# Patient Record
Sex: Male | Born: 1935 | Race: White | Hispanic: No | Marital: Married | Smoking: Current every day smoker
Health system: Southern US, Community
[De-identification: ages and names within clinical notes are randomized; demographics above are authoritative.]

## PROBLEM LIST (undated history)

## (undated) DIAGNOSIS — Z8619 Personal history of other infectious and parasitic diseases: Secondary | ICD-10-CM

## (undated) DIAGNOSIS — Z860100 Personal history of colon polyps, unspecified: Secondary | ICD-10-CM

## (undated) DIAGNOSIS — I10 Essential (primary) hypertension: Secondary | ICD-10-CM

## (undated) DIAGNOSIS — M1990 Unspecified osteoarthritis, unspecified site: Secondary | ICD-10-CM

## (undated) DIAGNOSIS — Z9221 Personal history of antineoplastic chemotherapy: Secondary | ICD-10-CM

## (undated) DIAGNOSIS — K5730 Diverticulosis of large intestine without perforation or abscess without bleeding: Secondary | ICD-10-CM

## (undated) DIAGNOSIS — N400 Enlarged prostate without lower urinary tract symptoms: Secondary | ICD-10-CM

## (undated) DIAGNOSIS — E7800 Pure hypercholesterolemia, unspecified: Secondary | ICD-10-CM

## (undated) DIAGNOSIS — M545 Low back pain, unspecified: Secondary | ICD-10-CM

## (undated) DIAGNOSIS — R7303 Prediabetes: Secondary | ICD-10-CM

## (undated) DIAGNOSIS — R252 Cramp and spasm: Secondary | ICD-10-CM

## (undated) DIAGNOSIS — Z85828 Personal history of other malignant neoplasm of skin: Secondary | ICD-10-CM

## (undated) DIAGNOSIS — I872 Venous insufficiency (chronic) (peripheral): Secondary | ICD-10-CM

## (undated) DIAGNOSIS — J449 Chronic obstructive pulmonary disease, unspecified: Secondary | ICD-10-CM

## (undated) DIAGNOSIS — F17210 Nicotine dependence, cigarettes, uncomplicated: Secondary | ICD-10-CM

## (undated) DIAGNOSIS — G4733 Obstructive sleep apnea (adult) (pediatric): Secondary | ICD-10-CM

## (undated) HISTORY — DX: Prediabetes: R73.03

## (undated) HISTORY — DX: Low back pain, unspecified: M54.50

## (undated) HISTORY — DX: Low back pain: M54.5

## (undated) HISTORY — DX: Chronic obstructive pulmonary disease, unspecified: J44.9

## (undated) HISTORY — DX: Obstructive sleep apnea (adult) (pediatric): G47.33

## (undated) HISTORY — DX: Venous insufficiency (chronic) (peripheral): I87.2

## (undated) HISTORY — DX: Essential (primary) hypertension: I10

## (undated) HISTORY — DX: Nicotine dependence, cigarettes, uncomplicated: F17.210

## (undated) HISTORY — DX: Personal history of other malignant neoplasm of skin: Z85.828

## (undated) HISTORY — DX: Unspecified osteoarthritis, unspecified site: M19.90

## (undated) HISTORY — DX: Personal history of antineoplastic chemotherapy: Z92.21

## (undated) HISTORY — DX: Personal history of colonic polyps: Z86.010

## (undated) HISTORY — PX: APPENDECTOMY: SHX54

## (undated) HISTORY — DX: Benign prostatic hyperplasia without lower urinary tract symptoms: N40.0

## (undated) HISTORY — DX: Personal history of colon polyps, unspecified: Z86.0100

## (undated) HISTORY — DX: Cramp and spasm: R25.2

## (undated) HISTORY — PX: TONSILLECTOMY AND ADENOIDECTOMY: SUR1326

## (undated) HISTORY — DX: Pure hypercholesterolemia, unspecified: E78.00

## (undated) HISTORY — DX: Diverticulosis of large intestine without perforation or abscess without bleeding: K57.30

## (undated) HISTORY — DX: Personal history of other infectious and parasitic diseases: Z86.19

---

## 1995-12-17 HISTORY — PX: LUMBAR LAMINECTOMY: SHX95

## 1996-12-16 HISTORY — PX: OTHER SURGICAL HISTORY: SHX169

## 2000-03-02 ENCOUNTER — Ambulatory Visit: Admission: RE | Admit: 2000-03-02 | Discharge: 2000-03-02 | Payer: Self-pay | Admitting: Pulmonary Disease

## 2000-03-24 ENCOUNTER — Encounter: Admission: RE | Admit: 2000-03-24 | Discharge: 2000-06-22 | Payer: Self-pay | Admitting: Pulmonary Disease

## 2004-10-26 ENCOUNTER — Ambulatory Visit: Payer: Self-pay | Admitting: Pulmonary Disease

## 2004-11-02 ENCOUNTER — Ambulatory Visit: Payer: Self-pay | Admitting: Pulmonary Disease

## 2005-03-05 ENCOUNTER — Ambulatory Visit: Payer: Self-pay | Admitting: Pulmonary Disease

## 2005-03-07 ENCOUNTER — Ambulatory Visit: Payer: Self-pay | Admitting: Pulmonary Disease

## 2005-05-17 ENCOUNTER — Ambulatory Visit: Payer: Self-pay | Admitting: Pulmonary Disease

## 2005-09-05 ENCOUNTER — Ambulatory Visit: Payer: Self-pay | Admitting: Pulmonary Disease

## 2005-11-27 ENCOUNTER — Ambulatory Visit: Payer: Self-pay | Admitting: Pulmonary Disease

## 2005-12-03 ENCOUNTER — Ambulatory Visit: Payer: Self-pay | Admitting: Pulmonary Disease

## 2006-04-04 ENCOUNTER — Ambulatory Visit: Payer: Self-pay | Admitting: Pulmonary Disease

## 2006-12-15 ENCOUNTER — Ambulatory Visit: Payer: Self-pay | Admitting: Pulmonary Disease

## 2006-12-15 LAB — CONVERTED CEMR LAB
Basophils Relative: 1 % (ref 0.0–1.0)
Calcium: 8.6 mg/dL (ref 8.4–10.5)
Chloride: 108 meq/L (ref 96–112)
GFR calc non Af Amer: 87 mL/min
HCT: 46.4 % (ref 39.0–52.0)
Hemoglobin: 15.9 g/dL (ref 13.0–17.0)
Lymphocytes Relative: 28 % (ref 12.0–46.0)
MCHC: 34.3 g/dL (ref 30.0–36.0)
Monocytes Relative: 8.6 % (ref 3.0–11.0)
Neutrophils Relative %: 60.1 % (ref 43.0–77.0)
Platelets: 276 10*3/uL (ref 150–400)
RDW: 12 % (ref 11.5–14.6)
Sodium: 145 meq/L (ref 135–145)
TSH: 1.02 microintl units/mL (ref 0.35–5.50)
Total Bilirubin: 2 mg/dL — ABNORMAL HIGH (ref 0.3–1.2)
Total Protein: 6.5 g/dL (ref 6.0–8.3)
VLDL: 18 mg/dL (ref 0–40)

## 2006-12-18 ENCOUNTER — Ambulatory Visit: Payer: Self-pay | Admitting: Pulmonary Disease

## 2007-06-23 ENCOUNTER — Ambulatory Visit: Payer: Self-pay | Admitting: Pulmonary Disease

## 2007-06-25 ENCOUNTER — Ambulatory Visit: Payer: Self-pay | Admitting: Pulmonary Disease

## 2007-06-25 LAB — CONVERTED CEMR LAB
Alkaline Phosphatase: 56 units/L (ref 39–117)
CO2: 30 meq/L (ref 19–32)
Calcium: 8.5 mg/dL (ref 8.4–10.5)
Glucose, Bld: 125 mg/dL — ABNORMAL HIGH (ref 70–99)
HDL: 28 mg/dL — ABNORMAL LOW (ref >39.0)
Hgb A1c MFr Bld: 6.5 % — ABNORMAL HIGH (ref 4.6–6.0)
LDL Cholesterol: 55 mg/dL (ref 0–99)
Potassium: 3.5 meq/L (ref 3.5–5.1)
Total Bilirubin: 2 mg/dL — ABNORMAL HIGH (ref 0.3–1.2)
Total CHOL/HDL Ratio: 3.6

## 2007-10-19 ENCOUNTER — Ambulatory Visit: Payer: Self-pay | Admitting: Pulmonary Disease

## 2008-03-01 DIAGNOSIS — E7800 Pure hypercholesterolemia, unspecified: Secondary | ICD-10-CM

## 2008-03-01 DIAGNOSIS — J4489 Other specified chronic obstructive pulmonary disease: Secondary | ICD-10-CM | POA: Insufficient documentation

## 2008-03-01 DIAGNOSIS — I872 Venous insufficiency (chronic) (peripheral): Secondary | ICD-10-CM | POA: Insufficient documentation

## 2008-03-01 DIAGNOSIS — I10 Essential (primary) hypertension: Secondary | ICD-10-CM | POA: Insufficient documentation

## 2008-03-01 DIAGNOSIS — M1990 Unspecified osteoarthritis, unspecified site: Secondary | ICD-10-CM | POA: Insufficient documentation

## 2008-03-01 DIAGNOSIS — J449 Chronic obstructive pulmonary disease, unspecified: Secondary | ICD-10-CM

## 2008-03-01 DIAGNOSIS — D126 Benign neoplasm of colon, unspecified: Secondary | ICD-10-CM | POA: Insufficient documentation

## 2008-08-19 ENCOUNTER — Telehealth: Payer: Self-pay | Admitting: Pulmonary Disease

## 2008-08-23 ENCOUNTER — Ambulatory Visit: Payer: Self-pay | Admitting: Pulmonary Disease

## 2008-08-26 ENCOUNTER — Ambulatory Visit: Payer: Self-pay | Admitting: Pulmonary Disease

## 2008-08-26 DIAGNOSIS — R7309 Other abnormal glucose: Secondary | ICD-10-CM

## 2008-08-27 DIAGNOSIS — M545 Low back pain: Secondary | ICD-10-CM

## 2008-08-27 DIAGNOSIS — B029 Zoster without complications: Secondary | ICD-10-CM | POA: Insufficient documentation

## 2008-08-27 DIAGNOSIS — F17200 Nicotine dependence, unspecified, uncomplicated: Secondary | ICD-10-CM

## 2008-08-27 DIAGNOSIS — Z85828 Personal history of other malignant neoplasm of skin: Secondary | ICD-10-CM

## 2008-08-27 DIAGNOSIS — Z87898 Personal history of other specified conditions: Secondary | ICD-10-CM | POA: Insufficient documentation

## 2008-08-27 DIAGNOSIS — R252 Cramp and spasm: Secondary | ICD-10-CM | POA: Insufficient documentation

## 2008-08-27 DIAGNOSIS — K5730 Diverticulosis of large intestine without perforation or abscess without bleeding: Secondary | ICD-10-CM | POA: Insufficient documentation

## 2008-08-27 DIAGNOSIS — G4733 Obstructive sleep apnea (adult) (pediatric): Secondary | ICD-10-CM

## 2008-09-14 LAB — CONVERTED CEMR LAB
ALT: 30 units/L (ref 0–53)
AST: 25 units/L (ref 0–37)
Alkaline Phosphatase: 51 units/L (ref 39–117)
Basophils Absolute: 0.1 10*3/uL (ref 0.0–0.1)
Bilirubin, Direct: 0.2 mg/dL (ref 0.0–0.3)
CO2: 32 meq/L (ref 19–32)
Calcium: 8.7 mg/dL (ref 8.4–10.5)
Chloride: 107 meq/L (ref 96–112)
Cholesterol: 126 mg/dL (ref 0–200)
Creatinine, Ser: 0.9 mg/dL (ref 0.4–1.5)
Eosinophils Absolute: 0.4 10*3/uL (ref 0.0–0.7)
Eosinophils Relative: 3.9 % (ref 0.0–5.0)
GFR calc Af Amer: 105 mL/min
GFR calc non Af Amer: 87 mL/min
HCT: 44 % (ref 39.0–52.0)
Ketones, ur: NEGATIVE mg/dL
LDL Cholesterol: 79 mg/dL (ref 0–99)
Leukocytes, UA: NEGATIVE
Lymphocytes Relative: 31.6 % (ref 12.0–46.0)
Monocytes Relative: 10 % (ref 3.0–12.0)
RDW: 11.8 % (ref 11.5–14.6)
Specific Gravity, Urine: 1.015 (ref 1.000–1.03)
Total CHOL/HDL Ratio: 4.3
Total Protein, Urine: NEGATIVE mg/dL
Total Protein: 6.5 g/dL (ref 6.0–8.3)
Urine Glucose: NEGATIVE mg/dL
Urobilinogen, UA: 0.2 (ref 0.0–1.0)
VLDL: 17 mg/dL (ref 0–40)

## 2008-09-23 ENCOUNTER — Ambulatory Visit: Payer: Self-pay | Admitting: Gastroenterology

## 2008-10-05 ENCOUNTER — Telehealth: Payer: Self-pay | Admitting: Gastroenterology

## 2008-10-07 ENCOUNTER — Ambulatory Visit: Payer: Self-pay | Admitting: Gastroenterology

## 2008-10-07 ENCOUNTER — Encounter: Payer: Self-pay | Admitting: Gastroenterology

## 2008-10-10 ENCOUNTER — Encounter: Payer: Self-pay | Admitting: Gastroenterology

## 2008-10-20 ENCOUNTER — Encounter: Payer: Self-pay | Admitting: Pulmonary Disease

## 2008-11-24 ENCOUNTER — Ambulatory Visit: Payer: Self-pay | Admitting: Pulmonary Disease

## 2009-08-18 ENCOUNTER — Ambulatory Visit: Payer: Self-pay | Admitting: Pulmonary Disease

## 2009-08-18 DIAGNOSIS — H6120 Impacted cerumen, unspecified ear: Secondary | ICD-10-CM | POA: Insufficient documentation

## 2009-09-19 ENCOUNTER — Encounter: Payer: Self-pay | Admitting: Pulmonary Disease

## 2009-10-26 ENCOUNTER — Telehealth: Payer: Self-pay | Admitting: Pulmonary Disease

## 2010-04-17 ENCOUNTER — Telehealth (INDEPENDENT_AMBULATORY_CARE_PROVIDER_SITE_OTHER): Payer: Self-pay | Admitting: *Deleted

## 2010-04-18 ENCOUNTER — Ambulatory Visit: Payer: Self-pay | Admitting: Pulmonary Disease

## 2010-04-18 ENCOUNTER — Ambulatory Visit: Payer: Self-pay | Admitting: Internal Medicine

## 2010-04-20 ENCOUNTER — Ambulatory Visit: Payer: Self-pay | Admitting: Pulmonary Disease

## 2010-04-22 LAB — CONVERTED CEMR LAB
Alkaline Phosphatase: 51 units/L (ref 39–117)
Basophils Relative: 0.8 % (ref 0.0–3.0)
Bilirubin, Direct: 0.2 mg/dL (ref 0.0–0.3)
Calcium: 8.5 mg/dL (ref 8.4–10.5)
Chloride: 104 meq/L (ref 96–112)
Cholesterol: 134 mg/dL (ref 0–200)
Eosinophils Absolute: 0.3 10*3/uL (ref 0.0–0.7)
Eosinophils Relative: 2.8 % (ref 0.0–5.0)
HDL: 36.4 mg/dL — ABNORMAL LOW (ref >39.00)
Ketones, ur: NEGATIVE mg/dL
LDL Cholesterol: 77 mg/dL (ref 0–99)
Leukocytes, UA: NEGATIVE
MCHC: 34.5 g/dL (ref 30.0–36.0)
MCV: 93.2 fL (ref 78.0–100.0)
Neutro Abs: 5.3 10*3/uL (ref 1.4–7.7)
Neutrophils Relative %: 55 % (ref 43.0–77.0)
PSA: 0.41 ng/mL (ref 0.10–4.00)
RBC: 4.88 M/uL (ref 4.22–5.81)
RDW: 12.9 % (ref 11.5–14.6)
Sodium: 142 meq/L (ref 135–145)
Total Bilirubin: 1.5 mg/dL — ABNORMAL HIGH (ref 0.3–1.2)
Total CHOL/HDL Ratio: 4
Total Protein: 6.7 g/dL (ref 6.0–8.3)
Triglycerides: 101 mg/dL (ref 0.0–149.0)
WBC: 9.6 10*3/uL (ref 4.5–10.5)
pH: 7 (ref 5.0–8.0)

## 2010-09-14 ENCOUNTER — Ambulatory Visit: Payer: Self-pay | Admitting: Pulmonary Disease

## 2011-01-17 NOTE — Assessment & Plan Note (Signed)
Summary: flu shot/la   Nurse Visit   Allergies: 1)  ! Quinine Hcl Dihydrate (Quinine Hcl Dihydrate) 2)  ! Niacin Cr (Niacin)  Orders Added: 1)  Flu Vaccine 74yrs + MEDICARE PATIENTS [Q2039] 2)  Administration Flu vaccine - MCR [G0008]      Flu Vaccine Consent Questions     Do you have a history of severe allergic reactions to this vaccine? no    Any prior history of allergic reactions to egg and/or gelatin? no    Do you have a sensitivity to the preservative Thimersol? no    Do you have a past history of Guillan-Barre Syndrome? no    Do you currently have an acute febrile illness? no    Have you ever had a severe reaction to latex? no    Vaccine information given and explained to patient? yes    Are you currently pregnant? no    Lot Number:[id number]   Exp Date:06/15/2011   Site Given  Right Deltoid IM Abigail Miyamoto RN  September 14, 2010 12:23 PM

## 2011-01-17 NOTE — Assessment & Plan Note (Signed)
Summary: needs refills/la   CC:  2 year ROV & review of mult medical problems....  History of Present Illness: 77 y/o WM here for a follow up visit... he has multiple medical problems as noted below...    ~ Sep09:  he states he is feeling well overall- still plays tennis 2x per week, chr stable DOE w/o change x yrs, still smoking  ~ 1ppd and can't/ won't quit- no interest in smoking cessation help...   ~  Apr 18, 2010:  remarkably stable & still smokes 1ppd, playing tennis regularly in a league & no c/o dyspnea, CP, etc...  BP controlled on meds;  due for f/u FLP on his Simva40- taking 1/2 tab daily;  BPH followed by DrKimbrough &he reports PSA's have been normal;  notes some DJD/ LBP & saw DrRendall 1 mo ago w/ Dosepak that resolved the issue... no new complaints or concerns x some "muscle spasm" in feet- offered Soma Rx but he declines new medications & will let us know.    Current Problem List:  Hx of OBSTRUCTIVE SLEEP APNEA (ICD-327.23) - hx mild OSA on sleep study 2001w/ RDI= 14 and desat to 85%... eval DrClance- rec weight loss & exerc, not on CPAP... he notes that he rests well & denies daytime hypersomnolence, snoring, etc...  COPD (ICD-496) & CIGARETTE SMOKER (ICD-305.1) - not currently on any meds... longtime smoker  ~ 1ppd w/ min cough/ phlegm/ DOE without change... he refuses smoking cessation help stating that he got depressed when he tried quitting in the past... baseline CXR without acute changes, and prev PFT's 2/05 showed FVC= 2.88 (75%), FEV1= 2.20 (72%), %1sec=76, mid-flows= 55% pred...   ~  CXR 5/11 showed atherosclerotic changes, sl incr markings, DJD sp, NAD...  HYPERTENSION (ICD-401.9) - controlled on AMLODIPINE 5mg /d, & KCl Bid... BP= 126/62 today and asymptomatic- not checking BP at home... denies HA, fatigue, visual changes, CP, palipit, dizziness, syncope, dyspnea, edema, etc... he plays tennis 2x/wk...  VENOUS INSUFFICIENCY (ICD-459.81) - he follows a low  sodium diet, elevates when able and wears support hose as needed.  HYPERCHOLESTEROLEMIA (ICD-272.0) - on SIMVASTATIN 40mg /d- taking 1/2 tab daily...   ~  FLP 7/08 on Vytorin10-20 showed TChol 102, TG 94, HDL 28, LDL 55  ~  FLP 9/09 on Simvast40-1/2 showed TChol 126, TG 86, HDL 30, LDL 79... rec- same med.  ~  FLP 5/11 on Simva40-1/2 showed   DIABETES MELLITUS, BORDERLINE (ICD-790.29) - on diet alone...  ~  labs 7/08 showed BS= 125, HgA1c= 6.5  ~  labs 9/09 showed BS= 133, HgA1c= 6.2  ~  labs 5/11 showed   DIVERTICULOSIS OF COLON (ICD-562.10) & COLONIC POLYPS (ICD-211.3) -   ~  colonoscopy 9/04 showed divertics & several 4-52mm polyps= hyperpl and tubular adenomas...  ~  f/u colonoscopy 10/09 by DrStark showed divertics, 3mm polyp= tubular adenoma, +hems... ?f/u 37yr.  BENIGN PROSTATIC HYPERTROPHY, HX OF (ICD-V13.8) - followed by DrKimbrough et al... last note from Urology= 11/09 w/ Dx= Renal cyst & Obsrtuctive Uropathy (mild BPH) & +FamHx prostate Ca in father... hx mild hematuria w/ cysto in 2007 showing a sm diverticulum off the left base of the bladder...  DEGENERATIVE JOINT DISEASE (ICD-715.90) - uses OTC NSAIDs as needed... he saw DrRendall in the 90's w/ knee symptoms.  BACK PAIN, LUMBAR (ICD-724.2) - hx of HNP/ sp stenosis... s/p lumbar lam 1997 by DrRobinson...  ~  5/11: he reports LBP  ~82mo ago Rx by Toy Baker w/ Dosepak & symptoms  resolved.  Hx of LEG CRAMPS (ICD-729.82) - he uses KCl w/ relief (2 tabs daily)... hx of rash from Quinine...  SKIN CANCER, HX OF (ICD-V10.83) - s/p basal cell on scalp, Dr Donzetta Starch...  Hx of SHINGLES - right T4 distrib shingles in 2000...   Allergies: 1)  ! Quinine Hcl Dihydrate (Quinine Hcl Dihydrate) 2)  ! Niacin Cr (Niacin)  Comments:  Nurse/Medical Assistant: The patient's medications and allergies were reviewed with the patient and were updated in the Medication and Allergy Lists.  Past History:  Past Medical History: Hx of  OBSTRUCTIVE SLEEP APNEA (ICD-327.23) COPD (ICD-496) CIGARETTE SMOKER (ICD-305.1) HYPERTENSION (ICD-401.9) VENOUS INSUFFICIENCY (ICD-459.81) HYPERCHOLESTEROLEMIA (ICD-272.0) DIABETES MELLITUS, BORDERLINE (ICD-790.29) DIVERTICULOSIS OF COLON (ICD-562.10) COLONIC POLYPS (ICD-211.3) BENIGN PROSTATIC HYPERTROPHY, HX OF (ICD-V13.8) DEGENERATIVE JOINT DISEASE (ICD-715.90) BACK PAIN, LUMBAR (ICD-724.2) Hx of LEG CRAMPS (ICD-729.82) SKIN CANCER, HX OF (ICD-V10.83) Hx of SHINGLES (ICD-053.9)  Past Surgical History: S/P appendectomy S/P T & A S/P left inguinal hernia repair 1998 DrTDavis S/P lumbar laminectomy 1997 by DrRobinson  Family History: Reviewed history from 08/18/2009 and no changes required. cancer- mother (unknown) father died of old age 59 older sister, healthy 1 younger brother, healthy  Social History: Reviewed history from 08/18/2009 and no changes required. 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    Vital Signs:  Patient profile:   77 year old male Height:      69.5 inches Weight:      182 pounds O2 Sat:      96 % on Room air Temp:     98.1 degrees F oral Pulse rate:   73 / minute BP sitting:   126 / 62  (right arm) Cuff size:   regular  Vitals Entered By: Randell Loop CMA (Apr 18, 2010 2:52 PM)  O2 Sat at Rest %:  96 O2 Flow:  Room  air CC: 2 year ROV & review of mult medical problems... Is Patient Diabetic? No Pain Assessment Patient in pain? no      Comments meds updated today   Physical Exam  Additional Exam:  WD, WN, 77 y/o WM in NAD...  GENERAL:  Alert & oriented; pleasant & cooperative... HEENT:  /AT, EOM-wnl, PERRLA, EACs-clear, TMs-wnl, NOSE-clear, THROAT-clear & wnl. NECK:  Supple w/ fairROM; no JVD; normal carotid impulses w/o bruits; no thyromegaly or nodules palpated; no lymphadenopathy. CHEST:  Clear to P & A; without wheezes/ rales/ or rhonchi heard... HEART:  Regular Rhythm; without murmurs/ rubs/ or gallops detected... ABDOMEN:  Soft & nontender; normal bowel sounds; no organomegaly or masses palpated... He has a "touchey" abd and is difficult to examine... RECTAL:  Neg - prostate 3+ & nontender w/o nodules; stool hematest neg. EXT: without deformities, mild arthritic changes; no varicose veins/ venous insuffic/ or edema. NEURO:  CN's intact; motor testing normal; sensory testing normal; gait normal & balance OK. DERM:  No lesions noted; no rash etc...    CXR  Procedure date:  04/18/2010  Findings:  CHEST - 2 VIEW Comparison: 08/26/2008 study   Findings: The cardiac silhouette is normal size and shape. Ectasia and nonaneurysmal calcification of the thoracic aorta is seen. No pulmonary edema, pneumonia, or pleural effusion is seen.  There is minimal central peribronchial thickening.  Osteophytes are present in the spine.   IMPRESSION: There is central peribronchial thickening with no peripheral infiltrate or consolidation evident.   Read By:  Crawford Givens,  M.D.   MISC. Report  Procedure date:  04/18/2010  Findings:      He will return to our lab for FASTING blood work & we will communicate the results to him...  SN   Impression & Recommendations:  Problem # 1:  COPD (ICD-496) 2 yr ROV- still smoking 1ppd & he has no intention of quitting, declines smoking cessation  help, etc... breathing has been good w/o change DOE, plays tennis regularly etc... CXR w/o acute changes... Orders: T-2 View CXR (71020TC)  Problem # 2:  Hx of OBSTRUCTIVE SLEEP APNEA (ICD-327.23) Denies problems, resting well he says & no sleep apnea issues... doesn't want CPAP, or repeat eval etc...  Problem # 3:  HYPERTENSION (ICD-401.9) Controlled-  same meds. His updated medication list for this problem includes:    Norvasc 5 Mg Tabs (Amlodipine besylate) .Marland Kitchen... Take 1 tablet by mouth once a day  Orders: Prescription Created Electronically [phone])  Problem # 4:  HYPERCHOLESTEROLEMIA (ICD-272.0) Due for f/u FLP... continue same med for now. His updated medication list for this problem includes:    Simvastatin 40 Mg Tabs (Simvastatin) .Marland Kitchen... Take as directed...  Problem # 5:  DIABETES MELLITUS, BORDERLINE (ICD-790.29) Due for f/u labs... continue diet + exercise for now...  Problem # 6:  COLONIC POLYPS (ICD-211.3) GI is stable and up to date...  Problem # 7:  BENIGN PROSTATIC HYPERTROPHY, HX OF (ICD-V13.8) Followed by DrKimbrough... we will check PSA & send copy to Urology.  Problem # 8:  DEGENERATIVE JOINT DISEASE (ICD-715.90) He uses OTC meds Prn... plays tennis regularly... had some LBP resolved after Dosepak by DrRendall... His updated medication list for this problem includes:    Adult Aspirin Low Strength 81 Mg Tbdp (Aspirin) .Marland Kitchen... Take 1 tablet by mouth once a day  Problem # 9:  OTHER MEDICAL PROBLEMS AS NOTED>>> We spent extra time discussing smoking cessation today...  Complete Medication List: 1)  Adult Aspirin Low Strength 81 Mg Tbdp (Aspirin) .... Take 1 tablet by mouth once a day 2)  Norvasc 5 Mg Tabs (Amlodipine besylate) .... Take 1 tablet by mouth once a day 3)  Klor-con M20 20 Meq Tbcr (Potassium chloride crys cr) .... Take 1 tab by mouth two times a day... 4)  Simvastatin 40 Mg Tabs (Simvastatin) .... Take as directed... 5)  Multivitamins Tabs (Multiple  vitamin) .... Take 1 tablet by mouth once a day 6)  Vitamin C 500 Mg Tabs (Ascorbic acid) .... Take 1 tablet by mouth once a day 7)  Xalatan 0.005 % Soln (Latanoprost) .... One drop in left eye once daily  Patient Instructions: 1)  Today we updated your med list- see below.... 2)  We refilled your meds for 2011... 3)  Today we did your follow up CXR.Marland KitchenMarland Kitchen  4)  PLEASE PLEase please quit smoking!!! 5)  Please return to our lab one morning this week for your FASTING blood work... then call the "phone tree" in a few days for your lab results.Marland KitchenMarland Kitchen 6)  Keep up the good work w/ diet & exercise... 7)  Call  for any problems... 8)  Please schedule a follow-up appointment in 1 year, sooner as needed... Prescriptions: SIMVASTATIN 40 MG TABS (SIMVASTATIN) take as directed...  #30 x prn   Entered and Authorized by:   Michele Mcalpine MD   Signed by:   Michele Mcalpine MD on 04/18/2010   Method used:   Print then Give to Patient   RxID:   [id number] KLOR-CON M20 20 MEQ  TBCR (POTASSIUM CHLORIDE CRYS CR) take 1 tab by mouth two times a day...  #60 x prn   Entered and Authorized by:   Michele Mcalpine MD   Signed by:   Michele Mcalpine MD on 04/18/2010   Method used:   Print then Give to Patient   RxID:   [id number] NORVASC 5 MG  TABS (AMLODIPINE BESYLATE) Take 1 tablet by mouth once a day  #30 x prn   Entered and Authorized by:   Michele Mcalpine MD   Signed by:   Michele Mcalpine MD on 04/18/2010   Method used:   Print then Give to Patient   RxID:   [id number]    Immunization History:  Influenza Immunization History:    Influenza:  historical (10/25/2009)

## 2011-01-17 NOTE — Progress Notes (Signed)
Summary: needs appt w/ sn  Phone Note Call from Patient   Caller: Patient Call For: nadel Summary of Call: pt needs f/u for refills. 1st avail is middle of aug- do i schedule him w/ tp and then let him schedule w/ sn for aug or what? let me know and i will call pt back. thanks. kp Initial call taken by: Tivis Ringer, CNA,  Apr 17, 2010 12:20 PM  Follow-up for Phone Call        called and spoke with pt and made appt with SN for 5-4 at 3pm.  pt needs refills of meds. Randell Loop CMA  Apr 17, 2010 1:34 PM

## 2011-05-10 ENCOUNTER — Other Ambulatory Visit: Payer: Self-pay | Admitting: Pulmonary Disease

## 2011-05-10 MED ORDER — AMLODIPINE BESYLATE 5 MG PO TABS: 5.0000 mg | ORAL_TABLET | Freq: Every day | ORAL | Status: DC

## 2011-05-10 NOTE — Telephone Encounter (Signed)
Spoke w/ pt and he states he is needing refill on his norvasc [facility] [address]. Advised pt will get rx sent for him. Nothing further was needed. Advised pt of his upcoming apt w/ SN  on 5/29 at 10:00 for 1 year follow up

## 2011-05-14 ENCOUNTER — Ambulatory Visit (INDEPENDENT_AMBULATORY_CARE_PROVIDER_SITE_OTHER): Payer: Medicare Other | Admitting: Pulmonary Disease

## 2011-05-14 ENCOUNTER — Ambulatory Visit (INDEPENDENT_AMBULATORY_CARE_PROVIDER_SITE_OTHER)
Admission: RE | Admit: 2011-05-14 | Discharge: 2011-05-14 | Disposition: A | Discharge status: Home / Self Care | Payer: Medicare Other | Source: Ambulatory Visit | Attending: Pulmonary Disease | Admitting: Pulmonary Disease

## 2011-05-14 ENCOUNTER — Encounter: Payer: Self-pay | Admitting: Pulmonary Disease

## 2011-05-14 DIAGNOSIS — Z85828 Personal history of other malignant neoplasm of skin: Secondary | ICD-10-CM

## 2011-05-14 DIAGNOSIS — J449 Chronic obstructive pulmonary disease, unspecified: Secondary | ICD-10-CM

## 2011-05-14 DIAGNOSIS — K5730 Diverticulosis of large intestine without perforation or abscess without bleeding: Secondary | ICD-10-CM

## 2011-05-14 DIAGNOSIS — D126 Benign neoplasm of colon, unspecified: Secondary | ICD-10-CM

## 2011-05-14 DIAGNOSIS — M1990 Unspecified osteoarthritis, unspecified site: Secondary | ICD-10-CM

## 2011-05-14 DIAGNOSIS — R7309 Other abnormal glucose: Secondary | ICD-10-CM

## 2011-05-14 DIAGNOSIS — I10 Essential (primary) hypertension: Secondary | ICD-10-CM

## 2011-05-14 DIAGNOSIS — Z87898 Personal history of other specified conditions: Secondary | ICD-10-CM

## 2011-05-14 DIAGNOSIS — E7800 Pure hypercholesterolemia, unspecified: Secondary | ICD-10-CM

## 2011-05-14 DIAGNOSIS — R252 Cramp and spasm: Secondary | ICD-10-CM

## 2011-05-14 DIAGNOSIS — I872 Venous insufficiency (chronic) (peripheral): Secondary | ICD-10-CM

## 2011-05-14 DIAGNOSIS — F419 Anxiety disorder, unspecified: Secondary | ICD-10-CM

## 2011-05-14 MED ORDER — AMLODIPINE BESYLATE 5 MG PO TABS: 5.0000 mg | ORAL_TABLET | Freq: Every day | ORAL | Status: DC

## 2011-05-14 MED ORDER — SIMVASTATIN 40 MG PO TABS: 40.0000 mg | ORAL_TABLET | Freq: Every day | ORAL | Status: DC

## 2011-05-14 MED ORDER — POTASSIUM CHLORIDE CRYS ER 20 MEQ PO TBCR: 20.0000 meq | EXTENDED_RELEASE_TABLET | Freq: Two times a day (BID) | ORAL | Status: DC

## 2011-05-14 NOTE — Patient Instructions (Signed)
Today we updated your med list in EPIC...    We refilled your meds for 90d supplies as requested...  Today we did your follow up CXR> Please return to our lab one morning this week for your fasting blood work...    Then call the PHONE TREE in a few days for your results...    Dial [id number] & when prompted enter your patient number followed by the # symbol...    Your patient number is:  [id number]  YOU NEED TO QUIT THE SMOKING- For the love of God, Nadine Counts, at least cut back some!!!  Call for any questions.Marland KitchenMarland Kitchen

## 2011-05-14 NOTE — Progress Notes (Signed)
Subjective:    Patient ID: Cory [name], male    DOB: 05-18-1929, 77 y.o.   MRN: [id number]  HPI 77 y/o WM here for a follow up visit... he has multiple medical problems as noted below...   ~  Apr 18, 2010:  remarkably stable & still smokes 1ppd, playing tennis regularly in a league & no c/o dyspnea, CP, etc...  BP controlled on meds;  due for f/u FLP on his Simva40- taking 1/2 tab daily;  BPH followed by DrKimbrough &he reports PSA's have been normal;  notes some DJD/ LBP & saw DrRendall 1 mo ago w/ Dosepak that resolved the issue... no new complaints or concerns x some "muscle spasm" in feet- offered Soma Rx but he declines new medications & will let us know.  ~  May 14, 2011:  Yearly ROV & he reports stable, doing well he says... Still smoking 1-1.5ppd w/ min cough/ phlegm/ dyspnea; plays tennis regularly w/o deterioration etc;   He notes some nocturnal leg cramps relieved by a banana & we discussed quinine vs mustard Qhs;  Due for f/u CXR & will ret for fasting blood work...  meds refilled for 90d supplies per request... CXR is OK today w/o lesions;  Labs are OK as well but K=3.4 & he's supposed to be on K20Bid already...          Problem List:  GLAUCOMA - on drops per DrCashwell...  Hx of OBSTRUCTIVE SLEEP APNEA (ICD-327.23) - hx mild OSA on sleep study 2001w/ RDI= 14 and desat to 85%... eval DrClance- rec weight loss & exerc, not on CPAP... he notes that he rests well & denies daytime hypersomnolence, snoring, etc...  COPD (ICD-496) & CIGARETTE SMOKER (ICD-305.1) - not currently on any meds... longtime smoker ~ 1-1.5 ppd w/ min cough/ phlegm/ DOE without change... he refuses smoking cessation help stating that he got depressed when he tried quitting in the past... baseline CXR without acute changes, and prev PFT's 2/05 showed FVC= 2.88 (75%), FEV1= 2.20 (72%), %1sec=76, mid-flows= 55% pred...  ~  CXR 5/11 showed atherosclerotic changes, sl incr markings, DJD sp, NAD. ~  CXR 5/12 showed  clear lungs, NAD.  HYPERTENSION (ICD-401.9) - controlled on ASA 81mg /d, AMLODIPINE 5mg /d, & KCl Bid... BP= 122/70 today and asymptomatic- not checking BP at home... denies HA, fatigue, visual changes, CP, palipit, dizziness, syncope, dyspnea, edema, etc... he plays tennis 2x/wk...  VENOUS INSUFFICIENCY (ICD-459.81) - he follows a low sodium diet, elevates when able and wears support hose as needed.  HYPERCHOLESTEROLEMIA (ICD-272.0) - on SIMVASTATIN 40mg /d- taking 1/2 tab daily & he added Niacin 250mg /d... ~  FLP 7/08 on Vytorin10-20 showed TChol 102, TG 94, HDL 28, LDL 55 ~  FLP 9/09 on Simvast40-1/2 showed TChol 126, TG 86, HDL 30, LDL 79... rec- same med. ~  FLP 5/11 on Simva40-1/2 showed TChol 134, TG 101, HDL 36, LDL 77 ~  FLP 5/12 on Simva40- 1/2 showed TChol 106, TG 92, HDL 40, LDL 48  DIABETES MELLITUS, BORDERLINE (ICD-790.29) - on diet alone... ~  labs 7/08 showed BS= 125, HgA1c= 6.5 ~  labs 9/09 showed BS= 133, HgA1c= 6.2 ~  labs 5/11 showed BS= 111, A1c= 6.4 ~  Labs 5/12 showed BS= 108  DIVERTICULOSIS OF COLON (ICD-562.10) & COLONIC POLYPS (ICD-211.3) -  ~  colonoscopy 9/04 showed divertics & several 4-48mm polyps= hyperpl and tubular adenomas... ~  f/u colonoscopy 10/09 by DrStark showed divertics, 3mm polyp= tubular adenoma, +hems... ?f/u 34yr.  BENIGN  PROSTATIC HYPERTROPHY, HX OF (ICD-V13.8) - followed by DrKimbrough et al... last note from Urology= 11/09 w/ Dx= Renal cyst & Obsrtuctive Uropathy (mild BPH) & +FamHx prostate Ca in father... hx mild hematuria w/ cysto in 2007 showing a sm diverticulum off the left base of the bladder... ~  Labs 5/11 showed PSA= 0.41 ~  He tells me that DrKimbrough stopped doing PSAs & he's OK w/ this...  DEGENERATIVE JOINT DISEASE (ICD-715.90) - uses OTC NSAIDs as needed... he saw DrRendall in the 90's w/ knee symptoms.  VITAMIN D DEFICIENCY - on Vit D 2000 u daily... ~  Labs 5/11 showed Vit D level = 18... rec to start 2000u OTC  supplement daily. ~  Labs 5/12 showed Vit D level = 39... Continue daily supplement   BACK PAIN, LUMBAR (ICD-724.2) - hx of HNP/ sp stenosis... s/p lumbar lam 1997 by DrRobinson... ~  5/11: he reports LBP ~79mo ago Rx by DrRendall w/ Dosepak & symptoms resolved.  Hx of LEG CRAMPS (ICD-729.82) - he uses KCl w/ relief (2 tabs daily) + Bananas... hx of rash from Quinine...  SKIN CANCER, HX OF (ICD-V10.83) - s/p basal cell on scalp, Dr Donzetta Starch...  Hx of SHINGLES - right T4 distrib shingles in 2000...   Past Surgical History  Procedure Date  . Appendectomy   . Tonsillectomy and adenoidectomy   . Left inguinal hernia repair 1998    Dr. Earlene Plater  . Lumbar laminectomy 1997    Dr. Roxan Hockey    Outpatient Encounter Prescriptions as of 05/14/2011  Medication Sig Dispense Refill  . amLODipine (NORVASC) 5 MG tablet Take 1 tablet (5 mg total) by mouth daily.  30 tablet  6  . aspirin 81 MG tablet Take 81 mg by mouth daily.        . Cholecalciferol (VITAMIN D) 2000 UNITS CAPS Take 1 capsule by mouth daily.        Marland Kitchen latanoprost (XALATAN) 0.005 % ophthalmic solution Place 1 drop into the left eye at bedtime.        . Multiple Vitamin (MULTIVITAMIN) capsule Take 1 capsule by mouth daily.        . niacin 250 MG tablet Take 250 mg by mouth daily with breakfast.        . potassium chloride SA (KLOR-CON M20) 20 MEQ tablet Take 20 mEq by mouth 2 (two) times daily.        . simvastatin (ZOCOR) 40 MG tablet Take 40 mg by mouth at bedtime.        . vitamin C (ASCORBIC ACID) 500 MG tablet Take 500 mg by mouth daily.          Allergies  Allergen Reactions  . Niacin     REACTION: Intol to Niacin w/ flushing in large doses    Review of Systems       See HPI - all other systems neg except as noted... The patient denies anorexia, fever, weight loss, weight gain, vision loss, decreased hearing, hoarseness, chest pain, syncope, dyspnea on exertion, peripheral edema, prolonged cough, headaches,  hemoptysis, abdominal pain, melena, hematochezia, severe indigestion/heartburn, hematuria, incontinence, muscle weakness, suspicious skin lesions, transient blindness, difficulty walking, depression, unusual weight change, abnormal bleeding, enlarged lymph nodes, and angioedema.     Objective:   Physical Exam     WD, WN, 77 y/o WM in NAD...  GENERAL:  Alert & oriented; pleasant & cooperative... HEENT:  [place]/AT, EOM-wnl, PERRLA, EACs-clear, TMs-wnl, NOSE-clear, THROAT-clear & wnl. NECK:  Supple w/  fairROM; no JVD; normal carotid impulses w/o bruits; no thyromegaly or nodules palpated; no lymphadenopathy. CHEST:  Clear to P & A; without wheezes/ rales/ or rhonchi heard... HEART:  Regular Rhythm; without murmurs/ rubs/ or gallops detected... ABDOMEN:  Soft & nontender; normal bowel sounds; no organomegaly or masses palpated... He has a "touchey" abd and is difficult to examine... RECTAL:  Neg - prostate 3+ & nontender w/o nodules; stool hematest neg. EXT: without deformities, mild arthritic changes; no varicose veins/ venous insuffic/ or edema. NEURO:  CN's intact; motor testing normal; sensory testing normal; gait normal & balance OK. DERM:  No lesions noted; no rash etc...   Assessment & Plan:   COPD/ Cig Smoker> he is asymptomatic but still smoking >1ppd, not interestred in smoking cessation help etc, I have begged him to quit... CXR remains WNL at age 47 now.  HBP>  Controlled on meds, continue same + low sodium etc...  CHOL>  Looks great on diet + Simva20...  DM>  Remains diet controlled...  GI>  Followed by DrStark & up to date...  DJD> still manages to play tennis etc..  Leg Cramps>  He uses the KCl + bananas etc...  Other medical issues as noted.Marland KitchenMarland Kitchen

## 2011-05-16 ENCOUNTER — Other Ambulatory Visit (INDEPENDENT_AMBULATORY_CARE_PROVIDER_SITE_OTHER): Payer: Medicare Other

## 2011-05-16 DIAGNOSIS — F411 Generalized anxiety disorder: Secondary | ICD-10-CM

## 2011-05-16 DIAGNOSIS — E7800 Pure hypercholesterolemia, unspecified: Secondary | ICD-10-CM

## 2011-05-16 DIAGNOSIS — I10 Essential (primary) hypertension: Secondary | ICD-10-CM

## 2011-05-16 DIAGNOSIS — D126 Benign neoplasm of colon, unspecified: Secondary | ICD-10-CM

## 2011-05-16 DIAGNOSIS — F419 Anxiety disorder, unspecified: Secondary | ICD-10-CM

## 2011-05-16 LAB — HEPATIC FUNCTION PANEL
AST: 21 U/L (ref 0–37)
Total Bilirubin: 1.8 mg/dL — ABNORMAL HIGH (ref 0.3–1.2)

## 2011-05-16 LAB — BASIC METABOLIC PANEL
BUN: 17 mg/dL (ref 6–23)
Calcium: 8.8 mg/dL (ref 8.4–10.5)
GFR: 92.22 mL/min (ref >60.00)
Potassium: 3.4 mEq/L — ABNORMAL LOW (ref 3.5–5.1)
Sodium: 143 mEq/L (ref 135–145)

## 2011-05-16 LAB — LIPID PANEL
Cholesterol: 106 mg/dL (ref 0–200)
LDL Cholesterol: 48 mg/dL (ref 0–99)
VLDL: 18.4 mg/dL (ref 0.0–40.0)

## 2011-05-16 LAB — CBC WITH DIFFERENTIAL/PLATELET
Basophils Relative: 1.1 % (ref 0.0–3.0)
Eosinophils Relative: 3.1 % (ref 0.0–5.0)
HCT: 43.4 % (ref 39.0–52.0)
Hemoglobin: 14.8 g/dL (ref 13.0–17.0)
Lymphs Abs: 3 10*3/uL (ref 0.7–4.0)
MCV: 93.6 fl (ref 78.0–100.0)
Monocytes Absolute: 1 10*3/uL (ref 0.1–1.0)
Neutro Abs: 5.1 10*3/uL (ref 1.4–7.7)
Platelets: 227 10*3/uL (ref 150.0–400.0)
WBC: 9.4 10*3/uL (ref 4.5–10.5)

## 2011-05-19 ENCOUNTER — Emergency Department (HOSPITAL_COMMUNITY): Payer: Medicare Other

## 2011-05-19 ENCOUNTER — Emergency Department (HOSPITAL_COMMUNITY)
Admission: EM | Admit: 2011-05-19 | Discharge: 2011-05-20 | Disposition: A | Discharge status: Home / Self Care | Payer: Medicare Other | Attending: Emergency Medicine | Admitting: Emergency Medicine

## 2011-05-19 ENCOUNTER — Encounter: Payer: Self-pay | Admitting: Pulmonary Disease

## 2011-05-19 DIAGNOSIS — M25559 Pain in unspecified hip: Secondary | ICD-10-CM | POA: Insufficient documentation

## 2011-05-19 DIAGNOSIS — M545 Low back pain, unspecified: Secondary | ICD-10-CM | POA: Insufficient documentation

## 2011-05-19 DIAGNOSIS — G8929 Other chronic pain: Secondary | ICD-10-CM | POA: Insufficient documentation

## 2011-05-19 DIAGNOSIS — IMO0002 Reserved for concepts with insufficient information to code with codable children: Secondary | ICD-10-CM | POA: Insufficient documentation

## 2011-05-19 DIAGNOSIS — I10 Essential (primary) hypertension: Secondary | ICD-10-CM | POA: Insufficient documentation

## 2011-05-19 DIAGNOSIS — R209 Unspecified disturbances of skin sensation: Secondary | ICD-10-CM | POA: Insufficient documentation

## 2011-05-19 LAB — URINALYSIS, ROUTINE W REFLEX MICROSCOPIC
Bilirubin Urine: NEGATIVE
Glucose, UA: NEGATIVE mg/dL
Hgb urine dipstick: NEGATIVE
Specific Gravity, Urine: 1.014 (ref 1.005–1.030)
pH: 7.5 (ref 5.0–8.0)

## 2011-10-24 ENCOUNTER — Ambulatory Visit (INDEPENDENT_AMBULATORY_CARE_PROVIDER_SITE_OTHER): Payer: Medicare Other

## 2011-10-24 ENCOUNTER — Encounter: Payer: Self-pay | Admitting: Gastroenterology

## 2011-10-24 DIAGNOSIS — Z23 Encounter for immunization: Secondary | ICD-10-CM

## 2011-11-26 ENCOUNTER — Other Ambulatory Visit: Payer: Self-pay | Admitting: *Deleted

## 2011-11-26 MED ORDER — POTASSIUM CHLORIDE CRYS ER 20 MEQ PO TBCR: 20.0000 meq | EXTENDED_RELEASE_TABLET | Freq: Two times a day (BID) | ORAL | Status: DC

## 2012-01-08 ENCOUNTER — Other Ambulatory Visit: Payer: Self-pay | Admitting: *Deleted

## 2012-01-08 MED ORDER — AMLODIPINE BESYLATE 5 MG PO TABS: 5.0000 mg | ORAL_TABLET | Freq: Every day | ORAL | Status: DC

## 2012-01-20 ENCOUNTER — Other Ambulatory Visit: Payer: Self-pay | Admitting: *Deleted

## 2012-01-20 MED ORDER — POTASSIUM CHLORIDE CRYS ER 20 MEQ PO TBCR: 20.0000 meq | EXTENDED_RELEASE_TABLET | Freq: Two times a day (BID) | ORAL | Status: DC

## 2012-04-13 IMAGING — CR DG SACRUM/COCCYX 2+V
3 series · 3 of 3 positions shown · non-contrast
Comparison: None.

CLINICAL DATA: Severe right hip and low back pain in the tail bone.
No injury.

SACRUM AND COCCYX - 2+ VIEW

[t sacrum lat]
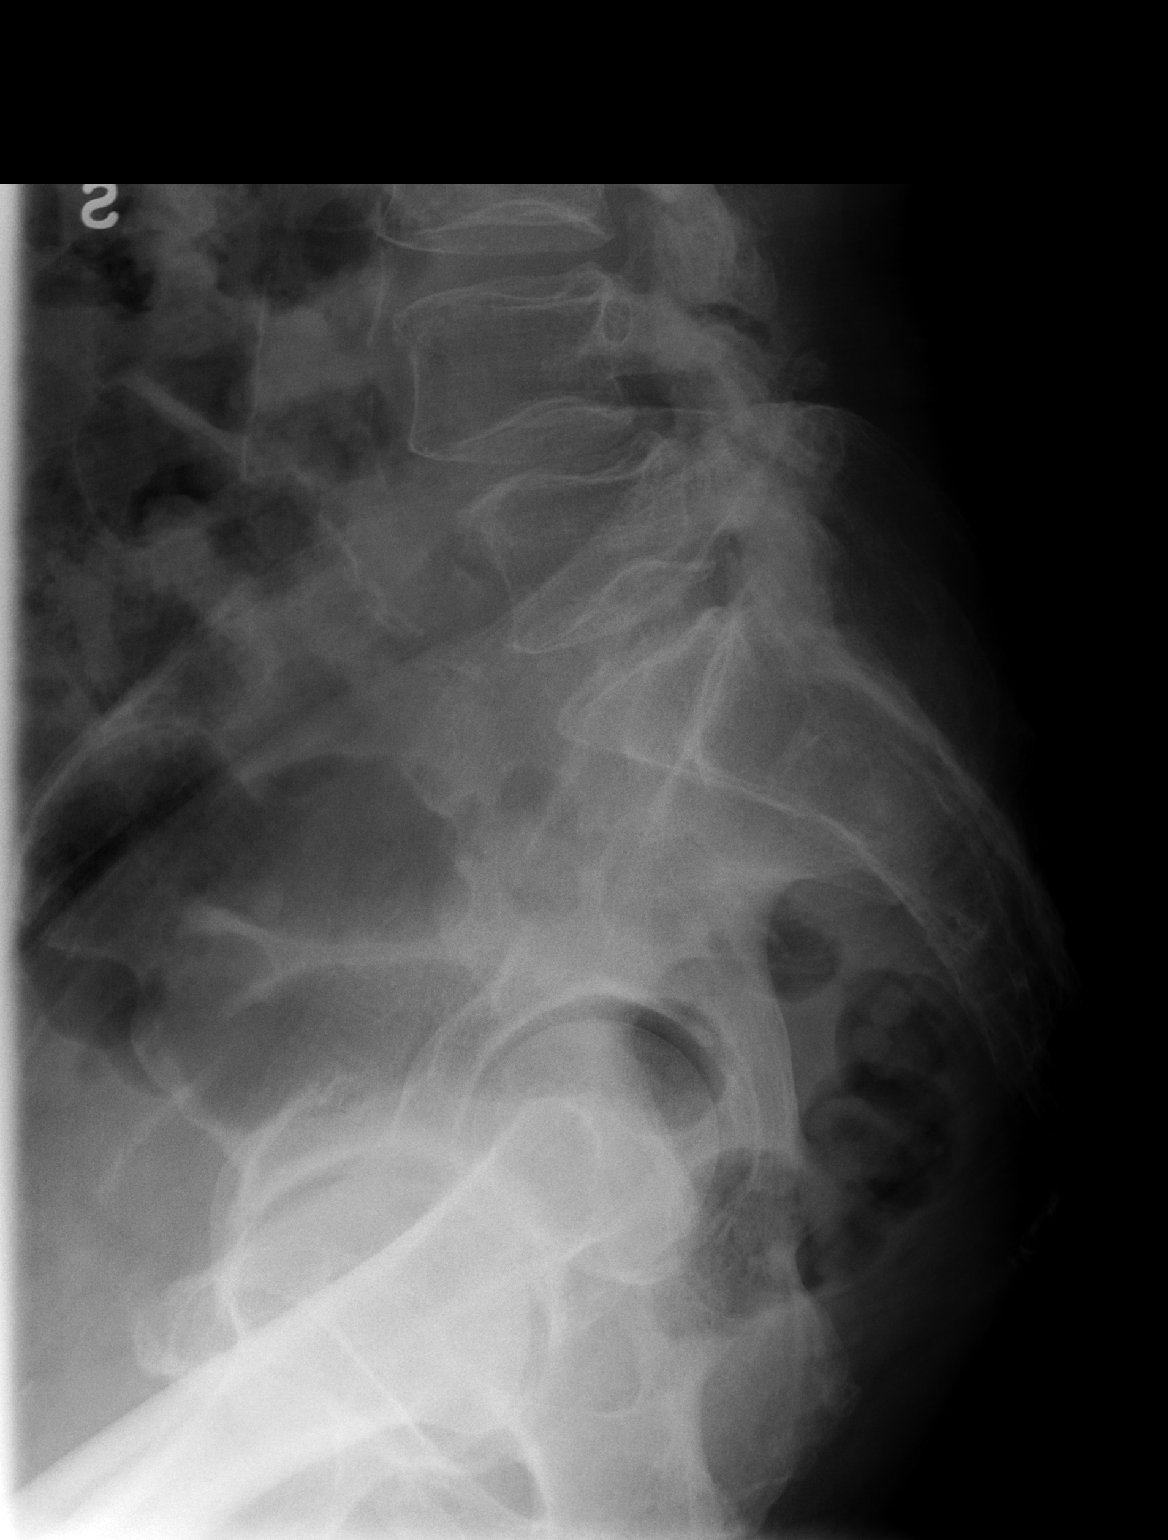

[t sacrum a.p.]
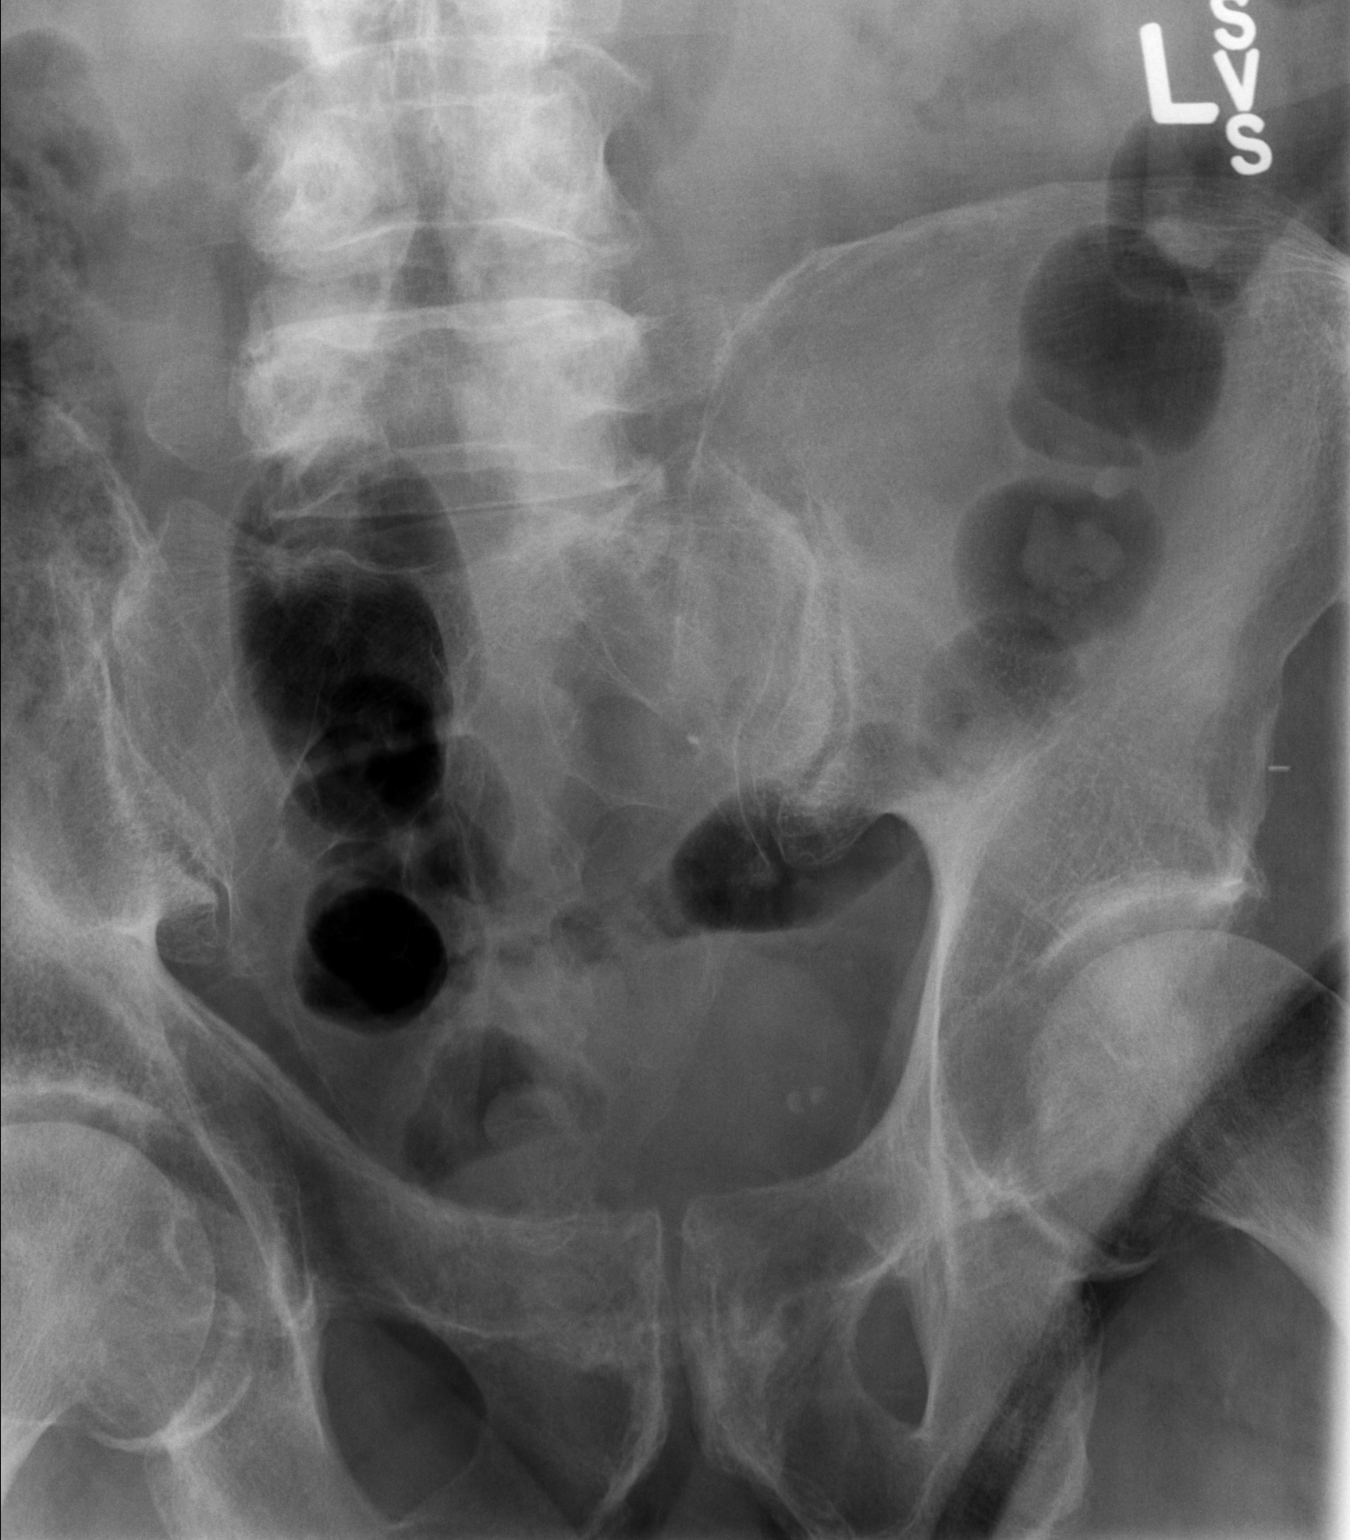

[t coccyx a.p.]
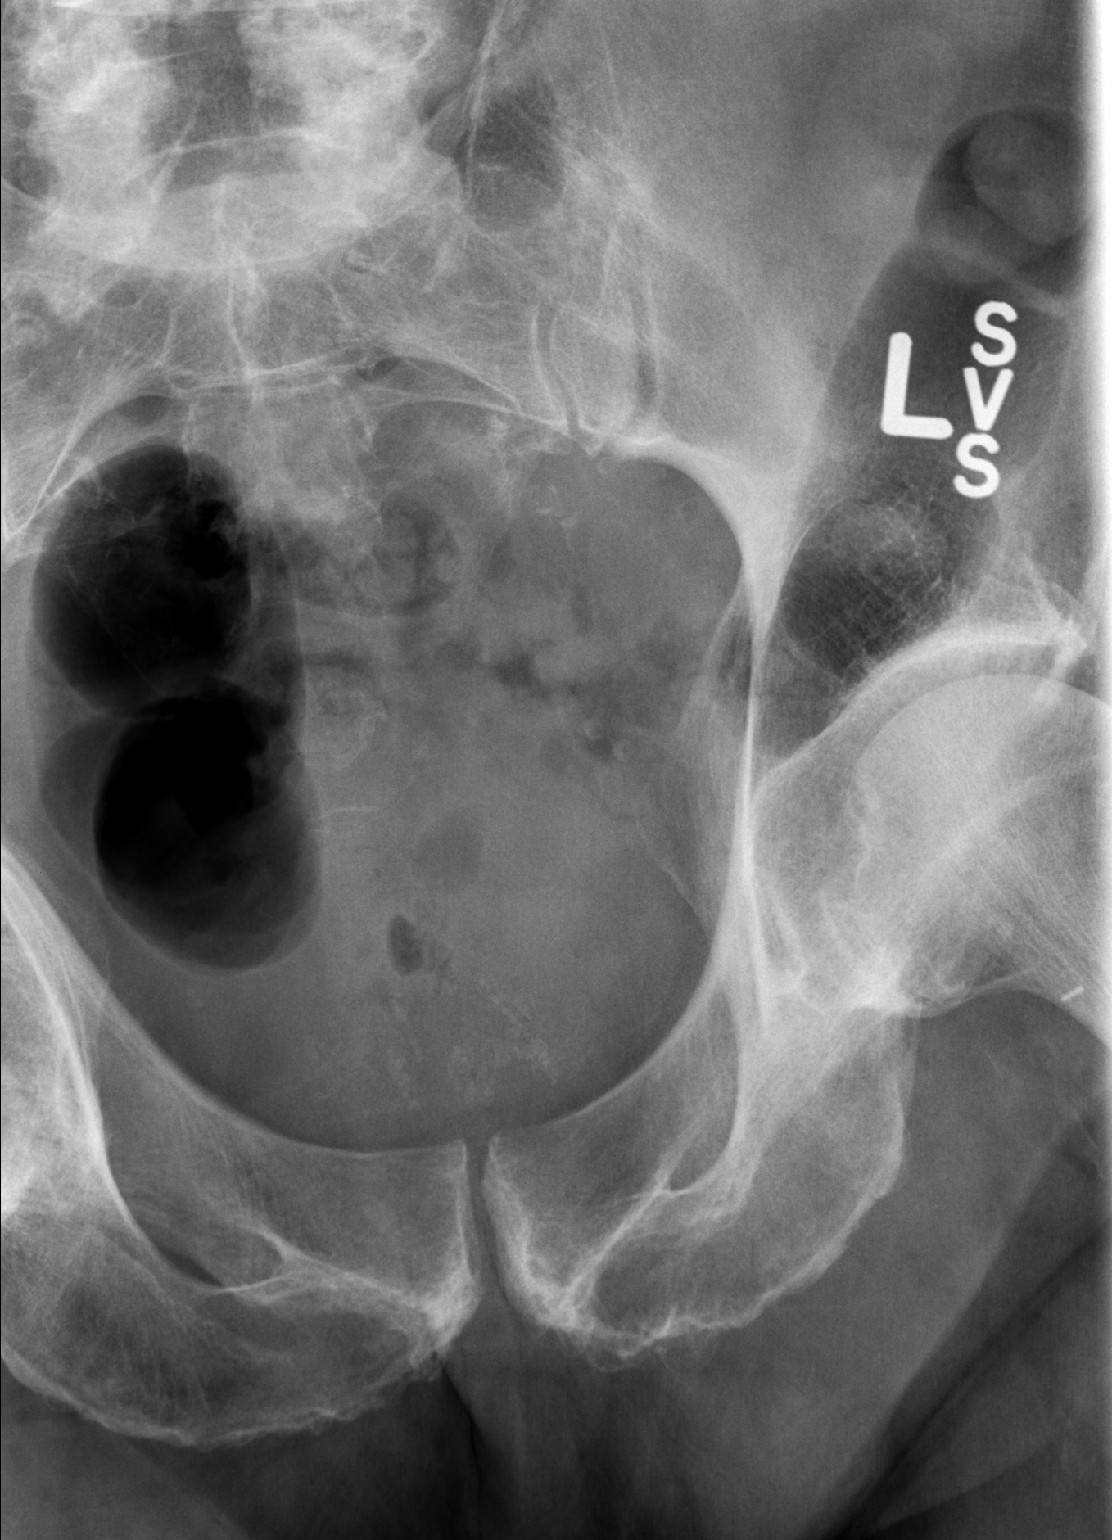

[3 of 3 positions shown; findings below may reference images not displayed]

FINDINGS: Degenerative changes in the lower lumbar spine. Mild
anterior subluxation of L4 on L5.  Visualized sacrum and coccyx
appear intact.  No destructive bone lesion nor displaced fractures
identified. Overlying bowel gas and stool obscure portions of the
sacrum.  Calcifications consistent with phleboliths.  Vascular
calcifications.
IMPRESSION: No acute fracture or displacement demonstrated in the
sacrococcygeal spine.

## 2012-04-20 ENCOUNTER — Telehealth: Payer: Self-pay | Admitting: Pulmonary Disease

## 2012-04-20 MED ORDER — BUPROPION HCL ER (SR) 150 MG PO TB12: ORAL_TABLET | ORAL | Status: DC

## 2012-04-20 MED ORDER — POTASSIUM CHLORIDE CRYS ER 20 MEQ PO TBCR: 20.0000 meq | EXTENDED_RELEASE_TABLET | Freq: Two times a day (BID) | ORAL | Status: DC

## 2012-04-20 NOTE — Telephone Encounter (Signed)
I spoke with pt and is aware of SN recs. He voiced his understanding of the directions and rx has been sent

## 2012-04-20 NOTE — Telephone Encounter (Signed)
Called and spoke with pt and his spouse about the potassium rx.  He is aware that this rx has been sent to the pharmacy for this and appt has been made for 5-24 at 12.  Pt is aware.  They were asking about the pt taking the wellbutrin to help him stop smoking.  Please advise.   Thanks  Allergies  Allergen Reactions  . Niacin     REACTION: Intol to Niacin w/ flushing in large doses

## 2012-04-20 NOTE — Telephone Encounter (Signed)
Per SN---ok to call in wellbutrin sr  Generic is ok  150mg    Start with 1 daily x 1 week then increase to 1 po bid.  #60 with 5 refills.  thanks

## 2012-05-08 ENCOUNTER — Ambulatory Visit: Payer: Medicare Other | Admitting: Pulmonary Disease

## 2012-06-09 ENCOUNTER — Other Ambulatory Visit: Payer: Self-pay | Admitting: Pulmonary Disease

## 2012-06-09 MED ORDER — SIMVASTATIN 40 MG PO TABS: 40.0000 mg | ORAL_TABLET | Freq: Every day | ORAL | Status: DC

## 2012-06-09 NOTE — Telephone Encounter (Signed)
[facility] requesting zocor 40 mg  >90 day supply rx sent to pharmacy  Pt has appt June 16 2012

## 2012-06-16 ENCOUNTER — Ambulatory Visit (INDEPENDENT_AMBULATORY_CARE_PROVIDER_SITE_OTHER): Payer: Medicare Other | Admitting: Pulmonary Disease

## 2012-06-16 ENCOUNTER — Encounter: Payer: Self-pay | Admitting: Pulmonary Disease

## 2012-06-16 ENCOUNTER — Ambulatory Visit (INDEPENDENT_AMBULATORY_CARE_PROVIDER_SITE_OTHER)
Admission: RE | Admit: 2012-06-16 | Discharge: 2012-06-16 | Disposition: A | Discharge status: Home / Self Care | Payer: Medicare Other | Source: Ambulatory Visit | Attending: Pulmonary Disease | Admitting: Pulmonary Disease

## 2012-06-16 VITALS — BP 138/78 | HR 76 | Temp 96.9°F | Ht 69.5 in | Wt 170.0 lb

## 2012-06-16 DIAGNOSIS — M545 Low back pain, unspecified: Secondary | ICD-10-CM

## 2012-06-16 DIAGNOSIS — I872 Venous insufficiency (chronic) (peripheral): Secondary | ICD-10-CM

## 2012-06-16 DIAGNOSIS — Z87898 Personal history of other specified conditions: Secondary | ICD-10-CM

## 2012-06-16 DIAGNOSIS — J449 Chronic obstructive pulmonary disease, unspecified: Secondary | ICD-10-CM

## 2012-06-16 DIAGNOSIS — R252 Cramp and spasm: Secondary | ICD-10-CM

## 2012-06-16 DIAGNOSIS — E7800 Pure hypercholesterolemia, unspecified: Secondary | ICD-10-CM

## 2012-06-16 DIAGNOSIS — N401 Enlarged prostate with lower urinary tract symptoms: Secondary | ICD-10-CM

## 2012-06-16 DIAGNOSIS — M1990 Unspecified osteoarthritis, unspecified site: Secondary | ICD-10-CM

## 2012-06-16 DIAGNOSIS — D126 Benign neoplasm of colon, unspecified: Secondary | ICD-10-CM

## 2012-06-16 DIAGNOSIS — R7309 Other abnormal glucose: Secondary | ICD-10-CM

## 2012-06-16 DIAGNOSIS — K5730 Diverticulosis of large intestine without perforation or abscess without bleeding: Secondary | ICD-10-CM

## 2012-06-16 DIAGNOSIS — I10 Essential (primary) hypertension: Secondary | ICD-10-CM

## 2012-06-16 DIAGNOSIS — J4489 Other specified chronic obstructive pulmonary disease: Secondary | ICD-10-CM

## 2012-06-16 DIAGNOSIS — N138 Other obstructive and reflux uropathy: Secondary | ICD-10-CM

## 2012-06-16 DIAGNOSIS — F17200 Nicotine dependence, unspecified, uncomplicated: Secondary | ICD-10-CM

## 2012-06-16 NOTE — Progress Notes (Addendum)
Subjective:    Patient ID: Cory [name], male    DOB: 1929-01-28, 78 y.o.   MRN: [id number]  HPI 78 y/o WM here for a follow up visit... he has multiple medical problems as noted below...   ~  Apr 18, 2010:  remarkably stable & still smokes 1ppd, playing tennis regularly in a league & no c/o dyspnea, CP, etc...  BP controlled on meds;  due for f/u FLP on his Simva40- taking 1/2 tab daily;  BPH followed by DrKimbrough &he reports PSA's have been normal;  notes some DJD/ LBP & saw DrRendall 1 mo ago w/ Dosepak that resolved the issue... no new complaints or concerns x some "muscle spasm" in feet- offered Soma [facility] but he declines new medications & will let us know.  ~  May 14, 2011:  Yearly ROV & he reports stable, doing well he says... Still smoking 1-1.5ppd w/ min cough/ phlegm/ dyspnea; plays tennis regularly w/o deterioration etc;   He notes some nocturnal leg cramps relieved by a banana & we discussed quinine vs mustard Qhs;  Due for f/u CXR & will ret for fasting blood work...  meds refilled for 90d supplies per request... CXR is OK today w/o lesions;  Labs are OK as well but K=3.4 & he's supposed to be on K20Bid already...   ~  June 16, 2012:  57mo ROV & Cory [name] has had a good yr overall> main prob was back pain "siatica" he says w/ trip to ER 7/12 & f/u DrBotero & DrRendall- we don't have any notes but he tells me that MRI done in the trailer behind Botero's office showed "siatica" & he has some weakness in right foot w/ foot drop- offered phys therapy but he declined & will pursue this thru church friend...    He brought a list of questions from wife> he stopped niacin after article in news saying it didn't help; he decided against lifeline screen this yr; he has some leg cramps on & off- using potassium & bananas which seem to help; he has itchy spots & uses witch hazel w/ relief "it helps dogs too"; using Lizard Juice E-cig to help w/ smoking cessation & he reports down 40% so far...    BP  controlled on Amlodipine; needs to ret fasting for FLP on Simva20...    We reviewed prob list, meds, xrays and labs> see below>> CXR 7/13 showed essent norm heart size, clear lungs w/ some mild bibasilar scarring, DJD in spine... LABS 7/13:  FLP- ok x HDL=33 on Simva20;  Chems- ok w/ BS=101 A1c=6.3;  CBC- ok;  TSH=1.46;  PSA=0.33         Problem List:  GLAUCOMA - on drops per DrCashwell...  Hx of OBSTRUCTIVE SLEEP APNEA (ICD-327.23) - hx mild OSA on sleep study 2001w/ RDI= 14 and desat to 85%... eval DrClance- rec weight loss & exerc, not on CPAP... he notes that he rests well & denies daytime hypersomnolence, snoring, etc...  COPD (ICD-496) & CIGARETTE SMOKER (ICD-305.1) - not currently on any meds... longtime smoker ~ 1-1.5 ppd w/ min cough/ phlegm/ DOE without change... he refuses smoking cessation help stating that he got depressed when he tried quitting in the past... baseline CXR without acute changes, and prev PFT's 2/05 showed FVC= 2.88 (75%), FEV1= 2.20 (72%), %1sec=76, mid-flows= 55% pred...  ~  CXR 5/11 showed atherosclerotic changes, sl incr markings, DJD sp, NAD. ~  CXR 5/12 showed clear lungs, NAD. ~  CXR 7/13 showed  essent norm heart size, clear lungs w/ some mild bibasilar scarring, DJD in spine... ~  7/13: he is trying [employer] & states that he is down 40% currently...  HYPERTENSION (ICD-401.9) - controlled on ASA 81mg /d, AMLODIPINE 5mg /d, & KCl Bid... ] ~  5/12:  BP= 122/70 not checking BP at home> denies HA, fatigue, visual changes, CP, palipit, dizziness, syncope, dyspnea, edema, etc; he plays tennis 2x/wk... ~  7/13:  BP= 138/78 & continues to deny CP, palpit, dizzy, SOB, edema, etc...  VENOUS INSUFFICIENCY (ICD-459.81) - he follows a low sodium diet, elevates when able and wears support hose as needed.  HYPERCHOLESTEROLEMIA (ICD-272.0) - on SIMVASTATIN 40mg /d- taking 1/2 tab daily & he added Niacin 250mg /d, but has since stopped. ~  FLP 7/08 on  Vytorin10-20 showed TChol 102, TG 94, HDL 28, LDL 55 ~  FLP 9/09 on Simvast40-1/2 showed TChol 126, TG 86, HDL 30, LDL 79... rec- same med. ~  FLP 5/11 on Simva40-1/2 showed TChol 134, TG 101, HDL 36, LDL 77 ~  FLP 5/12 on Simva40- 1/2 showed TChol 106, TG 92, HDL 40, LDL 48 ~  FLP 7/13 on Simva20 showed TChol 122, TG 96, HDL 33, LDL 70  DIABETES MELLITUS, BORDERLINE (ICD-790.29) - on diet alone... ~  labs 7/08 showed BS= 125, HgA1c= 6.5 ~  labs 9/09 showed BS= 133, HgA1c= 6.2 ~  labs 5/11 showed BS= 111, A1c= 6.4 ~  Labs 5/12 showed BS= 108 ~  Labs 7/13 showed BS= 101, A1c= 6.3  DIVERTICULOSIS OF COLON (ICD-562.10) & COLONIC POLYPS (ICD-211.3) -  ~  colonoscopy 9/04 showed divertics & several 4-67mm polyps= hyperpl and tubular adenomas... ~  f/u colonoscopy 10/09 by DrStark showed divertics, 3mm polyp= tubular adenoma, +hems...  BENIGN PROSTATIC HYPERTROPHY, HX OF (ICD-V13.8) - followed by DrKimbrough et al... last note from Urology= 11/09 w/ Dx= Renal cyst & Obsrtuctive Uropathy (mild BPH) & +FamHx prostate Ca in father... hx mild hematuria w/ cysto in 2007 showing a sm diverticulum off the left base of the bladder... ~  Labs 5/11 showed PSA= 0.41 ~  He tells me that DrKimbrough stopped doing PSAs & he's OK w/ this... ~  Labs 7/13 showed PSA= 0.33  DEGENERATIVE JOINT DISEASE (ICD-715.90) - uses OTC NSAIDs as needed... LBP & "Siatica" per DrBotero >> he had lumbar lam 1997 by DrRobinson ~  He notes LBP and went to ER 7/12> XRays showed postop & degen changes in lumbar spine w/ mild ant sublux L4 on L5; XRay Sacrum showed no displacement or fx; XRay of R Hip showed degen changes w/o fx etc;  He went to [facility] w/ MRI done (we don't have any records) & pt states it showed "siatica", given shots by [facility] w/ some benefit...  BACK PAIN, LUMBAR (ICD-724.2) - hx of HNP/ sp stenosis... s/p lumbar lam 1997 by DrRobinson... ~  5/11: he reports LBP ~67mo ago Rx by DrRendall w/ Dosepak & symptoms  resolved.  Hx of LEG CRAMPS (ICD-729.82) - he uses KCl w/ relief (2 tabs daily) + Bananas... hx of rash from Quinine...  VITAMIN D DEFICIENCY - on Vit D 2000 u daily... ~  Labs 5/11 showed Vit D level = 18... rec to start 2000u OTC supplement daily. ~  Labs 5/12 showed Vit D level = 39... Continue daily supplement   SKIN CANCER, HX OF (ICD-V10.83) - s/p basal cell on scalp, Dr Donzetta Starch...  Hx of SHINGLES - right T4 distrib shingles in 2000.Marland KitchenMarland Kitchen  Past Surgical History  Procedure Date  . Appendectomy   . Tonsillectomy and adenoidectomy   . Left inguinal hernia repair 1998    Dr. Earlene Plater  . Lumbar laminectomy 1997    Dr. Roxan Hockey    Outpatient Encounter Prescriptions as of 06/16/2012  Medication Sig Dispense Refill  . amLODipine (NORVASC) 5 MG tablet Take 1 tablet (5 mg total) by mouth daily.  90 tablet  3  . aspirin 81 MG tablet Take 81 mg by mouth daily.        . Cholecalciferol (VITAMIN D) 2000 UNITS CAPS Take 1 capsule by mouth daily.        Marland Kitchen latanoprost (XALATAN) 0.005 % ophthalmic solution Place 1 drop into the left eye at bedtime.        . Multiple Vitamin (MULTIVITAMIN) capsule Take 1 capsule by mouth daily.        . potassium chloride SA (KLOR-CON M20) 20 MEQ tablet Take 1 tablet (20 mEq total) by mouth 2 (two) times daily.  180 tablet  0  . simvastatin (ZOCOR) 40 MG tablet Take 1/2 tablet by mouth daily      . vitamin C (ASCORBIC ACID) 500 MG tablet Take 500 mg by mouth daily.        Marland Kitchen DISCONTD: simvastatin (ZOCOR) 40 MG tablet Take 1 tablet (40 mg total) by mouth at bedtime.  90 tablet  3  . DISCONTD: buPROPion (WELLBUTRIN SR) 150 MG 12 hr tablet Take 1 tablet once a day x 1 week then increase to 1 tablet twice a day  60 tablet  5  . DISCONTD: niacin 250 MG tablet Take 250 mg by mouth daily with breakfast.          Allergies  Allergen Reactions  . Niacin     REACTION: Intol to Niacin w/ flushing in large doses    Current Medications, Allergies, Past Medical  History, Past Surgical History, Family History, and Social History were reviewed in [employer] record.   Review of Systems       See HPI - all other systems neg except as noted... The patient notes intermittent back pain;  He denies anorexia, fever, weight loss, weight gain, vision loss, decreased hearing, hoarseness, chest pain, syncope, dyspnea on exertion, peripheral edema, prolonged cough, headaches, hemoptysis, abdominal pain, melena, hematochezia, severe indigestion/heartburn, hematuria, incontinence, muscle weakness, suspicious skin lesions, transient blindness, difficulty walking, depression, unusual weight change, abnormal bleeding, enlarged lymph nodes, and angioedema.     Objective:   Physical Exam     WD, WN, 78 y/o WM in NAD...  GENERAL:  Alert & oriented; pleasant & cooperative... HEENT:  Wells/AT, EOM-wnl, PERRLA, EACs-clear, TMs-wnl, NOSE-clear, THROAT-clear & wnl. NECK:  Supple w/ fairROM; no JVD; normal carotid impulses w/o bruits; no thyromegaly or nodules palpated; no lymphadenopathy. CHEST:  Clear to P & A; without wheezes/ rales/ or rhonchi heard... HEART:  Regular Rhythm; without murmurs/ rubs/ or gallops detected... ABDOMEN:  Soft & nontender; normal bowel sounds; no organomegaly or masses palpated... He has a "touchey" abd and is difficult to examine... RECTAL:  Neg - prostate 3+ & nontender w/o nodules; stool hematest neg. EXT: without deformities, mild arthritic changes; no varicose veins/ venous insuffic/ or edema. NEURO:  CN's intact; motor testing normal; sensory testing normal; gait normal & balance OK. DERM:  No lesions noted; no rash etc...  RADIOLOGY DATA:  Reviewed in the EPIC EMR & discussed w/ the patient...  LABORATORY DATA:  Reviewed in the EPIC  EMR & discussed w/ the patient...   Assessment & Plan:   COPD/ Cig Smoker> he is asymptomatic but still smoking now <1ppd using e-cig, not interestred in other smoking cessation help  etc...  HBP>  Controlled on meds, continue same + low sodium etc...  CHOL>  Looks great on diet + Simva20...  DM>  Remains diet controlled...  GI>  Followed by DrStark & up to date...  DJD> still manages to play tennis etc..  Leg Cramps>  He uses the KCl + bananas etc...  Other medical issues as noted...   Patient's Medications  New Prescriptions   No medications on file  Previous Medications   AMLODIPINE (NORVASC) 5 MG TABLET    Take 1 tablet (5 mg total) by mouth daily.   ASPIRIN 81 MG TABLET    Take 81 mg by mouth daily.     CHOLECALCIFEROL (VITAMIN D) 2000 UNITS CAPS    Take 1 capsule by mouth daily.     LATANOPROST (XALATAN) 0.005 % OPHTHALMIC SOLUTION    Place 1 drop into the left eye at bedtime.     MULTIPLE VITAMIN (MULTIVITAMIN) CAPSULE    Take 1 capsule by mouth daily.     POTASSIUM CHLORIDE SA (KLOR-CON M20) 20 MEQ TABLET    Take 1 tablet (20 mEq total) by mouth 2 (two) times daily.   VITAMIN C (ASCORBIC ACID) 500 MG TABLET    Take 500 mg by mouth daily.    Modified Medications   Modified Medication Previous Medication   SIMVASTATIN (ZOCOR) 40 MG TABLET simvastatin (ZOCOR) 40 MG tablet      Take 1/2 tablet by mouth daily    Take 1 tablet (40 mg total) by mouth at bedtime.  Discontinued Medications   BUPROPION (WELLBUTRIN SR) 150 MG 12 HR TABLET    Take 1 tablet once a day x 1 week then increase to 1 tablet twice a day   NIACIN 250 MG TABLET    Take 250 mg by mouth daily with breakfast.

## 2012-06-16 NOTE — Patient Instructions (Addendum)
Today we updated your med list in our EPIC system...    Continue your current medications the same...  Today we did your follow up CXR...    Please return to our lab one morning soon for your FASTING blood work...    We will call you w/ the results when available...  Keep up the good work w/ diet & exercise!!!    Good luck w/ the [employer]...  Call for any questions or if we can be of service in any way.Marland KitchenMarland Kitchen

## 2012-06-22 ENCOUNTER — Other Ambulatory Visit (INDEPENDENT_AMBULATORY_CARE_PROVIDER_SITE_OTHER): Payer: Medicare Other

## 2012-06-22 DIAGNOSIS — E7800 Pure hypercholesterolemia, unspecified: Secondary | ICD-10-CM

## 2012-06-22 DIAGNOSIS — R7309 Other abnormal glucose: Secondary | ICD-10-CM

## 2012-06-22 DIAGNOSIS — N401 Enlarged prostate with lower urinary tract symptoms: Secondary | ICD-10-CM

## 2012-06-22 DIAGNOSIS — N138 Other obstructive and reflux uropathy: Secondary | ICD-10-CM

## 2012-06-22 DIAGNOSIS — I10 Essential (primary) hypertension: Secondary | ICD-10-CM

## 2012-06-22 DIAGNOSIS — K5730 Diverticulosis of large intestine without perforation or abscess without bleeding: Secondary | ICD-10-CM

## 2012-06-22 DIAGNOSIS — N139 Obstructive and reflux uropathy, unspecified: Secondary | ICD-10-CM

## 2012-06-22 LAB — CBC WITH DIFFERENTIAL/PLATELET
Basophils Relative: 0.8 % (ref 0.0–3.0)
Eosinophils Absolute: 0.4 10*3/uL (ref 0.0–0.7)
Eosinophils Relative: 3.1 % (ref 0.0–5.0)
Hemoglobin: 14.7 g/dL (ref 13.0–17.0)
Lymphocytes Relative: 31.4 % (ref 12.0–46.0)
MCHC: 33.7 g/dL (ref 30.0–36.0)
MCV: 93.8 fl (ref 78.0–100.0)
Neutro Abs: 6.5 10*3/uL (ref 1.4–7.7)
Neutrophils Relative %: 56.3 % (ref 43.0–77.0)
RBC: 4.65 Mil/uL (ref 4.22–5.81)
WBC: 11.5 10*3/uL — ABNORMAL HIGH (ref 4.5–10.5)

## 2012-06-22 LAB — LIPID PANEL
Cholesterol: 122 mg/dL (ref 0–200)
HDL: 33.2 mg/dL — ABNORMAL LOW (ref >39.00)
LDL Cholesterol: 70 mg/dL (ref 0–99)
Total CHOL/HDL Ratio: 4
Triglycerides: 96 mg/dL (ref 0.0–149.0)

## 2012-06-22 LAB — HEPATIC FUNCTION PANEL
ALT: 21 U/L (ref 0–53)
Bilirubin, Direct: 0.3 mg/dL (ref 0.0–0.3)
Total Bilirubin: 2 mg/dL — ABNORMAL HIGH (ref 0.3–1.2)
Total Protein: 6.7 g/dL (ref 6.0–8.3)

## 2012-06-22 LAB — HEMOGLOBIN A1C: Hgb A1c MFr Bld: 6.3 % (ref 4.6–6.5)

## 2012-06-22 LAB — BASIC METABOLIC PANEL
BUN: 16 mg/dL (ref 6–23)
Calcium: 8.7 mg/dL (ref 8.4–10.5)
Creatinine, Ser: 1 mg/dL (ref 0.4–1.5)

## 2012-06-24 LAB — PSA: PSA: 0.33 ng/mL (ref 0.10–4.00)

## 2012-08-06 ENCOUNTER — Telehealth: Payer: Self-pay | Admitting: Pulmonary Disease

## 2012-08-06 MED ORDER — POTASSIUM CHLORIDE CRYS ER 20 MEQ PO TBCR: 20.0000 meq | EXTENDED_RELEASE_TABLET | Freq: Two times a day (BID) | ORAL | Status: DC

## 2012-08-06 NOTE — Telephone Encounter (Signed)
Pt notified that refill was sent to pharmacy 

## 2012-09-11 ENCOUNTER — Ambulatory Visit (INDEPENDENT_AMBULATORY_CARE_PROVIDER_SITE_OTHER): Payer: Medicare Other

## 2012-09-11 DIAGNOSIS — Z23 Encounter for immunization: Secondary | ICD-10-CM

## 2012-09-16 ENCOUNTER — Encounter: Payer: Self-pay | Admitting: Gastroenterology

## 2013-01-14 ENCOUNTER — Other Ambulatory Visit: Payer: Self-pay | Admitting: Pulmonary Disease

## 2013-04-09 ENCOUNTER — Other Ambulatory Visit: Payer: Self-pay | Admitting: Pulmonary Disease

## 2013-04-27 ENCOUNTER — Other Ambulatory Visit (INDEPENDENT_AMBULATORY_CARE_PROVIDER_SITE_OTHER): Payer: Medicare Other

## 2013-04-27 ENCOUNTER — Encounter: Payer: Self-pay | Admitting: Pulmonary Disease

## 2013-04-27 ENCOUNTER — Telehealth: Payer: Self-pay | Admitting: Pulmonary Disease

## 2013-04-27 ENCOUNTER — Ambulatory Visit (INDEPENDENT_AMBULATORY_CARE_PROVIDER_SITE_OTHER): Payer: Medicare Other | Admitting: Pulmonary Disease

## 2013-04-27 VITALS — BP 124/68 | HR 76 | Temp 98.1°F | Ht 69.5 in | Wt 160.8 lb

## 2013-04-27 DIAGNOSIS — I872 Venous insufficiency (chronic) (peripheral): Secondary | ICD-10-CM

## 2013-04-27 DIAGNOSIS — J449 Chronic obstructive pulmonary disease, unspecified: Secondary | ICD-10-CM

## 2013-04-27 DIAGNOSIS — K089 Disorder of teeth and supporting structures, unspecified: Secondary | ICD-10-CM

## 2013-04-27 DIAGNOSIS — R221 Localized swelling, mass and lump, neck: Secondary | ICD-10-CM

## 2013-04-27 DIAGNOSIS — R220 Localized swelling, mass and lump, head: Secondary | ICD-10-CM | POA: Insufficient documentation

## 2013-04-27 DIAGNOSIS — R7309 Other abnormal glucose: Secondary | ICD-10-CM

## 2013-04-27 DIAGNOSIS — I10 Essential (primary) hypertension: Secondary | ICD-10-CM

## 2013-04-27 DIAGNOSIS — F17200 Nicotine dependence, unspecified, uncomplicated: Secondary | ICD-10-CM

## 2013-04-27 LAB — CBC WITH DIFFERENTIAL/PLATELET
Basophils Relative: 0.9 % (ref 0.0–3.0)
Eosinophils Absolute: 0.2 10*3/uL (ref 0.0–0.7)
Eosinophils Relative: 2.3 % (ref 0.0–5.0)
HCT: 42.1 % (ref 39.0–52.0)
Hemoglobin: 14.5 g/dL (ref 13.0–17.0)
Lymphs Abs: 3.3 10*3/uL (ref 0.7–4.0)
MCHC: 34.4 g/dL (ref 30.0–36.0)
MCV: 90.3 fl (ref 78.0–100.0)
Monocytes Absolute: 1 10*3/uL (ref 0.1–1.0)
Neutro Abs: 5.4 10*3/uL (ref 1.4–7.7)
RBC: 4.66 Mil/uL (ref 4.22–5.81)
WBC: 10 10*3/uL (ref 4.5–10.5)

## 2013-04-27 LAB — SEDIMENTATION RATE: Sed Rate: 15 mm/hr (ref 0–22)

## 2013-04-27 LAB — BASIC METABOLIC PANEL
CO2: 32 mEq/L (ref 19–32)
Chloride: 102 mEq/L (ref 96–112)
Potassium: 3.5 mEq/L (ref 3.5–5.1)

## 2013-04-27 NOTE — Telephone Encounter (Signed)
Per SN---  Ok to add pt on this afternoon with his wife.  thanks

## 2013-04-27 NOTE — Patient Instructions (Addendum)
Today we updated your med list in our EPIC system...    Continue your current medications the same...  Today we did some follow up blood work... We will arrange for a CT Scan of your neck to evaluate the knot & search for any other hidden process...    We will contact you w/ the results when available...   In the meanwhile- continue the planned dental treatment, and use some lozenges to increase salivary function...  The next step depends on these results>>    If the knot is resoving- [place]!    If not resolving we may need a biopsy.Marland KitchenMarland Kitchen

## 2013-04-27 NOTE — Telephone Encounter (Signed)
Lump on neck under ear--  Lump appeared x 4-5 days ago States it "kinda" hurts upon touch, no discoloration, about the size of a 50 cent piece and "hard inside" upon touch Went to dentist yesterday and was told to "get to your Doctor" Patients wife is being seen today @ 3pm, patient woulld like to know if he could also be seen  Dr. Kriste Basque please advise, thank you   Allergies  Allergen Reactions  . Niacin     REACTION: Intol to Niacin w/ flushing in large doses    Last OV: 06/16/12 1 yr follow up not scheduled at this time

## 2013-04-27 NOTE — Telephone Encounter (Signed)
Patient has been added to Dr. Kriste Basque schedule for today at 3pm Patient aware and nothing further needed at this time.

## 2013-04-30 ENCOUNTER — Encounter: Payer: Self-pay | Admitting: Pulmonary Disease

## 2013-04-30 ENCOUNTER — Ambulatory Visit (INDEPENDENT_AMBULATORY_CARE_PROVIDER_SITE_OTHER)
Admission: RE | Admit: 2013-04-30 | Discharge: 2013-04-30 | Disposition: A | Discharge status: Home / Self Care | Payer: Medicare Other | Source: Ambulatory Visit | Attending: Pulmonary Disease | Admitting: Pulmonary Disease

## 2013-04-30 ENCOUNTER — Other Ambulatory Visit: Payer: Self-pay | Admitting: Pulmonary Disease

## 2013-04-30 DIAGNOSIS — F17200 Nicotine dependence, unspecified, uncomplicated: Secondary | ICD-10-CM

## 2013-04-30 DIAGNOSIS — R220 Localized swelling, mass and lump, head: Secondary | ICD-10-CM

## 2013-04-30 MED ORDER — IOHEXOL 300 MG/ML  SOLN
75.0000 mL | Freq: Once | INTRAMUSCULAR | Status: AC | PRN
  Administered 2013-04-30: 75 mL via INTRAVENOUS

## 2013-04-30 NOTE — Progress Notes (Addendum)
Subjective:    Patient ID: Cory [name], male    DOB: 1929-07-31, 79 y.o.   MRN: [id number]  HPI 79 y/o WM here for a follow up visit... he has multiple medical problems as noted below...   ~  Apr 18, 2010:  remarkably stable & still smokes 1ppd, playing tennis regularly in a league & no c/o dyspnea, CP, etc...  BP controlled on meds;  due for f/u FLP on his Simva40- taking 1/2 tab daily;  BPH followed by DrKimbrough &he reports PSA's have been normal;  notes some DJD/ LBP & saw DrRendall 1 mo ago w/ Dosepak that resolved the issue... no new complaints or concerns x some "muscle spasm" in feet- offered Soma Rx but he declines new medications & will let us know.  ~  May 14, 2011:  Yearly ROV & he reports stable, doing well he says... Still smoking 1-1.5ppd w/ min cough/ phlegm/ dyspnea; plays tennis regularly w/o deterioration etc;   He notes some nocturnal leg cramps relieved by a banana & we discussed quinine vs mustard Qhs;  Due for f/u CXR & will ret for fasting blood work...  meds refilled for 90d supplies per request... CXR is OK today w/o lesions;  Labs are OK as well but K=3.4 & he's supposed to be on K20Bid already...   ~  June 16, 2012:  65mo ROV & Cory [name] has had a good yr overall> main prob was back pain "siatica" he says w/ trip to ER 7/12 & f/u DrBotero & DrRendall- we don't have any notes but he tells me that MRI done in the trailer behind Botero's office showed "siatica" & he has some weakness in right foot w/ foot drop- offered phys therapy but he declined & will pursue this thru church friend...    He brought a list of questions from wife> he stopped niacin after article in news saying it didn't help; he decided against lifeline screen this yr; he has some leg cramps on & off- using potassium & bananas which seem to help; he has itchy spots & uses witch hazel w/ relief "it helps dogs too"; using Lizard Juice E-cig to help w/ smoking cessation & he reports down 40% so far...    BP  controlled on Amlodipine; needs to ret fasting for FLP on Simva20...    We reviewed prob list, meds, xrays and labs> see below>> CXR 7/13 showed essent norm heart size, clear lungs w/ some mild bibasilar scarring, DJD in spine... LABS 7/13:  FLP- ok x HDL=33 on Simva20;  Chems- ok w/ BS=101 A1c=6.3;  CBC- ok;  TSH=1.46;  PSA=0.33   ~  Apr 27, 2013:  74mo ROV & add-on appt requested for knot in the neck> he reports recent dental problems, infected tooth right maxilla where implant done in past, this has been manipulated by the dentist, on antibiotics, & plans surg next week (to remove the titanium post); notes a swelling/ knot in right mid neck x1wk- firm ?LN mid neck, denies f/c/s, denies hoarseness/ dysphagia, denies CP/SOB/ cough phlegm hemoptysis... We discussed need for screening labs, CT soft tissues of the neck w/ contrast & then further eval once this is known... LABS 5/14 showed BMet- normal w/ Cr=1.0;  CBC- wnl w/ Hg=14.5, WBC=10K;  Sed=15... CT Soft Tissues of Neck 5/15 showed right neck adenopathy & right paratracheal adenopathy displacing the SVC; incidental note made of a right thyroid lesion, atheromatous changes in great vessels, cervical spondylosis... WE WILL PROCEED w/ CXR &  CT CHEST w/ contrast => then decide on approach for Bx prior to Oncology & XRT referral... ADDENDUM >> CXR - wasn't done as pt forgot to go to [facility] for the film!!! This needs to be completed when able... CT Chest w/ Contast 05/03/13 showed extensive mediastinal, right hilar, right cervical & right supraclav adenopathy; "patchy nodularity" in RUL w/o mass seen; note is made of atherosclerotic dis in vessels, right renal cyst (intermed attenuation)... BIOPSY by IR 05/06/13 of right supraclavicular node => POS for SMALL CELL CARCINOMA and we will arrange for cancer center referral, DrMohammed.         Problem List:  GLAUCOMA - on drops per DrCashwell...  Hx of OBSTRUCTIVE SLEEP APNEA (ICD-327.23) - hx mild OSA  on sleep study 2001w/ RDI= 14 and desat to 85%... eval DrClance- rec weight loss & exerc, not on CPAP... he notes that he rests well & denies daytime hypersomnolence, snoring, etc...  COPD (ICD-496) & CIGARETTE SMOKER (ICD-305.1) - not currently on any meds... longtime smoker ~ 1-1.5 ppd w/ min cough/ phlegm/ DOE without change... he refuses smoking cessation help stating that he got depressed when he tried quitting in the past... baseline CXR without acute changes, and prev PFT's 2/05 showed FVC= 2.88 (75%), FEV1= 2.20 (72%), %1sec=76, mid-flows= 55% pred...  ~  CXR 5/11 showed atherosclerotic changes, sl incr markings, DJD sp, NAD. ~  CXR 5/12 showed clear lungs, NAD. ~  CXR 7/13 showed essent norm heart size, clear lungs w/ some mild bibasilar scarring, DJD in spine... ~  7/13: he is trying [employer] & states that he is down 40% currently...  NEW 5/14 presentation w/ right neck adenopathy => work up in progress...  HYPERTENSION (ICD-401.9) - controlled on ASA 81mg /d, AMLODIPINE 5mg /d, & KCl Bid... ] ~  5/12:  BP= 122/70 not checking BP at home> denies HA, fatigue, visual changes, CP, palipit, dizziness, syncope, dyspnea, edema, etc; he plays tennis 2x/wk... ~  7/13:  BP= 138/78 & continues to deny CP, palpit, dizzy, SOB, edema, etc...  VENOUS INSUFFICIENCY (ICD-459.81) - he follows a low sodium diet, elevates when able and wears support hose as needed.  HYPERCHOLESTEROLEMIA (ICD-272.0) - on SIMVASTATIN 40mg /d- taking 1/2 tab daily & he added Niacin 250mg /d, but has since stopped. ~  FLP 7/08 on Vytorin10-20 showed TChol 102, TG 94, HDL 28, LDL 55 ~  FLP 9/09 on Simvast40-1/2 showed TChol 126, TG 86, HDL 30, LDL 79... rec- same med. ~  FLP 5/11 on Simva40-1/2 showed TChol 134, TG 101, HDL 36, LDL 77 ~  FLP 5/12 on Simva40- 1/2 showed TChol 106, TG 92, HDL 40, LDL 48 ~  FLP 7/13 on Simva20 showed TChol 122, TG 96, HDL 33, LDL 70  DIABETES MELLITUS, BORDERLINE (ICD-790.29) - on  diet alone... ~  labs 7/08 showed BS= 125, HgA1c= 6.5 ~  labs 9/09 showed BS= 133, HgA1c= 6.2 ~  labs 5/11 showed BS= 111, A1c= 6.4 ~  Labs 5/12 showed BS= 108 ~  Labs 7/13 showed BS= 101, A1c= 6.3  DIVERTICULOSIS OF COLON (ICD-562.10) & COLONIC POLYPS (ICD-211.3) -  ~  colonoscopy 9/04 showed divertics & several 4-56mm polyps= hyperpl and tubular adenomas... ~  f/u colonoscopy 10/09 by DrStark showed divertics, 3mm polyp= tubular adenoma, +hems...  BENIGN PROSTATIC HYPERTROPHY, HX OF (ICD-V13.8) - followed by DrKimbrough et al... last note from Urology= 11/09 w/ Dx= Renal cyst & Obsrtuctive Uropathy (mild BPH) & +FamHx prostate Ca in father... hx mild hematuria w/  cysto in 2007 showing a sm diverticulum off the left base of the bladder... ~  Labs 5/11 showed PSA= 0.41 ~  He tells me that DrKimbrough stopped doing PSAs & he's OK w/ this... ~  Labs 7/13 showed PSA= 0.33  DEGENERATIVE JOINT DISEASE (ICD-715.90) - uses OTC NSAIDs as needed... LBP & "Siatica" per DrBotero >> he had lumbar lam 1997 by DrRobinson ~  He notes LBP and went to ER 7/12> XRays showed postop & degen changes in lumbar spine w/ mild ant sublux L4 on L5; XRay Sacrum showed no displacement or fx; XRay of R Hip showed degen changes w/o fx etc;  He went to [facility] w/ MRI done (we don't have any records) & pt states it showed "siatica", given shots by [facility] w/ some benefit...  BACK PAIN, LUMBAR (ICD-724.2) - hx of HNP/ sp stenosis... s/p lumbar lam 1997 by DrRobinson... ~  5/11: he reports LBP ~19mo ago Rx by DrRendall w/ Dosepak & symptoms resolved.  Hx of LEG CRAMPS (ICD-729.82) - he uses KCl w/ relief (2 tabs daily) + Bananas... hx of rash from Quinine...  VITAMIN D DEFICIENCY - on Vit D 2000 u daily... ~  Labs 5/11 showed Vit D level = 18... rec to start 2000u OTC supplement daily. ~  Labs 5/12 showed Vit D level = 39... Continue daily supplement   SKIN CANCER, HX OF (ICD-V10.83) - s/p basal cell on scalp, Dr  Donzetta Starch...  Hx of SHINGLES - right T4 distrib shingles in 2000...   Past Surgical History  Procedure Laterality Date  . Appendectomy    . Tonsillectomy and adenoidectomy    . Left inguinal hernia repair  1998    Dr. Earlene Plater  . Lumbar laminectomy  1997    Dr. Roxan Hockey    Outpatient Encounter Prescriptions as of 04/27/2013  Medication Sig Dispense Refill  . amLODipine (NORVASC) 5 MG tablet TAKE ONE TABLET BY MOUTH ONE TIME DAILY  90 tablet  0  . aspirin 81 MG tablet Take 81 mg by mouth daily.        . Cholecalciferol (VITAMIN D) 2000 UNITS CAPS Take 1 capsule by mouth daily.        Marland Kitchen latanoprost (XALATAN) 0.005 % ophthalmic solution Place 1 drop into the left eye at bedtime.        . Multiple Vitamin (MULTIVITAMIN) capsule Take 1 capsule by mouth daily.        . potassium chloride SA (KLOR-CON M20) 20 MEQ tablet Take 1 tablet (20 mEq total) by mouth 2 (two) times daily.  180 tablet  3  . simvastatin (ZOCOR) 40 MG tablet Take 1/2 tablet by mouth daily      . vitamin C (ASCORBIC ACID) 500 MG tablet Take 500 mg by mouth daily.         No facility-administered encounter medications on file as of 04/27/2013.    Allergies  Allergen Reactions  . Niacin     REACTION: Intol to Niacin w/ flushing in large doses    Current Medications, Allergies, Past Medical History, Past Surgical History, Family History, and Social History were reviewed in [employer] record.   Review of Systems       See HPI - all other systems neg except as noted... The patient notes intermittent back pain;  He denies anorexia, fever, weight loss, weight gain, vision loss, decreased hearing, hoarseness, chest pain, syncope, dyspnea on exertion, peripheral edema, prolonged cough, headaches, hemoptysis, abdominal pain, melena, hematochezia,  severe indigestion/heartburn, hematuria, incontinence, muscle weakness, suspicious skin lesions, transient blindness, difficulty walking, depression, unusual  weight change, abnormal bleeding, enlarged lymph nodes, and angioedema.     Objective:   Physical Exam     WD, WN, 79 y/o WM in NAD...  GENERAL:  Alert & oriented; pleasant & cooperative... HEENT:  [place]/AT, EOM-wnl, PERRLA, EACs-clear, TMs-wnl, NOSE-clear, THROAT- sl red w/o exud, +dental issues w/ titanium post exposure right maxilla... NECK:  Supple w/ fairROM; no JVD; normal carotid impulses w/o bruits; no thyromegaly, +right mid neck adenopathy- non tender CHEST:  Clear to P & A; without wheezes/ rales/ or rhonchi heard... HEART:  Regular Rhythm; without murmurs/ rubs/ or gallops detected... ABDOMEN:  Soft & nontender; normal bowel sounds; no organomegaly or masses palpated... He has a "touchey" abd and is difficult to examine... (RECTAL:  Neg - prostate 3+ & nontender w/o nodules; stool hematest neg) EXT: without deformities, mild arthritic changes; no varicose veins/ venous insuffic/ or edema. NEURO:  CN's intact; motor testing normal; sensory testing normal; gait normal & balance OK. DERM:  No lesions noted; no rash etc...  RADIOLOGY DATA:  Reviewed in the EPIC EMR & discussed w/ the patient...  LABORATORY DATA:  Reviewed in the EPIC EMR & discussed w/ the patient...   Assessment & Plan:    Right neck adenopathy ?etiology but of concern given his long smoking hx; proceed w/ CT soft tissues of neck...   COPD/ Cig Smoker> he is asymptomatic but still smoking now <1ppd using e-cig, not interestred in other smoking cessation help etc...  HBP>  Controlled on meds, continue same + low sodium etc...  CHOL>  Looks great on diet + Simva20...  DM>  Remains diet controlled...  GI>  Followed by DrStark & up to date...  DJD> still manages to play tennis etc..  Leg Cramps>  He uses the KCl + bananas etc...  Other medical issues as noted...   Patient's Medications  New Prescriptions   No medications on file  Previous Medications   AMLODIPINE (NORVASC) 5 MG TABLET    TAKE ONE  TABLET BY MOUTH ONE TIME DAILY   ASPIRIN 81 MG TABLET    Take 81 mg by mouth daily.     CHOLECALCIFEROL (VITAMIN D) 2000 UNITS CAPS    Take 1 capsule by mouth daily.     LATANOPROST (XALATAN) 0.005 % OPHTHALMIC SOLUTION    Place 1 drop into the left eye at bedtime.     MULTIPLE VITAMIN (MULTIVITAMIN) CAPSULE    Take 1 capsule by mouth daily.     POTASSIUM CHLORIDE SA (KLOR-CON M20) 20 MEQ TABLET    Take 1 tablet (20 mEq total) by mouth 2 (two) times daily.   SIMVASTATIN (ZOCOR) 40 MG TABLET    Take 1/2 tablet by mouth daily   VITAMIN C (ASCORBIC ACID) 500 MG TABLET    Take 500 mg by mouth daily.    Modified Medications   No medications on file  Discontinued Medications   No medications on file

## 2013-05-03 ENCOUNTER — Ambulatory Visit (INDEPENDENT_AMBULATORY_CARE_PROVIDER_SITE_OTHER)
Admission: RE | Admit: 2013-05-03 | Discharge: 2013-05-03 | Disposition: A | Discharge status: Home / Self Care | Payer: Medicare Other | Source: Ambulatory Visit | Attending: Pulmonary Disease | Admitting: Pulmonary Disease

## 2013-05-03 DIAGNOSIS — C3490 Malignant neoplasm of unspecified part of unspecified bronchus or lung: Secondary | ICD-10-CM

## 2013-05-03 MED ORDER — IOHEXOL 300 MG/ML  SOLN
80.0000 mL | Freq: Once | INTRAMUSCULAR | Status: AC | PRN
  Administered 2013-05-03: 80 mL via INTRAVENOUS

## 2013-05-04 ENCOUNTER — Other Ambulatory Visit: Payer: Self-pay | Admitting: Radiology

## 2013-05-04 ENCOUNTER — Other Ambulatory Visit: Payer: Self-pay | Admitting: Pulmonary Disease

## 2013-05-05 ENCOUNTER — Other Ambulatory Visit: Payer: Self-pay | Admitting: Radiology

## 2013-05-05 ENCOUNTER — Encounter (HOSPITAL_COMMUNITY): Payer: Self-pay | Admitting: Pharmacy Technician

## 2013-05-06 ENCOUNTER — Ambulatory Visit (HOSPITAL_COMMUNITY): Admission: RE | Admit: 2013-05-06 | Payer: Medicare Other | Source: Ambulatory Visit

## 2013-05-06 ENCOUNTER — Ambulatory Visit (HOSPITAL_COMMUNITY)
Admission: RE | Admit: 2013-05-06 | Discharge: 2013-05-06 | Disposition: A | Discharge status: Home / Self Care | Payer: Medicare Other | Source: Ambulatory Visit | Attending: Pulmonary Disease | Admitting: Pulmonary Disease

## 2013-05-06 ENCOUNTER — Other Ambulatory Visit: Payer: Self-pay | Admitting: Pulmonary Disease

## 2013-05-06 DIAGNOSIS — C770 Secondary and unspecified malignant neoplasm of lymph nodes of head, face and neck: Secondary | ICD-10-CM | POA: Insufficient documentation

## 2013-05-06 DIAGNOSIS — C3490 Malignant neoplasm of unspecified part of unspecified bronchus or lung: Secondary | ICD-10-CM | POA: Insufficient documentation

## 2013-05-06 HISTORY — PX: LYMPH NODE BIOPSY: SHX201

## 2013-05-06 LAB — CBC
HCT: 41.2 % (ref 39.0–52.0)
Hemoglobin: 14.5 g/dL (ref 13.0–17.0)
MCH: 31.3 pg (ref 26.0–34.0)
MCHC: 35.2 g/dL (ref 30.0–36.0)
MCV: 88.8 fL (ref 78.0–100.0)

## 2013-05-06 MED ORDER — SODIUM CHLORIDE 0.9 % IV SOLN: INTRAVENOUS | Status: DC

## 2013-05-06 NOTE — Procedures (Signed)
[place] core biopsy R supraclav LN 18g x3 to surg path No complication No blood loss. See complete dictation in [facility].

## 2013-05-11 ENCOUNTER — Telehealth: Payer: Self-pay | Admitting: Pulmonary Disease

## 2013-05-11 NOTE — Telephone Encounter (Signed)
Pt is calling about his biopsy results from the procedure done on 5/22.  SN please advise if you have seen these results.  Thanks  Allergies  Allergen Reactions  . Niacin     REACTION: Intol to Niacin w/ flushing in large doses

## 2013-05-11 NOTE — Telephone Encounter (Signed)
Per SN---  Still not reported on yet.   SN stated that he will call pathology and let him know by the days end.

## 2013-05-12 NOTE — Telephone Encounter (Signed)
SN spoke with pt about his biopsy results and referral has been placed for the pt to have appt with Dr. Shirline Frees.  Pt is aware and nothing further is needed.

## 2013-05-13 ENCOUNTER — Telehealth: Payer: Self-pay | Admitting: Internal Medicine

## 2013-05-13 NOTE — Telephone Encounter (Signed)
Pt scheduled to see Dr. Arbutus Ped 06/03 @ 3:30.  Welcome packet mailed.

## 2013-05-17 ENCOUNTER — Telehealth: Payer: Self-pay | Admitting: *Deleted

## 2013-05-17 NOTE — Telephone Encounter (Signed)
Spoke with pt and wife today regarding appt.  Appt changed to 05/18/13 at 1:45 lab/2:15 Dr. Marrion Coy FC.  They verbalized understanding of time and place of appt

## 2013-05-18 ENCOUNTER — Telehealth: Payer: Self-pay | Admitting: *Deleted

## 2013-05-18 ENCOUNTER — Other Ambulatory Visit: Payer: Medicare Other | Admitting: Lab

## 2013-05-18 ENCOUNTER — Encounter: Payer: Self-pay | Admitting: Internal Medicine

## 2013-05-18 ENCOUNTER — Ambulatory Visit (HOSPITAL_BASED_OUTPATIENT_CLINIC_OR_DEPARTMENT_OTHER): Payer: Medicare Other | Admitting: Internal Medicine

## 2013-05-18 ENCOUNTER — Ambulatory Visit: Payer: Medicare Other

## 2013-05-18 ENCOUNTER — Telehealth: Payer: Self-pay | Admitting: Internal Medicine

## 2013-05-18 ENCOUNTER — Ambulatory Visit: Payer: Medicare Other | Admitting: Internal Medicine

## 2013-05-18 DIAGNOSIS — C3490 Malignant neoplasm of unspecified part of unspecified bronchus or lung: Secondary | ICD-10-CM | POA: Insufficient documentation

## 2013-05-18 DIAGNOSIS — F17200 Nicotine dependence, unspecified, uncomplicated: Secondary | ICD-10-CM

## 2013-05-18 DIAGNOSIS — F411 Generalized anxiety disorder: Secondary | ICD-10-CM

## 2013-05-18 NOTE — Telephone Encounter (Signed)
Per staff message and POF I have scheduled appts.  JMW  

## 2013-05-18 NOTE — Telephone Encounter (Signed)
gv and printed appt sched and avs...MW added tx...MRI schedule at GI

## 2013-05-18 NOTE — Patient Instructions (Signed)
You are recently diagnosed with small cell lung cancer. We discussed treatment options including systemic chemotherapy.

## 2013-05-18 NOTE — Progress Notes (Signed)
[facility] [phone]   [phone]  CONSULT NOTE  REFERRING PHYSICIAN: Dr. Alroy Dust  REASON FOR CONSULTATION:  79 years old white male recently diagnosed with lung cancer.  HPI TUNG PUSTEJOVSKY is a 79 y.o. male was past medical history significant for multiple medical problems including history of COPD, hypertension, peripheral vascular disease, dyslipidemia, questionable diabetes mellitus, benign prostatic hypertrophy, degenerative joint disease, vitamin D deficiency, history of skin cancer as well as glaucoma in addition to long history of smoking. In early May of 2014 the patient was brushing his teeth and he noticed a sore tooth in the right side. He saw his dentist and noticed a lump over the right neck area. He was treated with a course of antibiotic with some improvement in the left neck lump but not complete resolution. The patient was referred to his primary care physician Dr. Kriste Basque who ordered CT of the neck. This was performed on 04/30/2013 and it showed pathologic right-sided lymphadenopathy likely reflecting metastatic squamous cell carcinoma of the lung. There was started right paratracheal mass that was incompletely evaluated but displacing the SVC anteriorly. CT of the chest was performed on 05/05/2013 and it showed extensive mediastinal, right hilar, right cervical and right supraclavicular lymphadenopathy concerning for malignancy. Low right cervical and right supraclavicular lymphadenopathy is noted, measuring up to 1.7 x 3.0 cm. There is also extensive mediastinal lymphadenopathy, with the largest nodal mass in the low right paratracheal station measuring approximately 4.1 x 6.0 cm. Enlarged lymph nodes are present in the subcarinal, right hilar, high right paratracheal and superior mediastinal nodal stations. The largest of these right paratracheal lymph nodes severely displace and narrow the superior vena cava, however, the superior vena  cava remains patent at this time.  In May 22nd 2014 the patient underwent ultrasound-guided core biopsy of the right supraclavicular adenopathy by interventional radiology. The final pathology (Accession: [id number]) was positive for small cell carcinoma. Immunohistochemical stains are performed. The tumor is positive for cytokeratin AE1/AE3, CD56 and synaptophysin. The tumor shows weak patchy positivity for chromogranin. TTF-1 and CD45 are negative.The morphology coupled with the staining pattern is consistent the above diagnosis. Dr. Kriste Basque kindly referred the patient to me today for further evaluation and recommendation regarding treatment of his recently diagnosed small cell lung cancer. When seen today the patient is feeling fine with no specific complaints except for being anxious in addition to occasional constipation and history of foot drop. He denied having any significant weight loss or night sweats. He denied having any chest pain, shortness breath, cough or hemoptysis.  Family history significant for mother with history of cervical cancer, sister with history of breast cancer and father had a history of prostate cancer. The patient is married and has 3 children. He was accompanied by his wife Chales Abrahams. He is currently retired and used to work in [employer]. He has a history of smoking one pack per day for around 60 years and unfortunately he continues to smoke and I strongly encouraged him to quit smoking and altered him to smoke cessation program. He drinks alcohol occasionally and no history of drug abuse.  @SFHPI @  Past Medical History  Diagnosis Date  . OSA (obstructive sleep apnea)   . COPD (chronic obstructive pulmonary disease)   . Cigarette smoker   . Hypertension   . Venous insufficiency   . Hypercholesteremia   . Borderline diabetes mellitus   . Diverticulosis of colon   . Hx of colonic  polyps   . BPH (benign prostatic hyperplasia)   . DJD (degenerative joint disease)     . Lumbar back pain   . Leg cramps   . History of skin cancer   . History of shingles     Past Surgical History  Procedure Laterality Date  . Appendectomy    . Tonsillectomy and adenoidectomy    . Left inguinal hernia repair  1998    Dr. Earlene Plater  . Lumbar laminectomy  1997    Dr. Roxan Hockey    Family History  Problem Relation Age of Onset  . Cancer Mother     Social History History  Substance Use Topics  . Smoking status: Current Every Day Smoker -- 1.00 packs/day for 50 years    Types: Cigarettes  . Smokeless tobacco: Never Used  . Alcohol Use: Yes     Comment: rare use    Allergies  Allergen Reactions  . Niacin     REACTION: Intol to Niacin w/ flushing in large doses    Current Outpatient Prescriptions  Medication Sig Dispense Refill  . amLODipine (NORVASC) 5 MG tablet TAKE ONE TABLET BY MOUTH ONE TIME DAILY  90 tablet  0  . aspirin 81 MG tablet Take 81 mg by mouth daily.        . Cholecalciferol (VITAMIN D) 2000 UNITS CAPS Take 1 capsule by mouth daily.        Marland Kitchen latanoprost (XALATAN) 0.005 % ophthalmic solution Place 1 drop into the left eye at bedtime.        . Multiple Vitamin (MULTIVITAMIN) capsule Take 1 capsule by mouth daily.        . potassium chloride SA (KLOR-CON M20) 20 MEQ tablet Take 1 tablet (20 mEq total) by mouth 2 (two) times daily.  180 tablet  3  . simvastatin (ZOCOR) 40 MG tablet Take 1/2 tablet by mouth daily      . Trolamine Salicylate (MYOFLEX EX) Apply 1 application topically 2 (two) times daily as needed. For pain  AS NEEDED      . vitamin C (ASCORBIC ACID) 500 MG tablet Take 500 mg by mouth daily.         No current facility-administered medications for this visit.    Review of Systems  A comprehensive review of systems was negative except for: Constitutional: positive for fatigue Behavioral/Psych: positive for anxiety  Physical Exam  ZOX:WRUEA, healthy, no distress, well nourished and well developed SKIN: skin color, texture,  turgor are normal HEAD: Normocephalic, No masses, lesions, tenderness or abnormalities EYES: normal, PERRLA EARS: External ears normal OROPHARYNX:no exudate and no erythema  NECK: supple, no adenopathy LYMPH:  no palpable lymphadenopathy LUNGS: clear to auscultation  HEART: regular rate & rhythm, no murmurs and no gallops ABDOMEN:abdomen soft, non-tender, normal bowel sounds and no masses or organomegaly BACK: Back symmetric, no curvature. EXTREMITIES:no joint deformities, effusion, or inflammation, no edema, no skin discoloration, no clubbing  NEURO: alert & oriented x 3 with fluent speech, no focal motor/sensory deficits  PERFORMANCE STATUS: ECOG 1  LABORATORY DATA: Lab Results  Component Value Date   WBC 8.4 05/06/2013   HGB 14.5 05/06/2013   HCT 41.2 05/06/2013   MCV 88.8 05/06/2013   PLT 202 05/06/2013      Chemistry      Component Value Date/Time   NA 140 04/27/2013 1631   K 3.5 04/27/2013 1631   CL 102 04/27/2013 1631   CO2 32 04/27/2013 1631   BUN 14 04/27/2013 1631  CREATININE 1.0 04/27/2013 1631      Component Value Date/Time   CALCIUM 9.0 04/27/2013 1631   ALKPHOS 42 06/22/2012 0758   AST 24 06/22/2012 0758   ALT 21 06/22/2012 0758   BILITOT 2.0* 06/22/2012 0758       RADIOGRAPHIC STUDIES: Ct Soft Tissue Neck W Contrast  04/30/2013   *RADIOLOGY REPORT*  Clinical Data: Nontender neck mass.  60-year history of smoking.  CT NECK WITH CONTRAST  Technique:  Multidetector CT imaging of the neck was performed with intravenous contrast.  Contrast: 75mL OMNIPAQUE IOHEXOL 300 MG/ML  SOLN  Comparison: Chest x-ray 06/16/2012.  Findings: Suprahyoid neck:  Major and minor salivary glands are unremarkable.  Specifically, no submandibular gland enlargement on the right.  No evidence for periodontal disease or floor of mouth abscess.  Within limits of detection on CT, no visible mucosal lesion.  Normal appearing nasopharynx, oro- and hypopharynx, and parapharyngeal regions.  Larynx:  Normal  larynx as well as supraglottic and subglottic regions.  Infrahyoid neck:  13 x 14 mm right thyroid hypoattenuating lesion; consider tissue sampling for further evaluation.  Lymph nodes:  There is pathologic lymphadenopathy on the right. Right Level IIA node measures 25 x 28 mm.  Level IV node on the right displaces the internal jugular vein anteriorly, and measures 20 x 21 mm.  Level VB adenopathy, with a dominant 21 x 27 lymph node.  Upper chest/mediastinum:  There is bulky right paratracheal adenopathy which is displacing the IVC anteriorly. Concern raised for squamous cell carcinoma.  Cross-sectional measurements are 34 x 64 mm on image 77 which is the most inferior extent of the CT neck. Plain film and CT chest recommended for further evaluation to exclude impending SVC syndrome.  Clear lung apices.  Nonstenotic atheromatous change proximal great vessels.  Additional:  Minor atheromatous change at the carotid bifurcations. Cervical spondylosis with disc space narrowing most notable C5-C6. Visualized intracranial compartment unremarkable.  IMPRESSION: Pathologic right-sided lymphadenopathy likely reflects metastatic squamous cell carcinoma of the lung.  Bulky right paratracheal mass is incompletely evaluated, but displaces the SVC anteriorly.  See discussion above.  Findings discussed with ordering provider.   Original Report Authenticated By: Davonna Belling, M.D.   Ct Chest W Contrast  05/03/2013   *RADIOLOGY REPORT*  Clinical Data: Evaluate mediastinal lymphadenopathy noted on recent neck CT.  CT CHEST WITH CONTRAST  Technique:  Multidetector CT imaging of the chest was performed following the standard protocol during bolus administration of intravenous contrast.  Contrast: 80mL OMNIPAQUE IOHEXOL 300 MG/ML  SOLN  Comparison: CT of the neck with contrast 04/30/2013.  Findings:  Mediastinum: Low right cervical and right supraclavicular lymphadenopathy is noted, measuring up to 1.7 x 3.0 cm (image 6 of series 2).   There is also extensive mediastinal lymphadenopathy, with the largest nodal mass in the low right paratracheal station measuring approximately 4.1 x 6.0 cm.  Enlarged lymph nodes are present in the subcarinal, right hilar, high right paratracheal and superior mediastinal nodal stations. The largest of these right paratracheal lymph nodes severely displace and narrow the superior vena cava, however, the superior vena cava remains patent at this time.  Heart size is normal. There is no significant pericardial fluid, thickening or pericardial calcification. There is atherosclerosis of the thoracic aorta, the great vessels of the mediastinum and the coronary arteries, including calcified atherosclerotic plaque in the left anterior descending, left circumflex and right coronary arteries. Calcifications of the aortic valve.  Esophagus is unremarkable in appearance.  Lungs/Pleura:  There is a focal area of mild architectural distortion and patchy nodularity in the right upper lobe, with the largest single nodule in this region measuring approximately 1 cm (image 20 of series 3).  Several other smaller nodules are also seen scattered throughout the right lung.  No contralateral lung nodules are noted.  No pleural effusions.  Upper Abdomen: Calcifications in the renal hila bilaterally are favored to be vascular, but may alternatively represent nonobstructive calculi. In addition, there is an exophytic intermediate attenuation (25 HU) lesion extending from the upper pole of the right kidney.  Musculoskeletal: There are no aggressive appearing lytic or blastic lesions noted in the visualized portions of the skeleton.  IMPRESSION: 1.  Extensive mediastinal, right hilar, right cervical and right supraclavicular lymphadenopathy, as above.  Findings are highly concerning for malignancy, and differential considerations would include lymphoma and primary lung neoplasm such as a small cell carcinoma.  Biopsy is recommended to  establish a tissue diagnosis. 2.  Area of mild architectural distortion of patchy nodularity in the right upper lobe is nonspecific but favored to be of infectious or inflammatory etiology, potentially related to intermittent obstruction of bronchi leading to this region.  Attention on follow- up studies is recommended to ensure the stability or resolution of these nodules. 3.  Intermediate attenuation lesion extending exophytically from the upper pole of the right kidney is favored to represent a minimally complex (likely proteinaceous) cyst.   Original Report Authenticated By: Trudie Reed, M.D.   US Biopsy  05/06/2013   *RADIOLOGY REPORT*  Clinical data:  Mediastinal, right hilar, right supraclavicular, and cervical adenopathy  ULTRASOUND-GUIDED CORE BIOPSY RIGHT SUPRACLAVICULAR ADENOPATHY  Comparison:  CT 05/03/2013  Technique and findings: The procedure, risks (including but not limited to bleeding, infection, organ damage), benefits, and alternatives were explained to the patient.  Questions regarding the procedure were encouraged and answered.  The patient understands and consents to the procedure.Survey ultrasound of the right neck was performed in the supraclavicular adenopathy was identified. Operator donned sterile gloves and mask.   Site was marked, prepped with Betadine, draped in usual sterile fashion, infiltrated locally with 1% lidocaine.  Under real time ultrasound guidance, a 17 gauge trocar needle was advanced to the margin of the lesion.  Once needle tip position was confirmed, coaxial 18- gauge core biopsy samples were obtained, submitted in saline to surgical pathology.  The guide needle was removed.  The patient tolerated the procedure well.  No immediate complication. Postprocedure scans show no hematoma.  IMPRESSION: Technically successful ultrasound-guided core biopsy of right supraclavicular adenopathy.   Original Report Authenticated By: D. Andria Rhein, MD    ASSESSMENT: This is  a very pleasant 79 years old white male with recently diagnosed small cell lung cancer, limited stage disease so far, pending further staging workup.   PLAN: I have a lengthy discussion with the patient and his wife today about his disease stage, prognosis and treatment options. I recommended for the patient to complete the staging workup by ordering a PET scan as well as MRI of the brain. I have a lengthy discussion with the patient about his treatment options including palliative care but I recommended for him treatment with systemic chemotherapy with carboplatin for AUC of 5 on day 1 and etoposide 120 mg/M2 on days 1, 2 and 3 with Neulasta support on day 4. I discussed with the patient adverse effect of the chemotherapy including but not limited to alopecia, myelosuppression, nausea and vomiting, peripheral neuropathy, liver or in  dysfunction. If the patient has no evidence for disease metastasis on the staging workup, I would consider referring him to radiation oncology for consideration of concurrent radiotherapy with chemotherapy. I will arrange for the patient to have a chemotherapy education class this week. He is expected to start the first cycle of his chemotherapy next week. I will call his pharmacy with prescription for Compazine 10 mg by mouth every 6 hours as needed for nausea. The patient would come back for followup visit in one week for evaluation and discussion of his scan results before starting the first cycle of his chemotherapy. The patient and his wife have several questions and I answered them completely to their satisfactions. He was advised to call me immediately if he has any concerning symptoms in the interval. All questions were answered. The patient knows to call the clinic with any problems, questions or concerns. We can certainly see the patient much sooner if necessary.  Thank you so much for allowing me to participate in the care of Misael R Armendarez. I will continue to  follow up the patient with you and assist in his care.  I spent 55 minutes counseling the patient face to face. The total time spent in the appointment was 80 minutes.   Jamier Urbas K. 05/18/2013, 5:22 PM

## 2013-05-18 NOTE — Progress Notes (Signed)
Checked in new patient. No financial issues. He does have a POA, but didn't bring forms with him.

## 2013-05-20 ENCOUNTER — Other Ambulatory Visit: Payer: Medicare Other

## 2013-05-20 ENCOUNTER — Ambulatory Visit
Admission: RE | Admit: 2013-05-20 | Discharge: 2013-05-20 | Disposition: A | Discharge status: Home / Self Care | Payer: Medicare Other | Source: Ambulatory Visit | Attending: Internal Medicine | Admitting: Internal Medicine

## 2013-05-20 ENCOUNTER — Other Ambulatory Visit: Payer: Self-pay | Admitting: Internal Medicine

## 2013-05-20 ENCOUNTER — Telehealth: Payer: Self-pay | Admitting: Pulmonary Disease

## 2013-05-20 DIAGNOSIS — Z139 Encounter for screening, unspecified: Secondary | ICD-10-CM

## 2013-05-20 NOTE — Telephone Encounter (Signed)
I spoke with the pt spouse and asked if there was anything that was needed. She states she just wanted to give SN an update on what was happening with cancer diagnosis. She states everything is going well. I will forward as an FYI. Carron Curie, CMA

## 2013-05-20 NOTE — Telephone Encounter (Signed)
Copied from MyChart:  Appointment Request From: Page Spiro      With Provider: Michele Mcalpine, MD [-Primary Care Physician-]      Preferred Date Range: Any      Preferred Times: Any      Reason for visit: Annual Physical      Comments:   6/4/ Lorin Picket, we're learning the ropes. Dr. Arbutus Ped is impressive. He's the cancer Clydie Braun, you the whole body Captain. Educational meeting Thurs, MRI Fri, [employer]. Tues: Chemo; labs, Dr. Arbutus Ped; 3 hour infusion. chemo Wed & Thurs; injection by nurse Friday. Feel sure you know the protocol. For now he's calling it "limited". He will have Nadine Counts stop smoking/welbutrin Chales Abrahams

## 2013-05-21 ENCOUNTER — Ambulatory Visit
Admission: RE | Admit: 2013-05-21 | Discharge: 2013-05-21 | Disposition: A | Discharge status: Home / Self Care | Payer: Medicare Other | Source: Ambulatory Visit | Attending: Internal Medicine | Admitting: Internal Medicine

## 2013-05-21 MED ORDER — GADOBENATE DIMEGLUMINE 529 MG/ML IV SOLN
15.0000 mL | Freq: Once | INTRAVENOUS | Status: AC | PRN
  Administered 2013-05-21: 15 mL via INTRAVENOUS

## 2013-05-24 ENCOUNTER — Telehealth: Payer: Self-pay | Admitting: *Deleted

## 2013-05-24 ENCOUNTER — Encounter (HOSPITAL_COMMUNITY)
Admission: RE | Admit: 2013-05-24 | Discharge: 2013-05-24 | Disposition: A | Discharge status: Home / Self Care | Payer: Medicare Other | Source: Ambulatory Visit | Attending: Internal Medicine | Admitting: Internal Medicine

## 2013-05-24 DIAGNOSIS — C3490 Malignant neoplasm of unspecified part of unspecified bronchus or lung: Secondary | ICD-10-CM | POA: Insufficient documentation

## 2013-05-24 MED ORDER — FLUDEOXYGLUCOSE F - 18 (FDG) INJECTION
18.4000 | Freq: Once | INTRAVENOUS | Status: AC | PRN
  Administered 2013-05-24: 18.4 via INTRAVENOUS

## 2013-05-24 NOTE — Telephone Encounter (Signed)
Spoke with wife today.  She has concerns regarding when to call PCP during treatment.  I explained reasons to call PCP and when to call Dr. Arbutus Ped.  She verbalized understanding

## 2013-05-25 ENCOUNTER — Other Ambulatory Visit (HOSPITAL_BASED_OUTPATIENT_CLINIC_OR_DEPARTMENT_OTHER): Payer: Medicare Other | Admitting: Lab

## 2013-05-25 ENCOUNTER — Encounter: Payer: Self-pay | Admitting: Internal Medicine

## 2013-05-25 ENCOUNTER — Ambulatory Visit (HOSPITAL_BASED_OUTPATIENT_CLINIC_OR_DEPARTMENT_OTHER): Payer: Medicare Other | Admitting: Internal Medicine

## 2013-05-25 ENCOUNTER — Telehealth: Payer: Self-pay | Admitting: Internal Medicine

## 2013-05-25 ENCOUNTER — Ambulatory Visit (HOSPITAL_BASED_OUTPATIENT_CLINIC_OR_DEPARTMENT_OTHER): Payer: Medicare Other

## 2013-05-25 DIAGNOSIS — Z9221 Personal history of antineoplastic chemotherapy: Secondary | ICD-10-CM

## 2013-05-25 DIAGNOSIS — C3410 Malignant neoplasm of upper lobe, unspecified bronchus or lung: Secondary | ICD-10-CM

## 2013-05-25 DIAGNOSIS — C778 Secondary and unspecified malignant neoplasm of lymph nodes of multiple regions: Secondary | ICD-10-CM

## 2013-05-25 DIAGNOSIS — Z5111 Encounter for antineoplastic chemotherapy: Secondary | ICD-10-CM

## 2013-05-25 DIAGNOSIS — R599 Enlarged lymph nodes, unspecified: Secondary | ICD-10-CM

## 2013-05-25 HISTORY — DX: Personal history of antineoplastic chemotherapy: Z92.21

## 2013-05-25 LAB — COMPREHENSIVE METABOLIC PANEL (CC13)
CO2: 26 mEq/L (ref 22–29)
Calcium: 9.3 mg/dL (ref 8.4–10.4)
Chloride: 105 mEq/L (ref 98–107)
Creatinine: 0.9 mg/dL (ref 0.7–1.3)
Glucose: 120 mg/dl — ABNORMAL HIGH (ref 70–99)
Sodium: 144 mEq/L (ref 136–145)
Total Bilirubin: 1.27 mg/dL — ABNORMAL HIGH (ref 0.20–1.20)
Total Protein: 6.8 g/dL (ref 6.4–8.3)

## 2013-05-25 LAB — CBC WITH DIFFERENTIAL/PLATELET
Basophils Absolute: 0.1 10*3/uL (ref 0.0–0.1)
Eosinophils Absolute: 0.1 10*3/uL (ref 0.0–0.5)
HGB: 13.9 g/dL (ref 13.0–17.1)
LYMPH%: 32.1 % (ref 14.0–49.0)
MCV: 88.7 fL (ref 79.3–98.0)
MONO%: 10.2 % (ref 0.0–14.0)
NEUT#: 4.8 10*3/uL (ref 1.5–6.5)
Platelets: 216 10*3/uL (ref 140–400)

## 2013-05-25 MED ORDER — SODIUM CHLORIDE 0.9 % IV SOLN
Freq: Once | INTRAVENOUS | Status: AC
  Administered 2013-05-25: 16:00:00 via INTRAVENOUS

## 2013-05-25 MED ORDER — SODIUM CHLORIDE 0.9 % IV SOLN
419.5000 mg | Freq: Once | INTRAVENOUS | Status: AC
  Administered 2013-05-25: 420 mg via INTRAVENOUS
  Filled 2013-05-25: qty 42

## 2013-05-25 MED ORDER — SODIUM CHLORIDE 0.9 % IV SOLN
120.0000 mg/m2 | Freq: Once | INTRAVENOUS | Status: AC
  Administered 2013-05-25: 230 mg via INTRAVENOUS
  Filled 2013-05-25: qty 11.5

## 2013-05-25 MED ORDER — PROCHLORPERAZINE MALEATE 10 MG PO TABS: 10.0000 mg | ORAL_TABLET | Freq: Four times a day (QID) | ORAL | Status: AC | PRN

## 2013-05-25 MED ORDER — DEXAMETHASONE SODIUM PHOSPHATE 20 MG/5ML IJ SOLN
20.0000 mg | Freq: Once | INTRAMUSCULAR | Status: AC
  Administered 2013-05-25: 20 mg via INTRAVENOUS

## 2013-05-25 MED ORDER — ONDANSETRON 16 MG/50ML IVPB (CHCC)
16.0000 mg | Freq: Once | INTRAVENOUS | Status: AC
  Administered 2013-05-25: 16 mg via INTRAVENOUS

## 2013-05-25 NOTE — Telephone Encounter (Signed)
Per staff phone call and POF I have schedueld appts.  JMW  

## 2013-05-25 NOTE — Progress Notes (Signed)
[facility] [phone]   Fax:([phone]) [phone]  OFFICE PROGRESS NOTE  Michele Mcalpine, MD [address] [place] [address]  DIAGNOSIS: Limited stage small cell lung cancer diagnosed in May of 2014.  PRIOR THERAPY: None.  CURRENT THERAPY: Systemic chemotherapy with carboplatin for AUC of 5 on day 1 and etoposide 120 mg/M2 on days 1, 2 and 3 with Neulasta support on day 4 every 3 weeks. First cycle today.  INTERVAL HISTORY: Cory [name] 79 y.o. male returns to the clinic today for followup visit accompanied his wife. The patient is feeling fine today with no specific complaints except for increasing swelling in the right side of the neck was occasionally stiffness in that area. The patient denied having any significant chest pain, shortness breath, cough or hemoptysis. He denied having any significant weight loss or night sweats. He is here today to start the first cycle of systemic chemotherapy with carboplatin and etoposide. The patient had MRI of the brain as well as PET scan performed recently and he is here for evaluation and discussion of his scan results.  MEDICAL HISTORY: Past Medical History  Diagnosis Date  . OSA (obstructive sleep apnea)   . COPD (chronic obstructive pulmonary disease)   . Cigarette smoker   . Hypertension   . Venous insufficiency   . Hypercholesteremia   . Borderline diabetes mellitus   . Diverticulosis of colon   . Hx of colonic polyps   . BPH (benign prostatic hyperplasia)   . DJD (degenerative joint disease)   . Lumbar back pain   . Leg cramps   . History of skin cancer   . History of shingles     ALLERGIES:  is allergic to niacin.  MEDICATIONS:  Current Outpatient Prescriptions  Medication Sig Dispense Refill  . amLODipine (NORVASC) 5 MG tablet TAKE ONE TABLET BY MOUTH ONE TIME DAILY  90 tablet  0  . aspirin 81 MG tablet Take 81 mg by mouth daily.        . Cholecalciferol (VITAMIN D) 2000 UNITS CAPS Take 1  capsule by mouth daily.        Marland Kitchen latanoprost (XALATAN) 0.005 % ophthalmic solution Place 1 drop into the left eye at bedtime.        . Multiple Vitamin (MULTIVITAMIN) capsule Take 1 capsule by mouth daily.        . potassium chloride SA (KLOR-CON M20) 20 MEQ tablet Take 1 tablet (20 mEq total) by mouth 2 (two) times daily.  180 tablet  3  . simvastatin (ZOCOR) 40 MG tablet Take 1/2 tablet by mouth daily      . Trolamine Salicylate (MYOFLEX EX) Apply 1 application topically 2 (two) times daily as needed. For pain  AS NEEDED      . vitamin C (ASCORBIC ACID) 500 MG tablet Take 500 mg by mouth daily.         No current facility-administered medications for this visit.    SURGICAL HISTORY:  Past Surgical History  Procedure Laterality Date  . Appendectomy    . Tonsillectomy and adenoidectomy    . Left inguinal hernia repair  1998    Dr. Earlene Plater  . Lumbar laminectomy  1997    Dr. Roxan Hockey    REVIEW OF SYSTEMS:  A comprehensive review of systems was negative except for: Constitutional: positive for fatigue   PHYSICAL EXAMINATION: General appearance: alert, cooperative, fatigued and no distress Head: Normocephalic, without obvious abnormality, atraumatic Neck:  no adenopathy Lymph nodes: Palpable right supraclavicular lymphadenopathy measuring around 3 CM in size. Resp: clear to auscultation bilaterally Back: symmetric, no curvature. ROM normal. No CVA tenderness. Cardio: regular rate and rhythm, S1, S2 normal, no murmur, click, rub or gallop GI: soft, non-tender; bowel sounds normal; no masses,  no organomegaly Extremities: extremities normal, atraumatic, no cyanosis or edema Neurologic: Alert and oriented X 3, normal strength and tone. Normal symmetric reflexes. Normal coordination and gait  ECOG PERFORMANCE STATUS: 1 - Symptomatic but completely ambulatory  Blood pressure 134/63, pulse 79, temperature 97 F (36.1 C), temperature source Oral, resp. rate 18, height 5\' 9"  (1.753 m), weight  160 lb 11.2 oz (72.893 kg).  LABORATORY DATA: Lab Results  Component Value Date   WBC 8.7 05/25/2013   HGB 13.9 05/25/2013   HCT 40.2 05/25/2013   MCV 88.7 05/25/2013   PLT 216 05/25/2013      Chemistry      Component Value Date/Time   NA 140 04/27/2013 1631   K 3.5 04/27/2013 1631   CL 102 04/27/2013 1631   CO2 32 04/27/2013 1631   BUN 14 04/27/2013 1631   CREATININE 1.0 04/27/2013 1631      Component Value Date/Time   CALCIUM 9.0 04/27/2013 1631   ALKPHOS 42 06/22/2012 0758   AST 24 06/22/2012 0758   ALT 21 06/22/2012 0758   BILITOT 2.0* 06/22/2012 0758       RADIOGRAPHIC STUDIES: Dg Eye Foreign Body  05/20/2013   *RADIOLOGY REPORT*  Clinical Data: 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  Findings: Views of the orbits were obtained with the patient looking to the left and looking to the right.  No orbital metallic foreign body is seen.  No bony abnormality is noted.  The paranasal sinuses that are visualized are clear.  Multiple dental implants are noted.  IMPRESSION: No orbital metallic foreign body.   Original Report Authenticated By: Dwyane Dee, M.D.   Ct Soft Tissue Neck W Contrast  04/30/2013   *RADIOLOGY REPORT*  Clinical Data: Nontender neck mass.  60-year history of smoking.  CT NECK WITH CONTRAST  Technique:  Multidetector CT imaging of the neck was performed with intravenous contrast.  Contrast: 75mL OMNIPAQUE IOHEXOL 300 MG/ML  SOLN  Comparison: Chest x-ray 06/16/2012.  Findings: Suprahyoid neck:  Major and minor salivary glands are unremarkable.  Specifically, no submandibular gland enlargement on the right.  No evidence for periodontal disease or floor of mouth abscess.  Within limits of detection on CT, no visible mucosal lesion.  Normal appearing nasopharynx, oro- and hypopharynx, and parapharyngeal regions.  Larynx:  Normal larynx as well as supraglottic and subglottic regions.  Infrahyoid neck:  13 x 14  mm right thyroid hypoattenuating lesion; consider tissue sampling for further evaluation.  Lymph nodes:  There is pathologic lymphadenopathy on the right. Right Level IIA node measures 25 x 28 mm.  Level IV node on the right displaces the internal jugular vein anteriorly, and measures 20 x 21 mm.  Level VB adenopathy, with a dominant 21 x 27 lymph node.  Upper chest/mediastinum:  There is bulky right paratracheal adenopathy which is displacing the IVC anteriorly. Concern raised for squamous cell carcinoma.  Cross-sectional measurements are 34 x 64 mm on image 77 which is the most inferior extent of the CT neck. Plain film and CT chest recommended for further evaluation to exclude impending SVC syndrome.  Clear lung apices.  Nonstenotic atheromatous change proximal great vessels.  Additional:  Minor atheromatous change at the carotid bifurcations. Cervical spondylosis with disc space narrowing most notable C5-C6. Visualized intracranial compartment unremarkable.  IMPRESSION: Pathologic right-sided lymphadenopathy likely reflects metastatic squamous cell carcinoma of the lung.  Bulky right paratracheal mass is incompletely evaluated, but displaces the SVC anteriorly.  See discussion above.  Findings discussed with ordering provider.   Original Report Authenticated By: Davonna Belling, M.D.   Ct Chest W Contrast  05/03/2013   *RADIOLOGY REPORT*  Clinical Data: Evaluate mediastinal lymphadenopathy noted on recent neck CT.  CT CHEST WITH CONTRAST  Technique:  Multidetector CT imaging of the chest was performed following the standard protocol during bolus administration of intravenous contrast.  Contrast: 80mL OMNIPAQUE IOHEXOL 300 MG/ML  SOLN  Comparison: CT of the neck with contrast 04/30/2013.  Findings:  Mediastinum: Low right cervical and right supraclavicular lymphadenopathy is noted, measuring up to 1.7 x 3.0 cm (image 6 of series 2).  There is also extensive mediastinal lymphadenopathy, with the largest nodal mass  in the low right paratracheal station measuring approximately 4.1 x 6.0 cm.  Enlarged lymph nodes are present in the subcarinal, right hilar, high right paratracheal and superior mediastinal nodal stations. The largest of these right paratracheal lymph nodes severely displace and narrow the superior vena cava, however, the superior vena cava remains patent at this time.  Heart size is normal. There is no significant pericardial fluid, thickening or pericardial calcification. There is atherosclerosis of the thoracic aorta, the great vessels of the mediastinum and the coronary arteries, including calcified atherosclerotic plaque in the left anterior descending, left circumflex and right coronary arteries. Calcifications of the aortic valve.  Esophagus is unremarkable in appearance.  Lungs/Pleura: There is a focal area of mild architectural distortion and patchy nodularity in the right upper lobe, with the largest single nodule in this region measuring approximately 1 cm (image 20 of series 3).  Several other smaller nodules are also seen scattered throughout the right lung.  No contralateral lung nodules are noted.  No pleural effusions.  Upper Abdomen: Calcifications in the renal hila bilaterally are favored to be vascular, but may alternatively represent nonobstructive calculi. In addition, there is an exophytic intermediate attenuation (25 HU) lesion extending from the upper pole of the right kidney.  Musculoskeletal: There are no aggressive appearing lytic or blastic lesions noted in the visualized portions of the skeleton.  IMPRESSION: 1.  Extensive mediastinal, right hilar, right cervical and right supraclavicular lymphadenopathy, as above.  Findings are highly concerning for malignancy, and differential considerations would include lymphoma and primary lung neoplasm such as a small cell carcinoma.  Biopsy is recommended to establish a tissue diagnosis. 2.  Area of mild architectural distortion of patchy  nodularity in the right upper lobe is nonspecific but favored to be of infectious or inflammatory etiology, potentially related to intermittent obstruction of bronchi leading to this region.  Attention on follow- up studies is recommended to ensure the stability or resolution of these nodules. 3.  Intermediate attenuation lesion extending exophytically from the upper pole of the right kidney is favored to represent a minimally complex (likely proteinaceous) cyst.   Original Report Authenticated By: Trudie Reed, M.D.   Mr Laqueta Jean GN Contrast  05/21/2013   *RADIOLOGY REPORT*  Clinical Data: Lung cancer.  Staging. History of hypertension. History of tobacco abuse.  History of COPD.  MRI HEAD WITHOUT AND WITH CONTRAST  Technique:  Multiplanar, multiecho pulse sequences  of the brain and surrounding structures were obtained according to standard protocol without and with intravenous contrast  Contrast: 15mL MULTIHANCE GADOBENATE DIMEGLUMINE 529 MG/ML IV SOLN  Comparison: None.  Findings: No acute stroke or hemorrhage.  No mass lesion or hydrocephalus.  Moderate atrophy.  Extensive chronic microvascular ischemic change. Widespread prominence perivascular spaces suggest longstanding hypertension.  Widely patent internal carotid arteries, basilar artery, and vertebral arteries.  No osseous lesions.  Pituitary and cerebellar tonsils unremarkable.  No cervical canal stenosis.  Post infusion imaging does not reveal clear-cut intracranial metastatic disease.  There are small foci of enhancement which can be seen on occasional sagittal images which cannot be correlated with similar foci of axial or coronal imaging, and are felt to represent  end on vessels.  Incompletely evaluated right cervical adenopathy.  No skull base or calvarial lesions.  Major dural venous sinuses appear patent.  No meningeal enhancement. No acute sinus disease.  Negative mastoids.  IMPRESSION: Moderate atrophy.  Extensive chronic microvascular  ischemic change. No intracranial metastatic disease is present.   Original Report Authenticated By: Davonna Belling, M.D.   Nm Pet Image Initial (pi) Skull Base To Thigh  05/24/2013   *RADIOLOGY REPORT*  Clinical Data: Initial treatment strategy for lung cancer.  NUCLEAR MEDICINE PET SKULL BASE TO THIGH  Fasting Blood Glucose:  114  Technique:  18.4 mCi F-18 FDG was injected intravenously. CT data was obtained and used for attenuation correction and anatomic localization only.  (This was not acquired as a diagnostic CT examination.) Additional exam technical data entered on technologist worksheet.  Comparison:  Chest CT 05/03/2013  Findings:  Neck: There is a 2.5 cm level three lymph node which is present on image 39 which does not have significant metabolic activity.  More inferiorly there is a large level IV lymph node adjacent to thyroid gland measuring 18 mm ( image 62) with intense metabolic activity ( SUV max = 46.9).  Additional hypermetabolic high right paratracheal lymph nodes and supraclavicular lymph node on the right.  There is hypermetabolic nodule within the left lobe of the thyroid gland.  Chest:  There is a extremely large right lower paratracheal lymph node measuring 4.8 cm with intense metabolic activity ( SUV max = 24).  This lymph nodes extend slightly left of the trachea (image 91) qualify as contralateral disease.  Within the right upper lobe there is a cluster of hypermetabolic nodules (image 87) with SUV max = 7.1.  The largest measures 12 mm on image 88.  Abdomen/Pelvis:  No abnormal hypermetabolic activity within the liver, pancreas, adrenal glands, or spleen.  No hypermetabolic lymph nodes in the abdomen or pelvis.  Skeleton:  No focal hypermetabolic activity to suggest skeletal metastasis.  IMPRESSION:  1.  Bulky hypermetabolic right paratracheal and supraclavicular adenopathy consistent with metastatic adenopathy.  Consider small cell lung cancer. 2. Right paratracheal adenopathy extends  just left of the trachea consistent with contralateral disease.  4.  Hypermetabolic nodule within the central right upper lobe may represent primary lesion. 5.  Hypermetabolic nodule within the left lobe of thyroid gland is indeterminate. 5.  Staging by FDG PET CT imaging T1 N3 M0   Original Report Authenticated By: Genevive Bi, M.D.   US Biopsy  05/06/2013   *RADIOLOGY REPORT*  Clinical data:  Mediastinal, right hilar, right supraclavicular, and cervical adenopathy  ULTRASOUND-GUIDED CORE BIOPSY RIGHT SUPRACLAVICULAR ADENOPATHY  Comparison:  CT 05/03/2013  Technique and findings: The procedure, risks (including but not limited to bleeding, infection, organ  damage), benefits, and alternatives were explained to the patient.  Questions regarding the procedure were encouraged and answered.  The patient understands and consents to the procedure.Survey ultrasound of the right neck was performed in the supraclavicular adenopathy was identified. Operator donned sterile gloves and mask.   Site was marked, prepped with Betadine, draped in usual sterile fashion, infiltrated locally with 1% lidocaine.  Under real time ultrasound guidance, a 17 gauge trocar needle was advanced to the margin of the lesion.  Once needle tip position was confirmed, coaxial 18- gauge core biopsy samples were obtained, submitted in saline to surgical pathology.  The guide needle was removed.  The patient tolerated the procedure well.  No immediate complication. Postprocedure scans show no hematoma.  IMPRESSION: Technically successful ultrasound-guided core biopsy of right supraclavicular adenopathy.   Original Report Authenticated By: D. Andria Rhein, MD    ASSESSMENT: This is a very pleasant 79 years old white male recently diagnosed with limited stage small cell lung cancer, with a right upper lobe lung mass as well as massive mediastinal and right supraclavicular lymphadenopathy diagnosed in May of 2014   PLAN: I have a lengthy  discussion with the patient and his wife today about his MRI as well as PET scan results. The final staging is consistent with limited stage small cell lung cancer.  I personally reviewed the PET scan as well as MRI. I recommended for the patient to proceed with systemic chemotherapy with carboplatin and etoposide as scheduled today. I will arrange for the patient to see radiation oncology for consideration of concurrent radiotherapy. For nausea the patient has Compazine 10 mg by mouth every 6 hours as needed. He would come back for followup visit in one week for evaluation and management any adverse effect of his chemotherapy. He was advised to call immediately if he has any concerning symptoms in the interval.  All questions were answered. The patient knows to call the clinic with any problems, questions or concerns. We can certainly see the patient much sooner if necessary.  I spent 15 minutes counseling the patient face to face. The total time spent in the appointment was 25 minutes.

## 2013-05-25 NOTE — Patient Instructions (Signed)
Continue chemotherapy today as scheduled. Followup visit in one week 

## 2013-05-25 NOTE — Telephone Encounter (Signed)
gv and printed appt sched and avs for pt....MW added tx   °

## 2013-05-25 NOTE — Patient Instructions (Signed)
[facility] Discharge Instructions for Patients Receiving Chemotherapy  Today you received the following chemotherapy agents VP-16 (Etoposide) and Carboplatin.  To help prevent nausea and vomiting after your treatment, we encourage you to take your nausea medication.   If you develop nausea and vomiting that is not controlled by your nausea medication, call the clinic.   BELOW ARE SYMPTOMS THAT SHOULD BE REPORTED IMMEDIATELY:  *FEVER GREATER THAN 100.5 F  *CHILLS WITH OR WITHOUT FEVER  NAUSEA AND VOMITING THAT IS NOT CONTROLLED WITH YOUR NAUSEA MEDICATION  *UNUSUAL SHORTNESS OF BREATH  *UNUSUAL BRUISING OR BLEEDING  TENDERNESS IN MOUTH AND THROAT WITH OR WITHOUT PRESENCE OF ULCERS  *URINARY PROBLEMS  *BOWEL PROBLEMS  UNUSUAL RASH Items with * indicate a potential emergency and should be followed up as soon as possible.  Feel free to call the clinic you have any questions or concerns. The clinic phone number is [phone].

## 2013-05-26 ENCOUNTER — Telehealth: Payer: Self-pay | Admitting: Internal Medicine

## 2013-05-26 ENCOUNTER — Ambulatory Visit (HOSPITAL_BASED_OUTPATIENT_CLINIC_OR_DEPARTMENT_OTHER): Payer: Medicare Other

## 2013-05-26 DIAGNOSIS — C778 Secondary and unspecified malignant neoplasm of lymph nodes of multiple regions: Secondary | ICD-10-CM

## 2013-05-26 DIAGNOSIS — Z5111 Encounter for antineoplastic chemotherapy: Secondary | ICD-10-CM

## 2013-05-26 DIAGNOSIS — C3410 Malignant neoplasm of upper lobe, unspecified bronchus or lung: Secondary | ICD-10-CM

## 2013-05-26 MED ORDER — ONDANSETRON 8 MG/50ML IVPB (CHCC)
8.0000 mg | Freq: Once | INTRAVENOUS | Status: AC
  Administered 2013-05-26: 8 mg via INTRAVENOUS

## 2013-05-26 MED ORDER — DEXAMETHASONE SODIUM PHOSPHATE 10 MG/ML IJ SOLN
10.0000 mg | Freq: Once | INTRAMUSCULAR | Status: AC
  Administered 2013-05-26: 10 mg via INTRAVENOUS

## 2013-05-26 MED ORDER — SODIUM CHLORIDE 0.9 % IV SOLN
120.0000 mg/m2 | Freq: Once | INTRAVENOUS | Status: AC
  Administered 2013-05-26: 230 mg via INTRAVENOUS
  Filled 2013-05-26: qty 11.5

## 2013-05-26 MED ORDER — SODIUM CHLORIDE 0.9 % IV SOLN
Freq: Once | INTRAVENOUS | Status: AC
  Administered 2013-05-26: 15:00:00 via INTRAVENOUS

## 2013-05-26 NOTE — Telephone Encounter (Signed)
appt sched with Dr. Mitzi Hansen on 6.18.14 @ 10:30am

## 2013-05-26 NOTE — Patient Instructions (Signed)
[facility] Discharge Instructions for Patients Receiving Chemotherapy  Today you received the following chemotherapy agents etoposide  To help prevent nausea and vomiting after your treatment, we encourage you to take your nausea medication as needed   If you develop nausea and vomiting that is not controlled by your nausea medication, call the clinic.   BELOW ARE SYMPTOMS THAT SHOULD BE REPORTED IMMEDIATELY:  *FEVER GREATER THAN 100.5 F  *CHILLS WITH OR WITHOUT FEVER  NAUSEA AND VOMITING THAT IS NOT CONTROLLED WITH YOUR NAUSEA MEDICATION  *UNUSUAL SHORTNESS OF BREATH  *UNUSUAL BRUISING OR BLEEDING  TENDERNESS IN MOUTH AND THROAT WITH OR WITHOUT PRESENCE OF ULCERS  *URINARY PROBLEMS  *BOWEL PROBLEMS  UNUSUAL RASH Items with * indicate a potential emergency and should be followed up as soon as possible.  Feel free to call the clinic you have any questions or concerns. The clinic phone number is [phone].    

## 2013-05-26 NOTE — Telephone Encounter (Signed)
s.w. pt and advised on Dr. Mitzi Hansen appt on 6.18.14 @ 10:30

## 2013-05-27 ENCOUNTER — Ambulatory Visit (HOSPITAL_BASED_OUTPATIENT_CLINIC_OR_DEPARTMENT_OTHER): Payer: Medicare Other

## 2013-05-27 DIAGNOSIS — Z5111 Encounter for antineoplastic chemotherapy: Secondary | ICD-10-CM

## 2013-05-27 DIAGNOSIS — C770 Secondary and unspecified malignant neoplasm of lymph nodes of head, face and neck: Secondary | ICD-10-CM

## 2013-05-27 DIAGNOSIS — C3490 Malignant neoplasm of unspecified part of unspecified bronchus or lung: Secondary | ICD-10-CM

## 2013-05-27 MED ORDER — SODIUM CHLORIDE 0.9 % IV SOLN
120.0000 mg/m2 | Freq: Once | INTRAVENOUS | Status: AC
  Administered 2013-05-27: 230 mg via INTRAVENOUS
  Filled 2013-05-27: qty 11.5

## 2013-05-27 MED ORDER — DEXAMETHASONE SODIUM PHOSPHATE 10 MG/ML IJ SOLN
10.0000 mg | Freq: Once | INTRAMUSCULAR | Status: AC
  Administered 2013-05-27: 10 mg via INTRAVENOUS

## 2013-05-27 MED ORDER — ONDANSETRON 8 MG/50ML IVPB (CHCC)
8.0000 mg | Freq: Once | INTRAVENOUS | Status: AC
  Administered 2013-05-27: 8 mg via INTRAVENOUS

## 2013-05-28 ENCOUNTER — Ambulatory Visit (HOSPITAL_BASED_OUTPATIENT_CLINIC_OR_DEPARTMENT_OTHER): Payer: Medicare Other

## 2013-05-28 ENCOUNTER — Telehealth: Payer: Self-pay | Admitting: *Deleted

## 2013-05-28 ENCOUNTER — Telehealth: Payer: Self-pay | Admitting: Dietician

## 2013-05-28 DIAGNOSIS — C3410 Malignant neoplasm of upper lobe, unspecified bronchus or lung: Secondary | ICD-10-CM

## 2013-05-28 DIAGNOSIS — Z5189 Encounter for other specified aftercare: Secondary | ICD-10-CM

## 2013-05-28 MED ORDER — PEGFILGRASTIM INJECTION 6 MG/0.6ML
6.0000 mg | Freq: Once | SUBCUTANEOUS | Status: AC
  Administered 2013-05-28: 6 mg via SUBCUTANEOUS
  Filled 2013-05-28: qty 0.6

## 2013-05-28 NOTE — Telephone Encounter (Signed)
Patient in for injection today, no problems from chemo, all questions answered.

## 2013-05-28 NOTE — Telephone Encounter (Signed)
Brief Outpatient Oncology Nutrition Note  Patient has been identified to be at risk on malnutrition screen.  Wt Readings from Last 10 Encounters:  05/25/13 160 lb 11.2 oz (72.893 kg)  05/18/13 158 lb 8 oz (71.895 kg)  04/27/13 160 lb 12.8 oz (72.938 kg)  06/16/12 170 lb (77.111 kg)  05/14/11 175 lb 6.4 oz (79.561 kg)  04/18/10 182 lb (82.555 kg)  08/18/09 188 lb (85.276 kg)  08/26/08 179 lb 6.1 oz (81.367 kg)    Caled patient regarding weight loss.  Patient reported losing approximately 10 lbs in the last 2 months.  Stated that he feels he has been eating well but has not been eating as much.  Had a hx of obesity and decreased weight after retirement and focus has been on that.  Discussed importance of weight maintenance and good nutrition throughout treatment, increasing intake to prevent further weight loss.  Patient reports that he has had no side effects of chemo treatment thus far.  Discussed use of protein shakes as needed if unable to maintain weight with meals.  Oran Rein, RD

## 2013-05-28 NOTE — Telephone Encounter (Signed)
Message copied by Kathlynn Grate on Fri May 28, 2013  4:21 PM ------      Message from: Carola Rhine A      Created: Tue May 25, 2013  6:13 PM      Regarding: Chemo follow up call      Contact: [phone]       1st Etoposide/Carboplatin ------

## 2013-05-28 NOTE — Patient Instructions (Addendum)

## 2013-05-31 ENCOUNTER — Encounter: Payer: Self-pay | Admitting: Oncology

## 2013-05-31 NOTE — Progress Notes (Addendum)
Thoracic Location of Tumor / Histology:  Small cell carcinoma, R upper lung, right-sided lymphadenopathy  Patient presented on 04/27/2013 with symptoms of: swelling knot/ in right mid neck for 1 week.  Biopsies of right supraclavicular lymph node revealed: small cell carcinoma  Tobacco/Marijuana/Snuff/ETOH use: 1 ppd history of smoking for 60 years.  Past/Anticipated interventions by cardiothoracic surgery, if any: none  Past/Anticipated interventions by medical oncology, if any: Systemic chemotherapy with carboplatin for AUC of 5 on day 1 and etoposide 120 mg/M2 on days 1, 2 and 3 with Neulasta support on day 4 every 3 weeks.    Signs/Symptoms  Weight changes, if any: lost 10 lbs in last month  Respiratory complaints, if any: denies shortness of breath and cough  Hemoptysis, if any: no  Pain issues, if any:  Slight headaches occasionally  SAFETY ISSUES:  Prior radiation? No  Pacemaker/ICD? No  Possible current pregnancy? No  Is the patient on methotrexate? No  Current Complaints / other details:  Cory [name] here with his wife and son.  He had chemotherapy last Thursday.  Today he feels very lethargic and fatigued.  He has questions about radiation side effects.  He was given the Radiation Therapy and You book to read.  He is also having nausea from chemotherapy that is effecting his appetite.  He has lost 10 lbs in the last month.

## 2013-06-01 ENCOUNTER — Encounter: Payer: Self-pay | Admitting: Physician Assistant

## 2013-06-01 ENCOUNTER — Telehealth: Payer: Self-pay | Admitting: *Deleted

## 2013-06-01 ENCOUNTER — Other Ambulatory Visit: Payer: Medicare Other

## 2013-06-01 ENCOUNTER — Ambulatory Visit (HOSPITAL_BASED_OUTPATIENT_CLINIC_OR_DEPARTMENT_OTHER): Payer: Medicare Other | Admitting: Physician Assistant

## 2013-06-01 ENCOUNTER — Other Ambulatory Visit (HOSPITAL_BASED_OUTPATIENT_CLINIC_OR_DEPARTMENT_OTHER): Payer: Medicare Other | Admitting: Lab

## 2013-06-01 ENCOUNTER — Telehealth: Payer: Self-pay | Admitting: Internal Medicine

## 2013-06-01 DIAGNOSIS — C3490 Malignant neoplasm of unspecified part of unspecified bronchus or lung: Secondary | ICD-10-CM

## 2013-06-01 LAB — CBC WITH DIFFERENTIAL/PLATELET
BASO%: 1.1 % (ref 0.0–2.0)
Eosinophils Absolute: 0.1 10*3/uL (ref 0.0–0.5)
MCHC: 35.7 g/dL (ref 32.0–36.0)
MONO#: 0 10*3/uL — ABNORMAL LOW (ref 0.1–0.9)
NEUT#: 0.7 10*3/uL — ABNORMAL LOW (ref 1.5–6.5)
RBC: 3.92 10*6/uL — ABNORMAL LOW (ref 4.20–5.82)
RDW: 12.8 % (ref 11.0–14.6)
WBC: 1.9 10*3/uL — ABNORMAL LOW (ref 4.0–10.3)
lymph#: 1.1 10*3/uL (ref 0.9–3.3)

## 2013-06-01 LAB — COMPREHENSIVE METABOLIC PANEL (CC13)
ALT: 23 U/L (ref 0–55)
Albumin: 3.3 g/dL — ABNORMAL LOW (ref 3.5–5.0)
CO2: 26 mEq/L (ref 22–29)
Chloride: 105 mEq/L (ref 98–107)
Potassium: 3.6 mEq/L (ref 3.5–5.1)
Sodium: 140 mEq/L (ref 136–145)
Total Bilirubin: 3.22 mg/dL — ABNORMAL HIGH (ref 0.20–1.20)
Total Protein: 5.9 g/dL — ABNORMAL LOW (ref 6.4–8.3)

## 2013-06-01 NOTE — Patient Instructions (Addendum)
Continue weekly labs as scheduled Follow up with Dr. Arbutus Ped in 2 weeks, prior to your next scheduled cycle of chemotherapy

## 2013-06-01 NOTE — Telephone Encounter (Signed)
Per staff phone call and POF I have schedueld appts.  JMW  

## 2013-06-01 NOTE — Progress Notes (Addendum)
[facility] [phone]   [phone]  SHARED VISIT PROGRESS NOTE  Cory Mcalpine, MD [address] [place] [address]  DIAGNOSIS: Limited stage small cell lung cancer diagnosed in May of 2014.  PRIOR THERAPY: None.  CURRENT THERAPY: Systemic chemotherapy with carboplatin for AUC of 5 on day 1 and etoposide 120 mg/M2 on days 1, 2 and 3 with Neulasta support on day 4 every 3 weeks. Status post 1 cycle  INTERVAL HISTORY: Cory [name] 79 y.o. male returns to the clinic today for followup visit accompanied his wife. He presents for symptom management visit after receiving his first cycle of systemic chemotherapy with carboplatin and etoposide with Neulasta support. He reports that he did not take the Claritin as advised and was reluctant to take his Compazine. He is not had distinct nausea but has felt tired/exhausted. He states that he "feels weak as a kid and". His appetite is off, eating primarily canned peaches and pairs and chicken noodle soup. He did venture into eating a frozen meal with reasonable success. He is drinking half of an ensure-type supplement and has not been very successful getting in his 64 ounces of water daily. He reports his sleep cycle is off. He is sleeping for approximately 2 hours and then is awake for an hour and the cycle repeats. He was able to sleep for about 4 hours earlier this morning. He he feels on focused. He feels frustrated that he is so tired and weak that he is unable to play tennis.  The patient denied having any significant chest pain, shortness breath, cough or hemoptysis. He denied having any significant weight loss or night sweats.   MEDICAL HISTORY: Past Medical History  Diagnosis Date  . OSA (obstructive sleep apnea)   . COPD (chronic obstructive pulmonary disease)   . Cigarette smoker   . Hypertension   . Venous insufficiency   . Hypercholesteremia   . Borderline diabetes mellitus   . Diverticulosis  of colon   . Hx of colonic polyps   . BPH (benign prostatic hyperplasia)   . DJD (degenerative joint disease)   . Lumbar back pain   . Leg cramps   . History of skin cancer   . History of shingles   . History of chemotherapy 05/25/2013    carboplatin, etoposide, neulasta every 3 weeks     ALLERGIES:  is allergic to niacin.  MEDICATIONS:  Current Outpatient Prescriptions  Medication Sig Dispense Refill  . amLODipine (NORVASC) 5 MG tablet TAKE ONE TABLET BY MOUTH ONE TIME DAILY  90 tablet  0  . aspirin 81 MG tablet Take 81 mg by mouth daily.        . Cholecalciferol (VITAMIN D) 2000 UNITS CAPS Take 1 capsule by mouth daily.        Marland Kitchen latanoprost (XALATAN) 0.005 % ophthalmic solution Place 1 drop into the left eye at bedtime.        . Multiple Vitamin (MULTIVITAMIN) capsule Take 1 capsule by mouth daily.        . potassium chloride SA (KLOR-CON M20) 20 MEQ tablet Take 1 tablet (20 mEq total) by mouth 2 (two) times daily.  180 tablet  3  . prochlorperazine (COMPAZINE) 10 MG tablet Take 1 tablet (10 mg total) by mouth every 6 (six) hours as needed.  60 tablet  0  . simvastatin (ZOCOR) 40 MG tablet Take 1/2 tablet by mouth daily      .  Trolamine Salicylate (MYOFLEX EX) Apply 1 application topically 2 (two) times daily as needed. For pain  AS NEEDED      . vitamin C (ASCORBIC ACID) 500 MG tablet Take 500 mg by mouth daily.         No current facility-administered medications for this visit.    SURGICAL HISTORY:  Past Surgical History  Procedure Laterality Date  . Appendectomy    . Tonsillectomy and adenoidectomy    . Left inguinal hernia repair  1998    Dr. Earlene Plater  . Lumbar laminectomy  1997    Dr. Roxan Hockey  . Lymph node biopsy  05/06/2013    REVIEW OF SYSTEMS:  A comprehensive review of systems was negative except for: Constitutional: positive for anorexia and fatigue   PHYSICAL EXAMINATION: General appearance: alert, cooperative, fatigued and no distress Head: Normocephalic,  without obvious abnormality, atraumatic Neck: no adenopathy Oropharynx: No evidence of thrush or mucositis no ulcerations Lymph nodes: Palpable right supraclavicular lymphadenopathy measuring around 3 CM in size. Resp: clear to auscultation bilaterally Back: symmetric, no curvature. ROM normal. No CVA tenderness. Cardio: regular rate and rhythm, S1, S2 normal, no murmur, click, rub or gallop GI: soft, non-tender; bowel sounds normal; no masses,  no organomegaly Extremities: extremities normal, atraumatic, no cyanosis or edema Neurologic: Alert and oriented X 3, normal strength and tone. Normal symmetric reflexes. Normal coordination and gait  ECOG PERFORMANCE STATUS: 1 - Symptomatic but completely ambulatory  Blood pressure 139/75, pulse 82, temperature 97.2 F (36.2 C), temperature source Oral, resp. rate 20, height 5\' 9"  (1.753 m), weight 160 lb 14.4 oz (72.984 kg).  LABORATORY DATA: Lab Results  Component Value Date   WBC 1.9* 06/01/2013   HGB 12.4* 06/01/2013   HCT 34.7* 06/01/2013   MCV 88.5 06/01/2013   PLT 79* 06/01/2013      Chemistry      Component Value Date/Time   NA 140 06/01/2013 1412   NA 140 04/27/2013 1631   K 3.6 06/01/2013 1412   K 3.5 04/27/2013 1631   CL 105 06/01/2013 1412   CL 102 04/27/2013 1631   CO2 26 06/01/2013 1412   CO2 32 04/27/2013 1631   BUN 19.7 06/01/2013 1412   BUN 14 04/27/2013 1631   CREATININE 0.7 06/01/2013 1412   CREATININE 1.0 04/27/2013 1631      Component Value Date/Time   CALCIUM 8.5 06/01/2013 1412   CALCIUM 9.0 04/27/2013 1631   ALKPHOS 63 06/01/2013 1412   ALKPHOS 42 06/22/2012 0758   AST 17 06/01/2013 1412   AST 24 06/22/2012 0758   ALT 23 06/01/2013 1412   ALT 21 06/22/2012 0758   BILITOT 3.22* 06/01/2013 1412   BILITOT 2.0* 06/22/2012 0758       RADIOGRAPHIC STUDIES: Dg Eye Foreign Body  05/20/2013   *RADIOLOGY REPORT*  Clinical Data: 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  Findings: Views of the orbits were obtained with the patient looking to the left and looking to the right.  No orbital metallic foreign body is seen.  No bony abnormality is noted.  The paranasal sinuses that are visualized are clear.  Multiple dental implants are noted.  IMPRESSION: No orbital metallic foreign body.   Original Report Authenticated By: Dwyane Dee, M.D.   Ct Soft Tissue Neck W Contrast  04/30/2013   *RADIOLOGY REPORT*  Clinical Data: Nontender neck mass.  60-year history of  smoking.  CT NECK WITH CONTRAST  Technique:  Multidetector CT imaging of the neck was performed with intravenous contrast.  Contrast: 75mL OMNIPAQUE IOHEXOL 300 MG/ML  SOLN  Comparison: Chest x-ray 06/16/2012.  Findings: Suprahyoid neck:  Major and minor salivary glands are unremarkable.  Specifically, no submandibular gland enlargement on the right.  No evidence for periodontal disease or floor of mouth abscess.  Within limits of detection on CT, no visible mucosal lesion.  Normal appearing nasopharynx, oro- and hypopharynx, and parapharyngeal regions.  Larynx:  Normal larynx as well as supraglottic and subglottic regions.  Infrahyoid neck:  13 x 14 mm right thyroid hypoattenuating lesion; consider tissue sampling for further evaluation.  Lymph nodes:  There is pathologic lymphadenopathy on the right. Right Level IIA node measures 25 x 28 mm.  Level IV node on the right displaces the internal jugular vein anteriorly, and measures 20 x 21 mm.  Level VB adenopathy, with a dominant 21 x 27 lymph node.  Upper chest/mediastinum:  There is bulky right paratracheal adenopathy which is displacing the IVC anteriorly. Concern raised for squamous cell carcinoma.  Cross-sectional measurements are 34 x 64 mm on image 77 which is the most inferior extent of the CT neck. Plain film and CT chest recommended for further evaluation to exclude impending SVC syndrome.  Clear lung apices.  Nonstenotic atheromatous change  proximal great vessels.  Additional:  Minor atheromatous change at the carotid bifurcations. Cervical spondylosis with disc space narrowing most notable C5-C6. Visualized intracranial compartment unremarkable.  IMPRESSION: Pathologic right-sided lymphadenopathy likely reflects metastatic squamous cell carcinoma of the lung.  Bulky right paratracheal mass is incompletely evaluated, but displaces the SVC anteriorly.  See discussion above.  Findings discussed with ordering provider.   Original Report Authenticated By: Davonna Belling, M.D.   Ct Chest W Contrast  05/03/2013   *RADIOLOGY REPORT*  Clinical Data: Evaluate mediastinal lymphadenopathy noted on recent neck CT.  CT CHEST WITH CONTRAST  Technique:  Multidetector CT imaging of the chest was performed following the standard protocol during bolus administration of intravenous contrast.  Contrast: 80mL OMNIPAQUE IOHEXOL 300 MG/ML  SOLN  Comparison: CT of the neck with contrast 04/30/2013.  Findings:  Mediastinum: Low right cervical and right supraclavicular lymphadenopathy is noted, measuring up to 1.7 x 3.0 cm (image 6 of series 2).  There is also extensive mediastinal lymphadenopathy, with the largest nodal mass in the low right paratracheal station measuring approximately 4.1 x 6.0 cm.  Enlarged lymph nodes are present in the subcarinal, right hilar, high right paratracheal and superior mediastinal nodal stations. The largest of these right paratracheal lymph nodes severely displace and narrow the superior vena cava, however, the superior vena cava remains patent at this time.  Heart size is normal. There is no significant pericardial fluid, thickening or pericardial calcification. There is atherosclerosis of the thoracic aorta, the great vessels of the mediastinum and the coronary arteries, including calcified atherosclerotic plaque in the left anterior descending, left circumflex and right coronary arteries. Calcifications of the aortic valve.  Esophagus is  unremarkable in appearance.  Lungs/Pleura: There is a focal area of mild architectural distortion and patchy nodularity in the right upper lobe, with the largest single nodule in this region measuring approximately 1 cm (image 20 of series 3).  Several other smaller nodules are also seen scattered throughout the right lung.  No contralateral lung nodules are noted.  No pleural effusions.  Upper Abdomen: Calcifications in the renal hila bilaterally are favored to be vascular,  but may alternatively represent nonobstructive calculi. In addition, there is an exophytic intermediate attenuation (25 HU) lesion extending from the upper pole of the right kidney.  Musculoskeletal: There are no aggressive appearing lytic or blastic lesions noted in the visualized portions of the skeleton.  IMPRESSION: 1.  Extensive mediastinal, right hilar, right cervical and right supraclavicular lymphadenopathy, as above.  Findings are highly concerning for malignancy, and differential considerations would include lymphoma and primary lung neoplasm such as a small cell carcinoma.  Biopsy is recommended to establish a tissue diagnosis. 2.  Area of mild architectural distortion of patchy nodularity in the right upper lobe is nonspecific but favored to be of infectious or inflammatory etiology, potentially related to intermittent obstruction of bronchi leading to this region.  Attention on follow- up studies is recommended to ensure the stability or resolution of these nodules. 3.  Intermediate attenuation lesion extending exophytically from the upper pole of the right kidney is favored to represent a minimally complex (likely proteinaceous) cyst.   Original Report Authenticated By: Trudie Reed, M.D.   Mr Laqueta Jean YQ Contrast  05/21/2013   *RADIOLOGY REPORT*  Clinical Data: Lung cancer.  Staging. History of hypertension. History of tobacco abuse.  History of COPD.  MRI HEAD WITHOUT AND WITH CONTRAST  Technique:  Multiplanar, multiecho  pulse sequences of the brain and surrounding structures were obtained according to standard protocol without and with intravenous contrast  Contrast: 15mL MULTIHANCE GADOBENATE DIMEGLUMINE 529 MG/ML IV SOLN  Comparison: None.  Findings: No acute stroke or hemorrhage.  No mass lesion or hydrocephalus.  Moderate atrophy.  Extensive chronic microvascular ischemic change. Widespread prominence perivascular spaces suggest longstanding hypertension.  Widely patent internal carotid arteries, basilar artery, and vertebral arteries.  No osseous lesions.  Pituitary and cerebellar tonsils unremarkable.  No cervical canal stenosis.  Post infusion imaging does not reveal clear-cut intracranial metastatic disease.  There are small foci of enhancement which can be seen on occasional sagittal images which cannot be correlated with similar foci of axial or coronal imaging, and are felt to represent  end on vessels.  Incompletely evaluated right cervical adenopathy.  No skull base or calvarial lesions.  Major dural venous sinuses appear patent.  No meningeal enhancement. No acute sinus disease.  Negative mastoids.  IMPRESSION: Moderate atrophy.  Extensive chronic microvascular ischemic change. No intracranial metastatic disease is present.   Original Report Authenticated By: Davonna Belling, M.D.   Nm Pet Image Initial (pi) Skull Base To Thigh  05/24/2013   *RADIOLOGY REPORT*  Clinical Data: Initial treatment strategy for lung cancer.  NUCLEAR MEDICINE PET SKULL BASE TO THIGH  Fasting Blood Glucose:  114  Technique:  18.4 mCi F-18 FDG was injected intravenously. CT data was obtained and used for attenuation correction and anatomic localization only.  (This was not acquired as a diagnostic CT examination.) Additional exam technical data entered on technologist worksheet.  Comparison:  Chest CT 05/03/2013  Findings:  Neck: There is a 2.5 cm level three lymph node which is present on image 39 which does not have significant metabolic  activity.  More inferiorly there is a large level IV lymph node adjacent to thyroid gland measuring 18 mm ( image 62) with intense metabolic activity ( SUV max = 65.7).  Additional hypermetabolic high right paratracheal lymph nodes and supraclavicular lymph node on the right.  There is hypermetabolic nodule within the left lobe of the thyroid gland.  Chest:  There is a extremely large right lower paratracheal lymph node  measuring 4.8 cm with intense metabolic activity ( SUV max = 24).  This lymph nodes extend slightly left of the trachea (image 91) qualify as contralateral disease.  Within the right upper lobe there is a cluster of hypermetabolic nodules (image 87) with SUV max = 7.1.  The largest measures 12 mm on image 88.  Abdomen/Pelvis:  No abnormal hypermetabolic activity within the liver, pancreas, adrenal glands, or spleen.  No hypermetabolic lymph nodes in the abdomen or pelvis.  Skeleton:  No focal hypermetabolic activity to suggest skeletal metastasis.  IMPRESSION:  1.  Bulky hypermetabolic right paratracheal and supraclavicular adenopathy consistent with metastatic adenopathy.  Consider small cell lung cancer. 2. Right paratracheal adenopathy extends just left of the trachea consistent with contralateral disease.  4.  Hypermetabolic nodule within the central right upper lobe may represent primary lesion. 5.  Hypermetabolic nodule within the left lobe of thyroid gland is indeterminate. 5.  Staging by FDG PET CT imaging T1 N3 M0   Original Report Authenticated By: Genevive Bi, M.D.   US Biopsy  05/06/2013   *RADIOLOGY REPORT*  Clinical data:  Mediastinal, right hilar, right supraclavicular, and cervical adenopathy  ULTRASOUND-GUIDED CORE BIOPSY RIGHT SUPRACLAVICULAR ADENOPATHY  Comparison:  CT 05/03/2013  Technique and findings: The procedure, risks (including but not limited to bleeding, infection, organ damage), benefits, and alternatives were explained to the patient.  Questions regarding the  procedure were encouraged and answered.  The patient understands and consents to the procedure.Survey ultrasound of the right neck was performed in the supraclavicular adenopathy was identified. Operator donned sterile gloves and mask.   Site was marked, prepped with Betadine, draped in usual sterile fashion, infiltrated locally with 1% lidocaine.  Under real time ultrasound guidance, a 17 gauge trocar needle was advanced to the margin of the lesion.  Once needle tip position was confirmed, coaxial 18- gauge core biopsy samples were obtained, submitted in saline to surgical pathology.  The guide needle was removed.  The patient tolerated the procedure well.  No immediate complication. Postprocedure scans show no hematoma.  IMPRESSION: Technically successful ultrasound-guided core biopsy of right supraclavicular adenopathy.   Original Report Authenticated By: D. Andria Rhein, MD    ASSESSMENT: This is a very pleasant 79 years old white male recently diagnosed with limited stage small cell lung cancer, with a right upper lobe lung mass as well as massive mediastinal and right supraclavicular lymphadenopathy diagnosed in May of 2014. He is currently being treated with systemic chemotherapy in the form of carboplatin for an AUC of 5 given on day 1 and etoposide 120 mg per meter squared given on days 1, 2 and 3 with Neulasta support given on day 4 every 3 weeks. Status post 1 cycle.   PLAN: Patient was discussed with and also seen by Dr. Arbutus Ped. Both patient and his wife had several questions about expected signs and symptoms, suits that he should eat or avoid. All questions were answered to their satisfaction. He will continue with weekly labs as scheduled. He will proceed with his consultation with Dr. Mitzi Hansen in radiation oncology as scheduled next week. He'll followup with Dr. Arbutus Ped in 2 weeks with repeat CBC differential and C. met prior to cycle #2 of his systemic chemotherapy with carboplatin and etoposide  with Neulasta support. The patient is encouraged to eat several small meals throughout the day in a "grazing manner". He is also encouraged to ingest to ensure-type supplements daily as well as push by mouth fluids. He is cautioned to  seek emergency room evaluation should he develop fever or chills. Both patient and his wife voiced understanding.Marland Kitchen   Biannca Scantlin E, PA-C   He was advised to call immediately if he has any concerning symptoms in the interval.  All questions were answered. The patient knows to call the clinic with any problems, questions or concerns. We can certainly see the patient much sooner if necessary.  I spent 25 minutes counseling the patient face to face. The total time spent in the appointment was 35 minutes.  ADDENDUM: Hematology/oncology attending: I have face to face encounter with the patient and discussed the care plan was Ms. Wohl. He is a very pleasant 79 years old white male who recently diagnosed with small cell lung cancer. The patient started the first cycle of systemic chemotherapy was carboplatin and etoposide last week. He tolerated his treatment fairly well except for increasing fatigue and lack of appetite. He denied having any significant nausea or vomiting. He has no fever or chills. His total white blood count, absolute neutrophil count and platelet count are declining secondary to recent chemotherapy. I gave the patient neutropenic precautions. He was advised to call immediately if he has any fever or chills or any bleeding issues. I would see him back for followup visit in 2 weeks for evaluation before starting cycle #2 of his chemotherapy. Lajuana Matte., MD 06/01/2013

## 2013-06-02 ENCOUNTER — Encounter: Payer: Self-pay | Admitting: Radiation Oncology

## 2013-06-02 ENCOUNTER — Ambulatory Visit
Admission: RE | Admit: 2013-06-02 | Discharge: 2013-06-02 | Disposition: A | Discharge status: Home / Self Care | Payer: Medicare Other | Source: Ambulatory Visit | Attending: Radiation Oncology | Admitting: Radiation Oncology

## 2013-06-02 VITALS — BP 119/70 | HR 83 | Temp 98.0°F | Ht 69.0 in | Wt 158.5 lb

## 2013-06-02 DIAGNOSIS — M1990 Unspecified osteoarthritis, unspecified site: Secondary | ICD-10-CM | POA: Insufficient documentation

## 2013-06-02 DIAGNOSIS — K5730 Diverticulosis of large intestine without perforation or abscess without bleeding: Secondary | ICD-10-CM | POA: Insufficient documentation

## 2013-06-02 DIAGNOSIS — F17200 Nicotine dependence, unspecified, uncomplicated: Secondary | ICD-10-CM | POA: Insufficient documentation

## 2013-06-02 DIAGNOSIS — I10 Essential (primary) hypertension: Secondary | ICD-10-CM | POA: Insufficient documentation

## 2013-06-02 DIAGNOSIS — Z9089 Acquired absence of other organs: Secondary | ICD-10-CM | POA: Insufficient documentation

## 2013-06-02 DIAGNOSIS — Z79899 Other long term (current) drug therapy: Secondary | ICD-10-CM | POA: Insufficient documentation

## 2013-06-02 DIAGNOSIS — E7800 Pure hypercholesterolemia, unspecified: Secondary | ICD-10-CM | POA: Insufficient documentation

## 2013-06-02 DIAGNOSIS — Z7982 Long term (current) use of aspirin: Secondary | ICD-10-CM | POA: Insufficient documentation

## 2013-06-02 DIAGNOSIS — R599 Enlarged lymph nodes, unspecified: Secondary | ICD-10-CM | POA: Insufficient documentation

## 2013-06-02 DIAGNOSIS — C342 Malignant neoplasm of middle lobe, bronchus or lung: Secondary | ICD-10-CM

## 2013-06-02 DIAGNOSIS — J4489 Other specified chronic obstructive pulmonary disease: Secondary | ICD-10-CM | POA: Insufficient documentation

## 2013-06-02 DIAGNOSIS — J449 Chronic obstructive pulmonary disease, unspecified: Secondary | ICD-10-CM | POA: Insufficient documentation

## 2013-06-02 DIAGNOSIS — C3490 Malignant neoplasm of unspecified part of unspecified bronchus or lung: Secondary | ICD-10-CM | POA: Insufficient documentation

## 2013-06-02 NOTE — Addendum Note (Signed)
Encounter addended by: Eduardo Osier, RN on: 06/02/2013  2:50 PM<BR>     Documentation filed: Charges VN

## 2013-06-02 NOTE — Progress Notes (Addendum)
Radiation Oncology         [phone] ________________________________  Name: Cory [name] MRN: [id number]  Date: 06/02/2013  DOB: [birth year]  WU:[id number],JYNWG Judie Petit, MD  Si Gaul, MD     REFERRING PHYSICIAN: Si Gaul, MD   DIAGNOSIS: Small cell lung cancer  HISTORY OF PRESENT ILLNESS::Cory [name] is a 79 y.o. male who is seen for an initial consultation visit. The patient was noted to have a node in the right neck which was noticed when he was seen for a sore tooth. The patient was started on antibiotics but the lymph node persisted.  The patient proceeded to undergo a CT scan of the neck. Right-sided lymphadenopathy was seen in addition to a bulky right paratracheal mass which was incompletely evaluated. This was felt to likely represent lung cancer with regional spread.  The patient then underwent a CT scan of the chest. Extensive mediastinal, right hilar, right cervical and right supraclavicular lymphadenopathy was present.  And ultrasound-guided core biopsy of the right sided supraclavicular lymphadenopathy was performed. The pathology returned positive for small cell lung cancer.  Additional staging studies have included an MRI scan of the brain. No intracranial metastatic disease was present. A PET scan was also performed. This showed hypermetabolic activity associated with bulky right paratracheal and supraclavicular adenopathy. There is also a hypermetabolic nodule within the central right upper lobe consistent with a possible primary lesion.  The patient has been diagnosed with limited stage small cell lung cancer with significant regional disease. He has begun chemotherapy. The patient states that this has gone well with the dominant complaint today of fatigue. He has also had some sleep disturbances. No worsening shortness of breath through any of this, and no pain in the chest or elsewhere.   PREVIOUS RADIATION THERAPY: No   PAST MEDICAL HISTORY:  has a past  medical history of OSA (obstructive sleep apnea); COPD (chronic obstructive pulmonary disease); Cigarette smoker; Hypertension; Venous insufficiency; Hypercholesteremia; Borderline diabetes mellitus; Diverticulosis of colon; colonic polyps; BPH (benign prostatic hyperplasia); DJD (degenerative joint disease); Lumbar back pain; Leg cramps; History of skin cancer; History of shingles; and History of chemotherapy (05/25/2013).     PAST SURGICAL HISTORY: Past Surgical History  Procedure Laterality Date  . Appendectomy    . Tonsillectomy and adenoidectomy    . Left inguinal hernia repair  1998    Dr. Earlene Plater  . Lumbar laminectomy  1997    Dr. Roxan Hockey  . Lymph node biopsy  05/06/2013     FAMILY HISTORY: family history includes Breast cancer in his sister; Cervical cancer in his mother; and Prostate cancer in his father.   SOCIAL HISTORY:  reports that he has been smoking Cigarettes.  He has a 50 pack-year smoking history. He has never used smokeless tobacco. He reports that he does not drink alcohol or use illicit drugs.   ALLERGIES: Niacin   MEDICATIONS:  Current Outpatient Prescriptions  Medication Sig Dispense Refill  . amLODipine (NORVASC) 5 MG tablet TAKE ONE TABLET BY MOUTH ONE TIME DAILY  90 tablet  0  . aspirin 81 MG tablet Take 81 mg by mouth daily.        . Cetirizine HCl (ZYRTEC ALLERGY) 10 MG CAPS Take 1 capsule by mouth.      . Cholecalciferol (VITAMIN D) 2000 UNITS CAPS Take 1 capsule by mouth daily.        Marland Kitchen latanoprost (XALATAN) 0.005 % ophthalmic solution Place 1 drop into the left eye at bedtime.        Marland Kitchen  Multiple Vitamin (MULTIVITAMIN) capsule Take 1 capsule by mouth daily.        . potassium chloride SA (KLOR-CON M20) 20 MEQ tablet Take 1 tablet (20 mEq total) by mouth 2 (two) times daily.  180 tablet  3  . prochlorperazine (COMPAZINE) 10 MG tablet Take 1 tablet (10 mg total) by mouth every 6 (six) hours as needed.  60 tablet  0  . simvastatin (ZOCOR) 40 MG tablet Take  1/2 tablet by mouth daily      . vitamin C (ASCORBIC ACID) 500 MG tablet Take 500 mg by mouth daily.        Terrance Mass Salicylate (MYOFLEX EX) Apply 1 application topically 2 (two) times daily as needed. For pain  AS NEEDED       No current facility-administered medications for this encounter.     REVIEW OF SYSTEMS:  A 15 point review of systems is documented in the electronic medical record. This was obtained by the nursing staff. However, I reviewed this with the patient to discuss relevant findings and make appropriate changes.  Pertinent items are noted in HPI.    PHYSICAL EXAM:  height is 5\' 9"  (1.753 m) and weight is 158 lb 8 oz (71.895 kg). His temperature is 98 F (36.7 C). His blood pressure is 119/70 and his pulse is 83. His oxygen saturation is 97%.   General: Well-developed, in no acute distress HEENT: Normocephalic, atraumatic; oral cavity clear; right-sided supraclavicular lymphadenopathy present just inferior to the mandible Neck: Supple without any lymphadenopathy Cardiovascular: Regular rate and rhythm Respiratory: Clear to auscultation bilaterally GI: Soft, nontender, normal bowel sounds Extremities: No edema present Neuro: No focal deficits     LABORATORY DATA:  Lab Results  Component Value Date   WBC 1.9* 06/01/2013   HGB 12.4* 06/01/2013   HCT 34.7* 06/01/2013   MCV 88.5 06/01/2013   PLT 79* 06/01/2013   Lab Results  Component Value Date   NA 140 06/01/2013   K 3.6 06/01/2013   CL 105 06/01/2013   CO2 26 06/01/2013   Lab Results  Component Value Date   ALT 23 06/01/2013   AST 17 06/01/2013   ALKPHOS 63 06/01/2013   BILITOT 3.22* 06/01/2013      RADIOGRAPHY: Dg Eye Foreign Body  05/20/2013   *RADIOLOGY REPORT*  Clinical Data: 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  Findings: Views of the orbits were obtained with the patient looking to the left and looking to the right.  No  orbital metallic foreign body is seen.  No bony abnormality is noted.  The paranasal sinuses that are visualized are clear.  Multiple dental implants are noted.  IMPRESSION: No orbital metallic foreign body.   Original Report Authenticated By: Dwyane Dee, M.D.   Ct Chest W Contrast  05/03/2013   *RADIOLOGY REPORT*  Clinical Data: Evaluate mediastinal lymphadenopathy noted on recent neck CT.  CT CHEST WITH CONTRAST  Technique:  Multidetector CT imaging of the chest was performed following the standard protocol during bolus administration of intravenous contrast.  Contrast: 80mL OMNIPAQUE IOHEXOL 300 MG/ML  SOLN  Comparison: CT of the neck with contrast 04/30/2013.  Findings:  Mediastinum: Low right cervical and right supraclavicular lymphadenopathy is noted, measuring up to 1.7 x 3.0 cm (image 6 of series 2).  There is also extensive mediastinal lymphadenopathy, with the largest nodal mass in the low right paratracheal station  measuring approximately 4.1 x 6.0 cm.  Enlarged lymph nodes are present in the subcarinal, right hilar, high right paratracheal and superior mediastinal nodal stations. The largest of these right paratracheal lymph nodes severely displace and narrow the superior vena cava, however, the superior vena cava remains patent at this time.  Heart size is normal. There is no significant pericardial fluid, thickening or pericardial calcification. There is atherosclerosis of the thoracic aorta, the great vessels of the mediastinum and the coronary arteries, including calcified atherosclerotic plaque in the left anterior descending, left circumflex and right coronary arteries. Calcifications of the aortic valve.  Esophagus is unremarkable in appearance.  Lungs/Pleura: There is a focal area of mild architectural distortion and patchy nodularity in the right upper lobe, with the largest single nodule in this region measuring approximately 1 cm (image 20 of series 3).  Several other smaller nodules are  also seen scattered throughout the right lung.  No contralateral lung nodules are noted.  No pleural effusions.  Upper Abdomen: Calcifications in the renal hila bilaterally are favored to be vascular, but may alternatively represent nonobstructive calculi. In addition, there is an exophytic intermediate attenuation (25 HU) lesion extending from the upper pole of the right kidney.  Musculoskeletal: There are no aggressive appearing lytic or blastic lesions noted in the visualized portions of the skeleton.  IMPRESSION: 1.  Extensive mediastinal, right hilar, right cervical and right supraclavicular lymphadenopathy, as above.  Findings are highly concerning for malignancy, and differential considerations would include lymphoma and primary lung neoplasm such as a small cell carcinoma.  Biopsy is recommended to establish a tissue diagnosis. 2.  Area of mild architectural distortion of patchy nodularity in the right upper lobe is nonspecific but favored to be of infectious or inflammatory etiology, potentially related to intermittent obstruction of bronchi leading to this region.  Attention on follow- up studies is recommended to ensure the stability or resolution of these nodules. 3.  Intermediate attenuation lesion extending exophytically from the upper pole of the right kidney is favored to represent a minimally complex (likely proteinaceous) cyst.   Original Report Authenticated By: Trudie Reed, M.D.   Mr Laqueta Jean ZO Contrast  05/21/2013   *RADIOLOGY REPORT*  Clinical Data: Lung cancer.  Staging. History of hypertension. History of tobacco abuse.  History of COPD.  MRI HEAD WITHOUT AND WITH CONTRAST  Technique:  Multiplanar, multiecho pulse sequences of the brain and surrounding structures were obtained according to standard protocol without and with intravenous contrast  Contrast: 15mL MULTIHANCE GADOBENATE DIMEGLUMINE 529 MG/ML IV SOLN  Comparison: None.  Findings: No acute stroke or hemorrhage.  No mass lesion  or hydrocephalus.  Moderate atrophy.  Extensive chronic microvascular ischemic change. Widespread prominence perivascular spaces suggest longstanding hypertension.  Widely patent internal carotid arteries, basilar artery, and vertebral arteries.  No osseous lesions.  Pituitary and cerebellar tonsils unremarkable.  No cervical canal stenosis.  Post infusion imaging does not reveal clear-cut intracranial metastatic disease.  There are small foci of enhancement which can be seen on occasional sagittal images which cannot be correlated with similar foci of axial or coronal imaging, and are felt to represent  end on vessels.  Incompletely evaluated right cervical adenopathy.  No skull base or calvarial lesions.  Major dural venous sinuses appear patent.  No meningeal enhancement. No acute sinus disease.  Negative mastoids.  IMPRESSION: Moderate atrophy.  Extensive chronic microvascular ischemic change. No intracranial metastatic disease is present.   Original Report Authenticated By: Davonna Belling, M.D.  Nm Pet Image Initial (pi) Skull Base To Thigh  05/24/2013   *RADIOLOGY REPORT*  Clinical Data: Initial treatment strategy for lung cancer.  NUCLEAR MEDICINE PET SKULL BASE TO THIGH  Fasting Blood Glucose:  114  Technique:  18.4 mCi F-18 FDG was injected intravenously. CT data was obtained and used for attenuation correction and anatomic localization only.  (This was not acquired as a diagnostic CT examination.) Additional exam technical data entered on technologist worksheet.  Comparison:  Chest CT 05/03/2013  Findings:  Neck: There is a 2.5 cm level three lymph node which is present on image 39 which does not have significant metabolic activity.  More inferiorly there is a large level IV lymph node adjacent to thyroid gland measuring 18 mm ( image 62) with intense metabolic activity ( SUV max = 16.1).  Additional hypermetabolic high right paratracheal lymph nodes and supraclavicular lymph node on the right.  There is  hypermetabolic nodule within the left lobe of the thyroid gland.  Chest:  There is a extremely large right lower paratracheal lymph node measuring 4.8 cm with intense metabolic activity ( SUV max = 24).  This lymph nodes extend slightly left of the trachea (image 91) qualify as contralateral disease.  Within the right upper lobe there is a cluster of hypermetabolic nodules (image 87) with SUV max = 7.1.  The largest measures 12 mm on image 88.  Abdomen/Pelvis:  No abnormal hypermetabolic activity within the liver, pancreas, adrenal glands, or spleen.  No hypermetabolic lymph nodes in the abdomen or pelvis.  Skeleton:  No focal hypermetabolic activity to suggest skeletal metastasis.  IMPRESSION:  1.  Bulky hypermetabolic right paratracheal and supraclavicular adenopathy consistent with metastatic adenopathy.  Consider small cell lung cancer. 2. Right paratracheal adenopathy extends just left of the trachea consistent with contralateral disease.  4.  Hypermetabolic nodule within the central right upper lobe may represent primary lesion. 5.  Hypermetabolic nodule within the left lobe of thyroid gland is indeterminate. 5.  Staging by FDG PET CT imaging T1 N3 M0   Original Report Authenticated By: Genevive Bi, M.D.   US Biopsy  05/06/2013   *RADIOLOGY REPORT*  Clinical data:  Mediastinal, right hilar, right supraclavicular, and cervical adenopathy  ULTRASOUND-GUIDED CORE BIOPSY RIGHT SUPRACLAVICULAR ADENOPATHY  Comparison:  CT 05/03/2013  Technique and findings: The procedure, risks (including but not limited to bleeding, infection, organ damage), benefits, and alternatives were explained to the patient.  Questions regarding the procedure were encouraged and answered.  The patient understands and consents to the procedure.Survey ultrasound of the right neck was performed in the supraclavicular adenopathy was identified. Operator donned sterile gloves and mask.   Site was marked, prepped with Betadine, draped in  usual sterile fashion, infiltrated locally with 1% lidocaine.  Under real time ultrasound guidance, a 17 gauge trocar needle was advanced to the margin of the lesion.  Once needle tip position was confirmed, coaxial 18- gauge core biopsy samples were obtained, submitted in saline to surgical pathology.  The guide needle was removed.  The patient tolerated the procedure well.  No immediate complication. Postprocedure scans show no hematoma.  IMPRESSION: Technically successful ultrasound-guided core biopsy of right supraclavicular adenopathy.   Original Report Authenticated By: D. Andria Rhein, MD       IMPRESSION: The patient has a recent diagnosis of limited stage small cell lung cancer.  The patient has significant regional disease but I would characterize him as an advanced limited stage small cell patient. The patient I  believe will benefit from concurrent chemoradiotherapy.  The patient has begun chemotherapy. I would plan to begin his radiotherapy with his next cycle of chemotherapy scheduled for the beginning of July.  I discussed with the patient the details of a course of radiotherapy. We discussed a potential 6 to 6 1/2 week course of treatment. I discussed with him the rationale of this treatment in addition to the potential side effects and risks. All of his questions were answered. The patient does wish to proceed with this overall treatment plan.   PLAN: The patient will be scheduled for a simulation within the next several days such that we can proceed with treatment planning. We will plan to begin his radiotherapy on 06/15/2013.   I spent 60 minutes minutes face to face with the patient and more than 50% of that time was spent in counseling and/or coordination of care.    ________________________________   Radene Gunning, MD, PhD

## 2013-06-02 NOTE — Progress Notes (Signed)
Please see the Nurse Progress Note in the MD Initial Consult Encounter for this patient. 

## 2013-06-04 ENCOUNTER — Ambulatory Visit
Admission: RE | Admit: 2013-06-04 | Discharge: 2013-06-04 | Disposition: A | Discharge status: Home / Self Care | Payer: Medicare Other | Source: Ambulatory Visit | Attending: Radiation Oncology | Admitting: Radiation Oncology

## 2013-06-04 DIAGNOSIS — J029 Acute pharyngitis, unspecified: Secondary | ICD-10-CM | POA: Insufficient documentation

## 2013-06-04 DIAGNOSIS — R5381 Other malaise: Secondary | ICD-10-CM | POA: Insufficient documentation

## 2013-06-04 DIAGNOSIS — R42 Dizziness and giddiness: Secondary | ICD-10-CM | POA: Insufficient documentation

## 2013-06-04 DIAGNOSIS — R1310 Dysphagia, unspecified: Secondary | ICD-10-CM | POA: Insufficient documentation

## 2013-06-04 DIAGNOSIS — C3490 Malignant neoplasm of unspecified part of unspecified bronchus or lung: Secondary | ICD-10-CM | POA: Insufficient documentation

## 2013-06-04 DIAGNOSIS — I951 Orthostatic hypotension: Secondary | ICD-10-CM | POA: Insufficient documentation

## 2013-06-04 DIAGNOSIS — K209 Esophagitis, unspecified without bleeding: Secondary | ICD-10-CM | POA: Insufficient documentation

## 2013-06-04 DIAGNOSIS — Z510 Encounter for antineoplastic radiation therapy: Secondary | ICD-10-CM | POA: Insufficient documentation

## 2013-06-04 DIAGNOSIS — B370 Candidal stomatitis: Secondary | ICD-10-CM | POA: Insufficient documentation

## 2013-06-04 DIAGNOSIS — E860 Dehydration: Secondary | ICD-10-CM | POA: Insufficient documentation

## 2013-06-04 DIAGNOSIS — M5430 Sciatica, unspecified side: Secondary | ICD-10-CM | POA: Insufficient documentation

## 2013-06-04 DIAGNOSIS — N189 Chronic kidney disease, unspecified: Secondary | ICD-10-CM | POA: Insufficient documentation

## 2013-06-04 DIAGNOSIS — M549 Dorsalgia, unspecified: Secondary | ICD-10-CM | POA: Insufficient documentation

## 2013-06-08 ENCOUNTER — Other Ambulatory Visit (HOSPITAL_BASED_OUTPATIENT_CLINIC_OR_DEPARTMENT_OTHER): Payer: Medicare Other | Admitting: Lab

## 2013-06-08 DIAGNOSIS — C3410 Malignant neoplasm of upper lobe, unspecified bronchus or lung: Secondary | ICD-10-CM

## 2013-06-08 LAB — COMPREHENSIVE METABOLIC PANEL (CC13)
ALT: 28 U/L (ref 0–55)
Alkaline Phosphatase: 83 U/L (ref 40–150)
CO2: 29 mEq/L (ref 22–29)
Sodium: 141 mEq/L (ref 136–145)
Total Bilirubin: 0.38 mg/dL (ref 0.20–1.20)
Total Protein: 6.2 g/dL — ABNORMAL LOW (ref 6.4–8.3)

## 2013-06-08 LAB — CBC WITH DIFFERENTIAL/PLATELET
BASO%: 0.5 % (ref 0.0–2.0)
LYMPH%: 16 % (ref 14.0–49.0)
MCHC: 34.3 g/dL (ref 32.0–36.0)
MONO#: 1.5 10*3/uL — ABNORMAL HIGH (ref 0.1–0.9)
MONO%: 7 % (ref 0.0–14.0)
Platelets: 144 10*3/uL (ref 140–400)
RBC: 3.87 10*6/uL — ABNORMAL LOW (ref 4.20–5.82)
RDW: 12.6 % (ref 11.0–14.6)
WBC: 21.9 10*3/uL — ABNORMAL HIGH (ref 4.0–10.3)

## 2013-06-08 NOTE — Addendum Note (Signed)
Encounter addended by: Jonna Coup, MD on: 06/08/2013 12:19 PM<BR>     Documentation filed: Notes Section

## 2013-06-11 ENCOUNTER — Telehealth: Payer: Self-pay | Admitting: Dietician

## 2013-06-11 NOTE — Telephone Encounter (Deleted)
Brief Outpatient Oncology Nutrition Note  Patient has been identified to be at risk on malnutrition screen.  Wt Readings from Last 10 Encounters:  06/02/13 158 lb 8 oz (71.895 kg)  06/01/13 160 lb 14.4 oz (72.984 kg)  05/25/13 160 lb 11.2 oz (72.893 kg)  05/18/13 158 lb 8 oz (71.895 kg)  04/27/13 160 lb 12.8 oz (72.938 kg)  06/16/12 170 lb (77.111 kg)  05/14/11 175 lb 6.4 oz (79.561 kg)  04/18/10 182 lb (82.555 kg)  08/18/09 188 lb (85.276 kg)  08/26/08 179 lb 6.1 oz (81.367 kg)

## 2013-06-11 NOTE — Telephone Encounter (Signed)
Brief Outpatient Oncology Nutrition Note  Patient has been identified to be at risk on malnutrition screen.  Wt Readings from Last 10 Encounters:  06/02/13 158 lb 8 oz (71.895 kg)  06/01/13 160 lb 14.4 oz (72.984 kg)  05/25/13 160 lb 11.2 oz (72.893 kg)  05/18/13 158 lb 8 oz (71.895 kg)  04/27/13 160 lb 12.8 oz (72.938 kg)  06/16/12 170 lb (77.111 kg)  05/14/11 175 lb 6.4 oz (79.561 kg)  04/18/10 182 lb (82.555 kg)  08/18/09 188 lb (85.276 kg)  08/26/08 179 lb 6.1 oz (81.367 kg)   Patient with limited stage small cell lung cancer.  Spoke with patient a couple weeks ago over the phone who reported he was doing fairly well but at the MD office later that week patient had decreased intake of food and fluids without weight change.    Called and spoke with wife today who reports patient is doing better but would like to meet with the [facility] during his chemo treatment on Tuesday.  Will arrange.  Oran Rein, RD, LDN

## 2013-06-15 ENCOUNTER — Telehealth: Payer: Self-pay | Admitting: Internal Medicine

## 2013-06-15 ENCOUNTER — Other Ambulatory Visit: Payer: Medicare Other | Admitting: Lab

## 2013-06-15 ENCOUNTER — Ambulatory Visit
Admission: RE | Admit: 2013-06-15 | Discharge: 2013-06-15 | Disposition: A | Discharge status: Home / Self Care | Payer: Medicare Other | Source: Ambulatory Visit | Attending: Radiation Oncology | Admitting: Radiation Oncology

## 2013-06-15 ENCOUNTER — Telehealth: Payer: Self-pay | Admitting: *Deleted

## 2013-06-15 ENCOUNTER — Ambulatory Visit (HOSPITAL_BASED_OUTPATIENT_CLINIC_OR_DEPARTMENT_OTHER): Payer: Medicare Other | Admitting: Internal Medicine

## 2013-06-15 ENCOUNTER — Encounter: Payer: Self-pay | Admitting: Internal Medicine

## 2013-06-15 ENCOUNTER — Ambulatory Visit (HOSPITAL_BASED_OUTPATIENT_CLINIC_OR_DEPARTMENT_OTHER): Payer: Medicare Other

## 2013-06-15 ENCOUNTER — Other Ambulatory Visit (HOSPITAL_BASED_OUTPATIENT_CLINIC_OR_DEPARTMENT_OTHER): Payer: Medicare Other | Admitting: Lab

## 2013-06-15 VITALS — BP 109/50 | HR 79 | Temp 98.0°F | Resp 18 | Ht 69.0 in | Wt 157.7 lb

## 2013-06-15 DIAGNOSIS — C3491 Malignant neoplasm of unspecified part of right bronchus or lung: Secondary | ICD-10-CM

## 2013-06-15 DIAGNOSIS — C3410 Malignant neoplasm of upper lobe, unspecified bronchus or lung: Secondary | ICD-10-CM

## 2013-06-15 DIAGNOSIS — C3490 Malignant neoplasm of unspecified part of unspecified bronchus or lung: Secondary | ICD-10-CM | POA: Insufficient documentation

## 2013-06-15 DIAGNOSIS — R0602 Shortness of breath: Secondary | ICD-10-CM

## 2013-06-15 DIAGNOSIS — Z5111 Encounter for antineoplastic chemotherapy: Secondary | ICD-10-CM

## 2013-06-15 LAB — COMPREHENSIVE METABOLIC PANEL (CC13)
Albumin: 3.8 g/dL (ref 3.5–5.0)
BUN: 17.6 mg/dL (ref 7.0–26.0)
CO2: 28 mEq/L (ref 22–29)
Glucose: 132 mg/dl (ref 70–140)
Potassium: 4.2 mEq/L (ref 3.5–5.1)
Sodium: 142 mEq/L (ref 136–145)
Total Bilirubin: 0.46 mg/dL (ref 0.20–1.20)
Total Protein: 6.8 g/dL (ref 6.4–8.3)

## 2013-06-15 LAB — CBC WITH DIFFERENTIAL/PLATELET
BASO%: 0.7 % (ref 0.0–2.0)
LYMPH%: 17.4 % (ref 14.0–49.0)
MCHC: 34.2 g/dL (ref 32.0–36.0)
MCV: 89.6 fL (ref 79.3–98.0)
MONO%: 8.4 % (ref 0.0–14.0)
Platelets: 268 10*3/uL (ref 140–400)
RBC: 3.95 10*6/uL — ABNORMAL LOW (ref 4.20–5.82)
nRBC: 0 % (ref 0–0)

## 2013-06-15 MED ORDER — DEXAMETHASONE SODIUM PHOSPHATE 20 MG/5ML IJ SOLN
20.0000 mg | Freq: Once | INTRAMUSCULAR | Status: AC
  Administered 2013-06-15: 20 mg via INTRAVENOUS

## 2013-06-15 MED ORDER — SODIUM CHLORIDE 0.9 % IV SOLN
120.0000 mg/m2 | Freq: Once | INTRAVENOUS | Status: AC
  Administered 2013-06-15: 230 mg via INTRAVENOUS
  Filled 2013-06-15: qty 11.5

## 2013-06-15 MED ORDER — ONDANSETRON 16 MG/50ML IVPB (CHCC)
16.0000 mg | Freq: Once | INTRAVENOUS | Status: AC
  Administered 2013-06-15: 16 mg via INTRAVENOUS

## 2013-06-15 MED ORDER — SODIUM CHLORIDE 0.9 % IV SOLN
452.5000 mg | Freq: Once | INTRAVENOUS | Status: AC
  Administered 2013-06-15: 450 mg via INTRAVENOUS
  Filled 2013-06-15: qty 45

## 2013-06-15 MED ORDER — SODIUM CHLORIDE 0.9 % IV SOLN
Freq: Once | INTRAVENOUS | Status: AC
  Administered 2013-06-15: 14:00:00 via INTRAVENOUS

## 2013-06-15 NOTE — Patient Instructions (Addendum)
[facility] Discharge Instructions for Patients Receiving Chemotherapy  Today you received the following chemotherapy agents Etoposide/Carboplatin.  To help prevent nausea and vomiting after your treatment, we encourage you to take your nausea medication as prescribed.   If you develop nausea and vomiting that is not controlled by your nausea medication, call the clinic.   BELOW ARE SYMPTOMS THAT SHOULD BE REPORTED IMMEDIATELY:  *FEVER GREATER THAN 100.5 F  *CHILLS WITH OR WITHOUT FEVER  NAUSEA AND VOMITING THAT IS NOT CONTROLLED WITH YOUR NAUSEA MEDICATION  *UNUSUAL SHORTNESS OF BREATH  *UNUSUAL BRUISING OR BLEEDING  TENDERNESS IN MOUTH AND THROAT WITH OR WITHOUT PRESENCE OF ULCERS  *URINARY PROBLEMS  *BOWEL PROBLEMS  UNUSUAL RASH Items with * indicate a potential emergency and should be followed up as soon as possible.  Feel free to call the clinic you have any questions or concerns. The clinic phone number is [phone].    

## 2013-06-15 NOTE — Telephone Encounter (Signed)
gv and printed appt sched and avs for pt...MW aded tx.

## 2013-06-15 NOTE — Patient Instructions (Signed)
Continue systemic chemotherapy today as scheduled.  Followup visit in 3 weeks with repeat CT scan of the chest, abdomen and pelvis.

## 2013-06-15 NOTE — Telephone Encounter (Signed)
Per staff phone call and POF I have schedueld appts.  JMW  

## 2013-06-15 NOTE — Progress Notes (Signed)
[facility] Telephone:([phone]) [phone]   Fax:([phone]  OFFICE PROGRESS NOTE  Michele Mcalpine, MD [address] [place] [address]  DIAGNOSIS: Limited stage small cell lung cancer diagnosed in May of 2014.   PRIOR THERAPY: None.   CURRENT THERAPY: Systemic chemotherapy with carboplatin for AUC of 5 on day 1 and etoposide 120 mg/M2 on days 1, 2 and 3 with Neulasta support on day 4 every 3 weeks. First cycle was on 05/25/13.  INTERVAL HISTORY: Cory [name] 79 y.o. [name] returns to the clinic today for followup visit accompanied his wife. The patient tolerated the first cycle of his systemic chemotherapy fairly well with no significant adverse effects except for mild fatigue and alopecia. He denied having any significant nausea or vomiting. He denied having any fever or chills. He has no peripheral neuropathy. The patient denied having any significant chest pain but continues to have shortness of breath with exertion, no cough or hemoptysis. He has no significant weight loss or night sweats.  MEDICAL HISTORY: Past Medical History  Diagnosis Date  . OSA (obstructive sleep apnea)   . COPD (chronic obstructive pulmonary disease)   . Cigarette smoker   . Hypertension   . Venous insufficiency   . Hypercholesteremia   . Borderline diabetes mellitus   . Diverticulosis of colon   . Hx of colonic polyps   . BPH (benign prostatic hyperplasia)   . DJD (degenerative joint disease)   . Lumbar back pain   . Leg cramps   . History of skin cancer   . History of shingles   . History of chemotherapy 05/25/2013    carboplatin, etoposide, neulasta every 3 weeks     ALLERGIES:  is allergic to niacin.  MEDICATIONS:  Current Outpatient Prescriptions  Medication Sig Dispense Refill  . amLODipine (NORVASC) 5 MG tablet TAKE ONE TABLET BY MOUTH ONE TIME DAILY  90 tablet  0  . aspirin 81 MG tablet Take 81 mg by mouth daily.        . Cetirizine HCl (ZYRTEC ALLERGY) 10 MG CAPS Take  1 capsule by mouth.      . Cholecalciferol (VITAMIN D) 2000 UNITS CAPS Take 1 capsule by mouth daily.        Marland Kitchen latanoprost (XALATAN) 0.005 % ophthalmic solution Place 1 drop into the left eye at bedtime.        . Multiple Vitamin (MULTIVITAMIN) capsule Take 1 capsule by mouth daily.        . potassium chloride SA (KLOR-CON M20) 20 MEQ tablet Take 1 tablet (20 mEq total) by mouth 2 (two) times daily.  180 tablet  3  . prochlorperazine (COMPAZINE) 10 MG tablet Take 1 tablet (10 mg total) by mouth every 6 (six) hours as needed.  60 tablet  0  . simvastatin (ZOCOR) 40 MG tablet Take 1/2 tablet by mouth daily      . Trolamine Salicylate (MYOFLEX EX) Apply 1 application topically 2 (two) times daily as needed. For pain  AS NEEDED      . vitamin C (ASCORBIC ACID) 500 MG tablet Take 500 mg by mouth daily.         No current facility-administered medications for this visit.    SURGICAL HISTORY:  Past Surgical History  Procedure Laterality Date  . Appendectomy    . Tonsillectomy and adenoidectomy    . Left inguinal hernia repair  1998    Dr. Earlene Plater  . Lumbar laminectomy  1997    Dr. Roxan Hockey  . Lymph node biopsy  05/06/2013    REVIEW OF SYSTEMS:  A comprehensive review of systems was negative except for: Constitutional: positive for fatigue Respiratory: positive for dyspnea on exertion   PHYSICAL EXAMINATION: General appearance: alert, cooperative and no distress Head: Normocephalic, without obvious abnormality, atraumatic Neck: no adenopathy Lymph nodes: Cervical, supraclavicular, and axillary nodes normal. Resp: clear to auscultation bilaterally Cardio: regular rate and rhythm, S1, S2 normal, no murmur, click, rub or gallop GI: soft, non-tender; bowel sounds normal; no masses,  no organomegaly Extremities: extremities normal, atraumatic, no cyanosis or edema Neurologic: Alert and oriented X 3, normal strength and tone. Normal symmetric reflexes. Normal coordination and gait  ECOG  PERFORMANCE STATUS: 1 - Symptomatic but completely ambulatory  Blood pressure 109/50, pulse 79, temperature 98 F (36.7 C), temperature source Oral, resp. rate 18, height 5\' 9"  (1.753 m), weight 157 lb 11.2 oz (71.532 kg), SpO2 97.00%.  LABORATORY DATA: Lab Results  Component Value Date   WBC 14.9* 06/15/2013   HGB 12.1* 06/15/2013   HCT 35.4* 06/15/2013   MCV 89.6 06/15/2013   PLT 268 06/15/2013      Chemistry      Component Value Date/Time   NA 141 06/08/2013 1426   NA 140 04/27/2013 1631   K 3.8 06/08/2013 1426   K 3.5 04/27/2013 1631   CL 104 06/08/2013 1426   CL 102 04/27/2013 1631   CO2 29 06/08/2013 1426   CO2 32 04/27/2013 1631   BUN 16.9 06/08/2013 1426   BUN 14 04/27/2013 1631   CREATININE 0.9 06/08/2013 1426   CREATININE 1.0 04/27/2013 1631      Component Value Date/Time   CALCIUM 9.0 06/08/2013 1426   CALCIUM 9.0 04/27/2013 1631   ALKPHOS 83 06/08/2013 1426   ALKPHOS 42 06/22/2012 0758   AST 24 06/08/2013 1426   AST 24 06/22/2012 0758   ALT 28 06/08/2013 1426   ALT 21 06/22/2012 0758   BILITOT 0.38 06/08/2013 1426   BILITOT 2.0* 06/22/2012 0758       RADIOGRAPHIC STUDIES: Dg Eye Foreign Body  05/20/2013   *RADIOLOGY REPORT*  Clinical Data: 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  Findings: Views of the orbits were obtained with the patient looking to the left and looking to the right.  No orbital metallic foreign body is seen.  No bony abnormality is noted.  The paranasal sinuses that are visualized are clear.  Multiple dental implants are noted.  IMPRESSION: No orbital metallic foreign body.   Original Report Authenticated By: Dwyane Dee, M.D.   Cory [name] AV Contrast  05/21/2013   *RADIOLOGY REPORT*  Clinical Data: Lung cancer.  Staging. History of hypertension. History of tobacco abuse.  History of COPD.  MRI HEAD WITHOUT AND WITH CONTRAST  Technique:  Multiplanar, multiecho pulse sequences of the brain and  surrounding structures were obtained according to standard protocol without and with intravenous contrast  Contrast: 15mL MULTIHANCE GADOBENATE DIMEGLUMINE 529 MG/ML IV SOLN  Comparison: None.  Findings: No acute stroke or hemorrhage.  No mass lesion or hydrocephalus.  Moderate atrophy.  Extensive chronic microvascular ischemic change. Widespread prominence perivascular spaces suggest longstanding hypertension.  Widely patent internal carotid arteries, basilar artery, and vertebral arteries.  No osseous lesions.  Pituitary and cerebellar tonsils unremarkable.  No cervical canal stenosis.  Post infusion imaging does not reveal  clear-cut intracranial metastatic disease.  There are small foci of enhancement which can be seen on occasional sagittal images which cannot be correlated with similar foci of axial or coronal imaging, and are felt to represent  end on vessels.  Incompletely evaluated right cervical adenopathy.  No skull base or calvarial lesions.  Major dural venous sinuses appear patent.  No meningeal enhancement. No acute sinus disease.  Negative mastoids.  IMPRESSION: Moderate atrophy.  Extensive chronic microvascular ischemic change. No intracranial metastatic disease is present.   Original Report Authenticated By: Davonna Belling, M.D.   Nm Pet Image Initial (pi) Skull Base To Thigh  05/24/2013   *RADIOLOGY REPORT*  Clinical Data: Initial treatment strategy for lung cancer.  NUCLEAR MEDICINE PET SKULL BASE TO THIGH  Fasting Blood Glucose:  114  Technique:  18.4 mCi F-18 FDG was injected intravenously. CT data was obtained and used for attenuation correction and anatomic localization only.  (This was not acquired as a diagnostic CT examination.) Additional exam technical data entered on technologist worksheet.  Comparison:  Chest CT 05/03/2013  Findings:  Neck: There is a 2.5 cm level three lymph node which is present on image 39 which does not have significant metabolic activity.  More inferiorly there is a  large level IV lymph node adjacent to thyroid gland measuring 18 mm ( image 62) with intense metabolic activity ( SUV max = 16.1).  Additional hypermetabolic high right paratracheal lymph nodes and supraclavicular lymph node on the right.  There is hypermetabolic nodule within the left lobe of the thyroid gland.  Chest:  There is a extremely large right lower paratracheal lymph node measuring 4.8 cm with intense metabolic activity ( SUV max = 24).  This lymph nodes extend slightly left of the trachea (image 91) qualify as contralateral disease.  Within the right upper lobe there is a cluster of hypermetabolic nodules (image 87) with SUV max = 7.1.  The largest measures 12 mm on image 88.  Abdomen/Pelvis:  No abnormal hypermetabolic activity within the liver, pancreas, adrenal glands, or spleen.  No hypermetabolic lymph nodes in the abdomen or pelvis.  Skeleton:  No focal hypermetabolic activity to suggest skeletal metastasis.  IMPRESSION:  1.  Bulky hypermetabolic right paratracheal and supraclavicular adenopathy consistent with metastatic adenopathy.  Consider small cell lung cancer. 2. Right paratracheal adenopathy extends just left of the trachea consistent with contralateral disease.  4.  Hypermetabolic nodule within the central right upper lobe may represent primary lesion. 5.  Hypermetabolic nodule within the left lobe of thyroid gland is indeterminate. 5.  Staging by FDG PET CT imaging T1 N3 M0   Original Report Authenticated By: Genevive Bi, M.D.    ASSESSMENT AND PLAN: This is a very pleasant 79 years old white [name] with limited stage small cell lung cancer currently undergoing systemic chemotherapy with carboplatin and etoposide and he will start concurrent radiotherapy today under the care of Dr. Mitzi Hansen. The patient had several questions today about his condition that answered completely to his satisfaction. We will continue systemic chemotherapy today as scheduled. The patient would come back for  followup visit in 3 weeks with the next cycle of his systemic treatment after repeating CT scan of the chest, abdomen and pelvis for restaging of his disease. He was advised to call immediately if he has any concerning symptoms in the interval.  All questions were answered. The patient knows to call the clinic with any problems, questions or concerns. We can certainly see the patient much  sooner if necessary.  I spent 15 minutes counseling the patient face to face. The total time spent in the appointment was 25 minutes.

## 2013-06-16 ENCOUNTER — Ambulatory Visit (HOSPITAL_BASED_OUTPATIENT_CLINIC_OR_DEPARTMENT_OTHER): Payer: Medicare Other

## 2013-06-16 ENCOUNTER — Ambulatory Visit: Payer: Medicare Other | Admitting: Nutrition

## 2013-06-16 ENCOUNTER — Ambulatory Visit
Admission: RE | Admit: 2013-06-16 | Discharge: 2013-06-16 | Disposition: A | Discharge status: Home / Self Care | Payer: Medicare Other | Source: Ambulatory Visit | Attending: Radiation Oncology | Admitting: Radiation Oncology

## 2013-06-16 DIAGNOSIS — C778 Secondary and unspecified malignant neoplasm of lymph nodes of multiple regions: Secondary | ICD-10-CM

## 2013-06-16 DIAGNOSIS — Z5111 Encounter for antineoplastic chemotherapy: Secondary | ICD-10-CM

## 2013-06-16 DIAGNOSIS — C3410 Malignant neoplasm of upper lobe, unspecified bronchus or lung: Secondary | ICD-10-CM

## 2013-06-16 MED ORDER — SODIUM CHLORIDE 0.9 % IV SOLN
Freq: Once | INTRAVENOUS | Status: AC
  Administered 2013-06-16: 16:00:00 via INTRAVENOUS

## 2013-06-16 MED ORDER — ONDANSETRON 8 MG/50ML IVPB (CHCC)
8.0000 mg | Freq: Once | INTRAVENOUS | Status: AC
  Administered 2013-06-16: 8 mg via INTRAVENOUS

## 2013-06-16 MED ORDER — SODIUM CHLORIDE 0.9 % IV SOLN
120.0000 mg/m2 | Freq: Once | INTRAVENOUS | Status: AC
  Administered 2013-06-16: 230 mg via INTRAVENOUS
  Filled 2013-06-16: qty 11.5

## 2013-06-16 MED ORDER — DEXAMETHASONE SODIUM PHOSPHATE 10 MG/ML IJ SOLN
10.0000 mg | Freq: Once | INTRAMUSCULAR | Status: AC
  Administered 2013-06-16: 10 mg via INTRAVENOUS

## 2013-06-16 NOTE — Progress Notes (Signed)
Patient is an 79 year old male diagnosed with small cell lung cancer.  He is a patient of Dr. Shirline Frees.  Past medical history includes COPD, tobacco, hypertension, hypercholesterolemia, diabetes, chemotherapy.  Medications include vitamin D, multivitamin, Compazine, Zocor, and ascorbic acid.    Labs were reviewed.  Height: 69 inches. Weight: 157.7 pounds. Usual body weight: 160 pounds per patient. BMI: 23.28.  Patient reports he is trying to "graze" throughout the day.  He has been drinking Premier protein shakes.  He denies nutrition side effects at this time.  He has multiple nutrition questions, many not related to cancer or cancer treatment.  Nutrition diagnosis: Food and nutrition related knowledge deficit related to new diagnosis of small cell lung cancer and associated treatments as evidenced by no prior need for nutrition related information.  Intervention: Patient and friend were educated on the importance of smaller, more frequent meals utilizing adequate calories and protein to promote weight maintenance.  Patient was educated on the importance of preventing nausea with medications as needed.  I've educated both of them on foods safety.  I've encouraged weight maintenance.  Fact sheets were provided.  Questions were answered.  Teach back method used.  Monitoring, evaluation, goals: Patient will tolerate adequate calories and protein to promote weight maintenance throughout treatment.    Next visit: Patient has my contact information for questions or concerns

## 2013-06-16 NOTE — Patient Instructions (Addendum)
[facility] Discharge Instructions for Patients Receiving Chemotherapy  Today you received the following chemotherapy agents: Etoposide.  To help prevent nausea and vomiting after your treatment, we encourage you to take your nausea medication as prescribed.   If you develop nausea and vomiting that is not controlled by your nausea medication, call the clinic.   BELOW ARE SYMPTOMS THAT SHOULD BE REPORTED IMMEDIATELY:  *FEVER GREATER THAN 100.5 F  *CHILLS WITH OR WITHOUT FEVER  NAUSEA AND VOMITING THAT IS NOT CONTROLLED WITH YOUR NAUSEA MEDICATION  *UNUSUAL SHORTNESS OF BREATH  *UNUSUAL BRUISING OR BLEEDING  TENDERNESS IN MOUTH AND THROAT WITH OR WITHOUT PRESENCE OF ULCERS  *URINARY PROBLEMS  *BOWEL PROBLEMS  UNUSUAL RASH Items with * indicate a potential emergency and should be followed up as soon as possible.  Feel free to call the clinic you have any questions or concerns. The clinic phone number is [phone].    

## 2013-06-17 ENCOUNTER — Ambulatory Visit (HOSPITAL_BASED_OUTPATIENT_CLINIC_OR_DEPARTMENT_OTHER): Payer: Medicare Other

## 2013-06-17 ENCOUNTER — Ambulatory Visit
Admission: RE | Admit: 2013-06-17 | Discharge: 2013-06-17 | Disposition: A | Discharge status: Home / Self Care | Payer: Medicare Other | Source: Ambulatory Visit | Attending: Radiation Oncology | Admitting: Radiation Oncology

## 2013-06-17 ENCOUNTER — Telehealth: Payer: Self-pay | Admitting: *Deleted

## 2013-06-17 DIAGNOSIS — Z5111 Encounter for antineoplastic chemotherapy: Secondary | ICD-10-CM

## 2013-06-17 DIAGNOSIS — C3410 Malignant neoplasm of upper lobe, unspecified bronchus or lung: Secondary | ICD-10-CM

## 2013-06-17 MED ORDER — DEXAMETHASONE SODIUM PHOSPHATE 10 MG/ML IJ SOLN
10.0000 mg | Freq: Once | INTRAMUSCULAR | Status: AC
  Administered 2013-06-17: 10 mg via INTRAVENOUS

## 2013-06-17 MED ORDER — SODIUM CHLORIDE 0.9 % IV SOLN
Freq: Once | INTRAVENOUS | Status: AC
  Administered 2013-06-17: 15:00:00 via INTRAVENOUS

## 2013-06-17 MED ORDER — RADIAPLEXRX EX GEL
Freq: Once | CUTANEOUS | Status: AC
  Administered 2013-06-17: 15:00:00 via TOPICAL

## 2013-06-17 MED ORDER — ONDANSETRON 8 MG/50ML IVPB (CHCC)
8.0000 mg | Freq: Once | INTRAVENOUS | Status: AC
  Administered 2013-06-17: 8 mg via INTRAVENOUS

## 2013-06-17 MED ORDER — SODIUM CHLORIDE 0.9 % IV SOLN
120.0000 mg/m2 | Freq: Once | INTRAVENOUS | Status: AC
  Administered 2013-06-17: 230 mg via INTRAVENOUS
  Filled 2013-06-17: qty 11.5

## 2013-06-17 NOTE — Telephone Encounter (Signed)
Pt came by to move his lab appt for 06/22/13 closer to his [facility] appt. gv lab appt for 06/22/13 @11 :30am. ....td

## 2013-06-17 NOTE — Progress Notes (Signed)
Post sim ed completed w/pt; gave pt "Radiation and You" booklet. Marked and discussed fatigue, nutrition, throat irritation/management, pain, skin irritation/care. Gave pt Radiaplex w/instructions for proper use.  Pt verbalized understanding. No c/o today.

## 2013-06-17 NOTE — Patient Instructions (Addendum)
[facility] Discharge Instructions for Patients Receiving Chemotherapy  Today you received the following chemotherapy agents Etoposide  To help prevent nausea and vomiting after your treatment, we encourage you to take your nausea medication as needed   If you develop nausea and vomiting that is not controlled by your nausea medication, call the clinic.   BELOW ARE SYMPTOMS THAT SHOULD BE REPORTED IMMEDIATELY:  *FEVER GREATER THAN 100.5 F  *CHILLS WITH OR WITHOUT FEVER  NAUSEA AND VOMITING THAT IS NOT CONTROLLED WITH YOUR NAUSEA MEDICATION  *UNUSUAL SHORTNESS OF BREATH  *UNUSUAL BRUISING OR BLEEDING  TENDERNESS IN MOUTH AND THROAT WITH OR WITHOUT PRESENCE OF ULCERS  *URINARY PROBLEMS  *BOWEL PROBLEMS  UNUSUAL RASH Items with * indicate a potential emergency and should be followed up as soon as possible.  Feel free to call the clinic you have any questions or concerns. The clinic phone number is [phone].    

## 2013-06-17 NOTE — Progress Notes (Signed)
   Department of Radiation Oncology  Phone:  [phone] Fax:        [phone]  Weekly Treatment Note    Name: Cory [name] Date: 06/17/2013 MRN: [id number] DOB: [birth year]   Current dose: 5.4 Gy  Current fraction: 3   MEDICATIONS: Current Outpatient Prescriptions  Medication Sig Dispense Refill  . amLODipine (NORVASC) 5 MG tablet TAKE ONE TABLET BY MOUTH ONE TIME DAILY  90 tablet  0  . aspirin 81 MG tablet Take 81 mg by mouth daily.        . Cetirizine HCl (ZYRTEC ALLERGY) 10 MG CAPS Take 1 capsule by mouth.      . Cholecalciferol (VITAMIN D) 2000 UNITS CAPS Take 1 capsule by mouth daily.        . hyaluronate sodium (RADIAPLEXRX) GEL Apply topically 2 (two) times daily.      Marland Kitchen latanoprost (XALATAN) 0.005 % ophthalmic solution Place 1 drop into the left eye at bedtime.        . Multiple Vitamin (MULTIVITAMIN) capsule Take 1 capsule by mouth daily.        . potassium chloride SA (KLOR-CON M20) 20 MEQ tablet Take 1 tablet (20 mEq total) by mouth 2 (two) times daily.  180 tablet  3  . prochlorperazine (COMPAZINE) 10 MG tablet Take 1 tablet (10 mg total) by mouth every 6 (six) hours as needed.  60 tablet  0  . simvastatin (ZOCOR) 40 MG tablet Take 1/2 tablet by mouth daily      . Trolamine Salicylate (MYOFLEX EX) Apply 1 application topically 2 (two) times daily as needed. For pain  AS NEEDED      . vitamin C (ASCORBIC ACID) 500 MG tablet Take 500 mg by mouth daily.         No current facility-administered medications for this encounter.     ALLERGIES: Niacin   LABORATORY DATA:  Lab Results  Component Value Date   WBC 14.9* 06/15/2013   HGB 12.1* 06/15/2013   HCT 35.4* 06/15/2013   MCV 89.6 06/15/2013   PLT 268 06/15/2013   Lab Results  Component Value Date   NA 142 06/15/2013   K 4.2 06/15/2013   CL 104 06/08/2013   CO2 28 06/15/2013   Lab Results  Component Value Date   ALT 20 06/15/2013   AST 20 06/15/2013   ALKPHOS 70 06/15/2013   BILITOT 0.46 06/15/2013     NARRATIVE: Cory  R [name] was seen today for weekly treatment management. The chart was checked and the patient's films were reviewed. The patient is doing well. No problems with the beginning of his treatment. The patient had some good questions today which were answered.  PHYSICAL EXAMINATION: weight is 162 lb 9.6 oz (73.755 kg). His temperature is 98.2 F (36.8 C). His blood pressure is 129/67 and his pulse is 73. His respiration is 20.        ASSESSMENT: The patient is doing satisfactorily with treatment.  PLAN: We will continue with the patient's radiation treatment as planned.

## 2013-06-18 NOTE — Progress Notes (Addendum)
  Radiation Oncology         [phone] ________________________________  Name: Cory [name] MRN: [id number]  Date: 06/04/2013  DOB: 1929-10-04  SIMULATION AND TREATMENT PLANNING NOTE  DIAGNOSIS:  Small cell lung cancer  NARRATIVE:  The patient was brought to the CT Simulation planning suite.  Identity was confirmed.  All relevant records and images related to the planned course of therapy were reviewed.   Written consent to proceed with treatment was confirmed which was freely given after reviewing the details related to the planned course of therapy had been reviewed with the patient.  Then, the patient was set-up in a stable reproducible  supine position for radiation therapy.  CT images were obtained.  Surface markings were placed.  A customized thermoplastic head chest was constructed which also covered the patient's shoulders. This complex treatment device will be used on a daily basis during his treatment.  The CT images were loaded into the planning software.  Then the target and avoidance structures were contoured.  Treatment planning then occurred.  The radiation prescription was entered and confirmed.   I have requested : Intensity Modulated Radiotherapy (IMRT) is medically necessary for this case because of the extent of the disease and the nearby critical normal structures which otherwise are not able to be adequately spared with a 3-D conformal technique. These nearby critical structures including the spinal cord, lungs, and heart.   Daily image guidance will be used to ensure accurate localization of the target and to ensure accurate sparing of the normal structures. This is medically necessary given the close proximity of the structures.  PLAN:  The patient will receive 63 Gy in 30 fractions.   Special treatment procedure The patient will receive chemotherapy during the course of radiation treatment. The patient may experience increased or overlapping toxicity due to this  combined-modality approach and the patient will be monitored for such problems. This may include extra lab work as necessary. This therefore constitutes a special treatment procedure.  ________________________________   Radene Gunning, MD, PhD

## 2013-06-19 ENCOUNTER — Ambulatory Visit (HOSPITAL_BASED_OUTPATIENT_CLINIC_OR_DEPARTMENT_OTHER): Payer: Medicare Other

## 2013-06-19 DIAGNOSIS — C3410 Malignant neoplasm of upper lobe, unspecified bronchus or lung: Secondary | ICD-10-CM

## 2013-06-19 DIAGNOSIS — Z5189 Encounter for other specified aftercare: Secondary | ICD-10-CM

## 2013-06-19 MED ORDER — PEGFILGRASTIM INJECTION 6 MG/0.6ML
6.0000 mg | Freq: Once | SUBCUTANEOUS | Status: AC
  Administered 2013-06-19: 6 mg via SUBCUTANEOUS

## 2013-06-21 ENCOUNTER — Ambulatory Visit
Admission: RE | Admit: 2013-06-21 | Discharge: 2013-06-21 | Disposition: A | Discharge status: Home / Self Care | Payer: Medicare Other | Source: Ambulatory Visit | Attending: Radiation Oncology | Admitting: Radiation Oncology

## 2013-06-21 ENCOUNTER — Other Ambulatory Visit: Payer: Self-pay | Admitting: Certified Registered Nurse Anesthetist

## 2013-06-22 ENCOUNTER — Other Ambulatory Visit (HOSPITAL_BASED_OUTPATIENT_CLINIC_OR_DEPARTMENT_OTHER): Payer: Medicare Other | Admitting: Lab

## 2013-06-22 ENCOUNTER — Other Ambulatory Visit: Payer: Medicare Other

## 2013-06-22 ENCOUNTER — Ambulatory Visit
Admission: RE | Admit: 2013-06-22 | Discharge: 2013-06-22 | Disposition: A | Discharge status: Home / Self Care | Payer: Medicare Other | Source: Ambulatory Visit | Attending: Radiation Oncology | Admitting: Radiation Oncology

## 2013-06-22 DIAGNOSIS — C3410 Malignant neoplasm of upper lobe, unspecified bronchus or lung: Secondary | ICD-10-CM

## 2013-06-22 LAB — CBC WITH DIFFERENTIAL/PLATELET
Basophils Absolute: 0 10*3/uL (ref 0.0–0.1)
EOS%: 0.2 % (ref 0.0–7.0)
HCT: 30.7 % — ABNORMAL LOW (ref 38.4–49.9)
HGB: 10.8 g/dL — ABNORMAL LOW (ref 13.0–17.1)
MCH: 31.5 pg (ref 27.2–33.4)
MONO#: 0.1 10*3/uL (ref 0.1–0.9)
NEUT#: 10 10*3/uL — ABNORMAL HIGH (ref 1.5–6.5)
NEUT%: 89.7 % — ABNORMAL HIGH (ref 39.0–75.0)
RDW: 13.3 % (ref 11.0–14.6)
WBC: 11.2 10*3/uL — ABNORMAL HIGH (ref 4.0–10.3)
lymph#: 1 10*3/uL (ref 0.9–3.3)

## 2013-06-22 LAB — COMPREHENSIVE METABOLIC PANEL (CC13)
ALT: 18 U/L (ref 0–55)
AST: 14 U/L (ref 5–34)
Albumin: 3.3 g/dL — ABNORMAL LOW (ref 3.5–5.0)
BUN: 20 mg/dL (ref 7.0–26.0)
CO2: 29 mEq/L (ref 22–29)
Calcium: 8.8 mg/dL (ref 8.4–10.4)
Chloride: 105 mEq/L (ref 98–109)
Potassium: 3.5 mEq/L (ref 3.5–5.1)

## 2013-06-23 ENCOUNTER — Telehealth: Payer: Self-pay | Admitting: Internal Medicine

## 2013-06-23 ENCOUNTER — Other Ambulatory Visit: Payer: Self-pay | Admitting: Pulmonary Disease

## 2013-06-23 ENCOUNTER — Ambulatory Visit
Admission: RE | Admit: 2013-06-23 | Discharge: 2013-06-23 | Disposition: A | Discharge status: Home / Self Care | Payer: Medicare Other | Source: Ambulatory Visit | Attending: Radiation Oncology | Admitting: Radiation Oncology

## 2013-06-23 NOTE — Telephone Encounter (Signed)
R/s ct to 7/18 per pt rqst an pt aware of appt on 07/06/13 lab, md and chemo

## 2013-06-24 ENCOUNTER — Ambulatory Visit
Admission: RE | Admit: 2013-06-24 | Discharge: 2013-06-24 | Disposition: A | Discharge status: Home / Self Care | Payer: Medicare Other | Source: Ambulatory Visit | Attending: Radiation Oncology | Admitting: Radiation Oncology

## 2013-06-25 ENCOUNTER — Ambulatory Visit
Admission: RE | Admit: 2013-06-25 | Discharge: 2013-06-25 | Disposition: A | Discharge status: Home / Self Care | Payer: Medicare Other | Source: Ambulatory Visit | Attending: Radiation Oncology | Admitting: Radiation Oncology

## 2013-06-25 ENCOUNTER — Encounter: Payer: Self-pay | Admitting: Radiation Oncology

## 2013-06-25 VITALS — BP 100/59 | HR 87 | Temp 98.3°F | Ht 69.0 in | Wt 155.8 lb

## 2013-06-25 DIAGNOSIS — C3490 Malignant neoplasm of unspecified part of unspecified bronchus or lung: Secondary | ICD-10-CM

## 2013-06-25 NOTE — Progress Notes (Signed)
  Radiation Oncology         [phone] ________________________________  Name: Cory [name] MRN: [id number]  Date: 06/25/2013  DOB: [birth year]  Weekly Radiation Therapy Management  Current Dose: 14.4 Gy     Planned Dose:  63 Gy  Narrative . . . . . . . . The patient presents for routine under treatment assessment.                                                  Seraphim Manrique here for weekly under treat visit. He has had 8 fractions to his lung. He denies sore throat and shortness of breath. He states he feels "foggy and unfocused." He is reporting a flare up of sciatic nerve pain in his right leg with burning in his foot. He is rating it at a 2/10. His last chemotherapy treatment was last week. He does have fatigue. The skin on his chest is intact. He is using radiaplex gel occasionally                                 Set-up films were reviewed.                                 The chart was checked. Physical Findings. . .  height is 5\' 9"  (1.753 m) and weight is 155 lb 12.8 oz (70.67 kg). His temperature is 98.3 F (36.8 C). His blood pressure is 100/59 and his pulse is 87. His oxygen saturation is 100%. . Weight essentially stable.  No significant changes. Impression . . . . . . . The patient is tolerating radiation. Plan . . . . . . . . . . . . Continue treatment as planned.  ________________________________  Artist Pais. Kathrynn Running, M.D.

## 2013-06-25 NOTE — Progress Notes (Signed)
Cory [name] here for weekly under treat visit.  He has had 8 fractions to his lung.  He denies sore throat and shortness of breath.  He states he feels "foggy and unfocused."  He is reporting a flare up of sciatic nerve pain in his right leg with burning in his foot.  He is rating it at a 2/10.  His last chemotherapy treatment was last week.  He does have fatigue.  The skin on his chest is intact.  He is using radiaplex gel occasionally.

## 2013-06-28 ENCOUNTER — Ambulatory Visit
Admission: RE | Admit: 2013-06-28 | Discharge: 2013-06-28 | Disposition: A | Discharge status: Home / Self Care | Payer: Medicare Other | Source: Ambulatory Visit | Attending: Radiation Oncology | Admitting: Radiation Oncology

## 2013-06-29 ENCOUNTER — Telehealth: Payer: Self-pay | Admitting: *Deleted

## 2013-06-29 ENCOUNTER — Other Ambulatory Visit (HOSPITAL_BASED_OUTPATIENT_CLINIC_OR_DEPARTMENT_OTHER): Payer: Medicare Other | Admitting: Lab

## 2013-06-29 ENCOUNTER — Ambulatory Visit
Admission: RE | Admit: 2013-06-29 | Discharge: 2013-06-29 | Disposition: A | Discharge status: Home / Self Care | Payer: Medicare Other | Source: Ambulatory Visit | Attending: Radiation Oncology | Admitting: Radiation Oncology

## 2013-06-29 DIAGNOSIS — C3490 Malignant neoplasm of unspecified part of unspecified bronchus or lung: Secondary | ICD-10-CM

## 2013-06-29 LAB — CBC WITH DIFFERENTIAL/PLATELET
BASO%: 1 % (ref 0.0–2.0)
EOS%: 0.6 % (ref 0.0–7.0)
MCH: 30.4 pg (ref 27.2–33.4)
MCHC: 33.8 g/dL (ref 32.0–36.0)
MONO#: 0.8 10*3/uL (ref 0.1–0.9)
RDW: 13.7 % (ref 11.0–14.6)
WBC: 8.3 10*3/uL (ref 4.0–10.3)
lymph#: 1.4 10*3/uL (ref 0.9–3.3)

## 2013-06-29 LAB — COMPREHENSIVE METABOLIC PANEL (CC13)
ALT: 25 U/L (ref 0–55)
AST: 19 U/L (ref 5–34)
Albumin: 3.5 g/dL (ref 3.5–5.0)
Calcium: 8.7 mg/dL (ref 8.4–10.4)
Chloride: 105 mEq/L (ref 98–109)
Potassium: 3.4 mEq/L — ABNORMAL LOW (ref 3.5–5.1)
Sodium: 142 mEq/L (ref 136–145)
Total Protein: 5.8 g/dL — ABNORMAL LOW (ref 6.4–8.3)

## 2013-06-29 NOTE — Telephone Encounter (Signed)
Pt called stating that he is having a flare up for sciatica and wants to know whether it is r/s his cancer.  Per Dr Donnald Garre, it should not be r/t his cancer and he needs to consult with whomever had helped him with his sciatica in the past.

## 2013-06-30 ENCOUNTER — Encounter: Payer: Self-pay | Admitting: Radiation Oncology

## 2013-06-30 ENCOUNTER — Ambulatory Visit
Admission: RE | Admit: 2013-06-30 | Discharge: 2013-06-30 | Disposition: A | Discharge status: Home / Self Care | Payer: Medicare Other | Source: Ambulatory Visit | Attending: Radiation Oncology | Admitting: Radiation Oncology

## 2013-06-30 NOTE — Progress Notes (Signed)
   Department of Radiation Oncology  Phone:  [phone] Fax:        [phone]  Weekly Treatment Note    Name: Cory [name] Date: 06/30/2013 MRN: [id number] DOB: 02/18/1929   Current dose: 19.8 Gy  Current fraction: 11   MEDICATIONS: Current Outpatient Prescriptions  Medication Sig Dispense Refill  . amLODipine (NORVASC) 5 MG tablet TAKE ONE TABLET BY MOUTH ONE TIME DAILY  90 tablet  0  . aspirin 81 MG tablet Take 81 mg by mouth daily.        . Cetirizine HCl (ZYRTEC ALLERGY) 10 MG CAPS Take 1 capsule by mouth.      . Cholecalciferol (VITAMIN D) 2000 UNITS CAPS Take 1 capsule by mouth daily.        . hyaluronate sodium (RADIAPLEXRX) GEL Apply topically 2 (two) times daily.      Marland Kitchen latanoprost (XALATAN) 0.005 % ophthalmic solution Place 1 drop into the left eye at bedtime.        . Multiple Vitamin (MULTIVITAMIN) capsule Take 1 capsule by mouth daily.        . potassium chloride SA (KLOR-CON M20) 20 MEQ tablet Take 1 tablet (20 mEq total) by mouth 2 (two) times daily.  180 tablet  3  . prochlorperazine (COMPAZINE) 10 MG tablet Take 1 tablet (10 mg total) by mouth every 6 (six) hours as needed.  60 tablet  0  . simvastatin (ZOCOR) 40 MG tablet TAKE ONE TABLET BY MOUTH   NIGHTLY AT BEDTIME  90 tablet  3  . Trolamine Salicylate (MYOFLEX EX) Apply 1 application topically 2 (two) times daily as needed. For pain  AS NEEDED      . vitamin C (ASCORBIC ACID) 500 MG tablet Take 500 mg by mouth daily.         No current facility-administered medications for this encounter.     ALLERGIES: Niacin   LABORATORY DATA:  Lab Results  Component Value Date   WBC 8.3 06/29/2013   HGB 10.1* 06/29/2013   HCT 29.9* 06/29/2013   MCV 90.1 06/29/2013   PLT 35* 06/29/2013   Lab Results  Component Value Date   NA 142 06/29/2013   K 3.4* 06/29/2013   CL 104 06/08/2013   CO2 27 06/29/2013   Lab Results  Component Value Date   ALT 25 06/29/2013   AST 19 06/29/2013   ALKPHOS 66 06/29/2013   BILITOT  0.44 06/29/2013     NARRATIVE: Cory [name] was seen today for weekly treatment management. The chart was checked and the patient's films were reviewed. The patient is doing well with his treatment. He has developed some back pain during treatment which has been somewhat of a chronic issue for him. This has resolved with a back brace. We discussed that this was not directly related to his treatment. Otherwise doing well. No shortness of breath.  PHYSICAL EXAMINATION: weight is 153 lb 14.4 oz (69.809 kg). His oral temperature is 97.7 F (36.5 C). His blood pressure is 123/71 and his pulse is 80. His respiration is 20 and oxygen saturation is 97%.        ASSESSMENT: The patient is doing satisfactorily with treatment.  PLAN: We will continue with the patient's radiation treatment as planned.

## 2013-06-30 NOTE — Progress Notes (Signed)
Weekly rad txs lung 11/35 completed pt denys coughs, sob, 97% room air sats, does c/o sore throat  Still, just started 1 week ago, feels foggy/confused at times, no back pain since he put on a back brace, patient talks about sciatica and back from 1.5 year ago, is fatigued, appetite fair and drinks fluids fair stated, slight erythema on chest, suggested to use the radiaplex gel, at least once daily after rad txs, patient hasn't been using 11:29 AM  11:27 AM

## 2013-07-01 ENCOUNTER — Ambulatory Visit
Admission: RE | Admit: 2013-07-01 | Discharge: 2013-07-01 | Disposition: A | Discharge status: Home / Self Care | Payer: Medicare Other | Source: Ambulatory Visit | Attending: Radiation Oncology | Admitting: Radiation Oncology

## 2013-07-01 NOTE — Addendum Note (Signed)
Encounter addended by: Jonna Coup, MD on: 07/01/2013  5:32 PM<BR>     Documentation filed: Notes Section

## 2013-07-02 ENCOUNTER — Ambulatory Visit
Admission: RE | Admit: 2013-07-02 | Discharge: 2013-07-02 | Disposition: A | Discharge status: Home / Self Care | Payer: Medicare Other | Source: Ambulatory Visit | Attending: Radiation Oncology | Admitting: Radiation Oncology

## 2013-07-02 ENCOUNTER — Encounter (HOSPITAL_COMMUNITY): Payer: Self-pay

## 2013-07-02 ENCOUNTER — Ambulatory Visit (HOSPITAL_COMMUNITY)
Admission: RE | Admit: 2013-07-02 | Discharge: 2013-07-02 | Disposition: A | Discharge status: Home / Self Care | Payer: Medicare Other | Source: Ambulatory Visit | Attending: Internal Medicine | Admitting: Internal Medicine

## 2013-07-02 DIAGNOSIS — N281 Cyst of kidney, acquired: Secondary | ICD-10-CM | POA: Insufficient documentation

## 2013-07-02 DIAGNOSIS — C3491 Malignant neoplasm of unspecified part of right bronchus or lung: Secondary | ICD-10-CM

## 2013-07-02 DIAGNOSIS — C3490 Malignant neoplasm of unspecified part of unspecified bronchus or lung: Secondary | ICD-10-CM | POA: Insufficient documentation

## 2013-07-02 DIAGNOSIS — Z9221 Personal history of antineoplastic chemotherapy: Secondary | ICD-10-CM | POA: Insufficient documentation

## 2013-07-02 DIAGNOSIS — R599 Enlarged lymph nodes, unspecified: Secondary | ICD-10-CM | POA: Insufficient documentation

## 2013-07-02 DIAGNOSIS — N323 Diverticulum of bladder: Secondary | ICD-10-CM | POA: Insufficient documentation

## 2013-07-02 MED ORDER — IOHEXOL 300 MG/ML  SOLN
100.0000 mL | Freq: Once | INTRAMUSCULAR | Status: AC | PRN
  Administered 2013-07-02: 100 mL via INTRAVENOUS

## 2013-07-05 ENCOUNTER — Ambulatory Visit (INDEPENDENT_AMBULATORY_CARE_PROVIDER_SITE_OTHER): Payer: Medicare Other | Admitting: Pulmonary Disease

## 2013-07-05 ENCOUNTER — Ambulatory Visit
Admission: RE | Admit: 2013-07-05 | Discharge: 2013-07-05 | Disposition: A | Discharge status: Home / Self Care | Payer: Medicare Other | Source: Ambulatory Visit | Attending: Radiation Oncology | Admitting: Radiation Oncology

## 2013-07-05 ENCOUNTER — Encounter: Payer: Self-pay | Admitting: Pulmonary Disease

## 2013-07-05 VITALS — BP 118/72 | HR 90 | Temp 97.7°F | Ht 69.0 in | Wt 151.8 lb

## 2013-07-05 DIAGNOSIS — I10 Essential (primary) hypertension: Secondary | ICD-10-CM

## 2013-07-05 DIAGNOSIS — C3490 Malignant neoplasm of unspecified part of unspecified bronchus or lung: Secondary | ICD-10-CM

## 2013-07-05 DIAGNOSIS — R220 Localized swelling, mass and lump, head: Secondary | ICD-10-CM

## 2013-07-05 DIAGNOSIS — R7309 Other abnormal glucose: Secondary | ICD-10-CM

## 2013-07-05 DIAGNOSIS — F17200 Nicotine dependence, unspecified, uncomplicated: Secondary | ICD-10-CM

## 2013-07-05 DIAGNOSIS — M1990 Unspecified osteoarthritis, unspecified site: Secondary | ICD-10-CM

## 2013-07-05 DIAGNOSIS — J449 Chronic obstructive pulmonary disease, unspecified: Secondary | ICD-10-CM

## 2013-07-05 DIAGNOSIS — Z87898 Personal history of other specified conditions: Secondary | ICD-10-CM

## 2013-07-05 DIAGNOSIS — J4489 Other specified chronic obstructive pulmonary disease: Secondary | ICD-10-CM

## 2013-07-05 DIAGNOSIS — C3491 Malignant neoplasm of unspecified part of right bronchus or lung: Secondary | ICD-10-CM

## 2013-07-05 DIAGNOSIS — E7800 Pure hypercholesterolemia, unspecified: Secondary | ICD-10-CM

## 2013-07-05 DIAGNOSIS — G4733 Obstructive sleep apnea (adult) (pediatric): Secondary | ICD-10-CM

## 2013-07-05 NOTE — Progress Notes (Signed)
Subjective:    Patient ID: Cory [name], male    DOB: 07/27/1929, 79 y.o.   MRN: [id number]  HPI 79 y/o WM here for a follow up visit... he has multiple medical problems as noted below...   ~  Apr 18, 2010:  remarkably stable & still smokes 1ppd, playing tennis regularly in a league & no c/o dyspnea, CP, etc...  BP controlled on meds;  due for f/u FLP on his Simva40- taking 1/2 tab daily;  BPH followed by DrKimbrough &he reports PSA's have been normal;  notes some DJD/ LBP & saw DrRendall 1 mo ago w/ Dosepak that resolved the issue... no new complaints or concerns x some "muscle spasm" in feet- offered Soma Rx but he declines new medications & will let us know.  ~  May 14, 2011:  Yearly ROV & he reports stable, doing well he says... Still smoking 1-1.5ppd w/ min cough/ phlegm/ dyspnea; plays tennis regularly w/o deterioration etc;   He notes some nocturnal leg cramps relieved by a banana & we discussed quinine vs mustard Qhs;  Due for f/u CXR & will ret for fasting blood work...  meds refilled for 90d supplies per request... CXR is OK today w/o lesions;  Labs are OK as well but K=3.4 & he's supposed to be on K20Bid already...   ~  June 16, 2012:  36mo ROV & Cory [name] has had a good yr overall> main prob was back pain "siatica" he says w/ trip to ER 7/12 & f/u DrBotero & DrRendall- we don't have any notes but he tells me that MRI done in the trailer behind Botero's office showed "siatica" & he has some weakness in right foot w/ foot drop- offered phys therapy but he declined & will pursue this thru church friend...    He brought a list of questions from wife> he stopped niacin after article in news saying it didn't help; he decided against lifeline screen this yr; he has some leg cramps on & off- using potassium & bananas which seem to help; he has itchy spots & uses witch hazel w/ relief "it helps dogs too"; using Lizard [employer]ice E-cig to help w/ smoking cessation & he reports down 40% so far...    BP  controlled on Amlodipine; needs to ret fasting for FLP on Simva20...    We reviewed prob list, meds, xrays and labs> see below>> CXR 7/13 showed essent norm heart size, clear lungs w/ some mild bibasilar scarring, DJD in spine... LABS 7/13:  FLP- ok x HDL=33 on Simva20;  Chems- ok w/ BS=101 A1c=6.3;  CBC- ok;  TSH=1.46;  PSA=0.33   ~  Apr 27, 2013:  82mo ROV & add-on appt requested for knot in the neck> he reports recent dental problems, infected tooth right maxilla where implant done in past, this has been manipulated by the dentist, on antibiotics, & plans surg next week (to remove the titanium post); notes a swelling/ knot in right mid neck x1wk- firm ?LN mid neck, denies f/c/s, denies hoarseness/ dysphagia, denies CP/SOB/ cough phlegm hemoptysis... We discussed need for screening labs, CT soft tissues of the neck w/ contrast & then further eval once this is known... LABS 5/14 showed BMet- normal w/ Cr=1.0;  CBC- wnl w/ Hg=14.5, WBC=10K;  Sed=15... CT Soft Tissues of Neck 5/15 showed right neck adenopathy & right paratracheal adenopathy displacing the SVC; incidental note made of a right thyroid lesion, atheromatous changes in great vessels, cervical spondylosis... WE WILL PROCEED w/ CXR &  CT CHEST w/ contrast => then decide on approach for Bx prior to Oncology & XRT referral... ADDENDUM >> CXR - wasn't done as pt forgot to go to [facility] Dept for the film!!! This needs to be completed when able... CT Chest w/ Contast 05/03/13 showed extensive mediastinal, right hilar, right cervical & right supraclav adenopathy; "patchy nodularity" in RUL w/o mass seen; note is made of atherosclerotic dis in vessels, right renal cyst (intermed attenuation)... BIOPSY by IR 05/06/13 of right supraclavicular node => POS for SMALL CELL CARCINOMA and we will arrange for cancer center referral, DrMohammed.  ~  July 05, 2013:  43mo ROV & Cindy is in the middle of his Chemotherapy & XRT for Small Cell Lung Cancer- under the  direction of DrMohammed & DrMoody- their notes are reviewed & discussed w/ pt & his wife;  He had f/u CT Chest 07/02/13 w/ improvement;  His appetite is fair & he has lost 8# down to 152# (we discussed protein supplements);  He has started to lose his hair;  He is still smoking- 2/3 ppd & has Wellbutrin to tryu (rec to him by [place] sharpless)He has mult somatic complaints today which is very unusual for him- itching, blurry left eye, lack of concentration, lightheaded, some dizziness, siatic nerve prob w/ tingling right foot & swelling...     We reviewed prob list, meds, xrays and labs> see below for updates >> we reviewed his med list today & offered to remain avail to him for support during this difficult time...          Problem List:  GLAUCOMA - on drops per DrCashwell...  Hx of OBSTRUCTIVE SLEEP APNEA (ICD-327.23) - hx mild OSA on sleep study 2001w/ RDI= 14 and desat to 85%... eval DrClance- rec weight loss & exerc, not on CPAP... he notes that he rests well & denies daytime hypersomnolence, snoring, etc...  COPD (ICD-496) & CIGARETTE SMOKER (ICD-305.1) - not currently on any meds... longtime smoker ~ 1-1.5 ppd w/ min cough/ phlegm/ DOE without change... he refuses smoking cessation help stating that he got depressed when he tried quitting in the past... baseline CXR without acute changes, and prev PFT's 2/05 showed FVC= 2.88 (75%), FEV1= 2.20 (72%), %1sec=76, mid-flows= 55% pred...  ~  CXR 5/11 showed atherosclerotic changes, sl incr markings, DJD sp, NAD. ~  CXR 5/12 showed clear lungs, NAD. ~  CXR 7/13 showed essent norm heart size, clear lungs w/ some mild bibasilar scarring, DJD in spine... ~  7/13: he is trying [employer] & states that he is down 40% currently... ~  Still smoking ~1ppd despite all efforts to get him to quit...  NEW 5/14 presentation w/ right neck adenopathy => Metastatic small cell lung cancer >>  ~  CT Chest 5/14 revealed extensive mediastinal, right hilar,  right cervical & right supraclav adenopathy; "patchy nodularity" in RUL w/o mass seen; note is made of atherosclerotic dis in vessels, right renal cyst (intermed attenuation) ~  BIOPSY by IR 05/06/13 of right supraclavicular node => POS for SMALL CELL CARCINOMA and we will arrange for cancer center referral, DrMohammed. ~  PET scan 6/14 w/ hypermetabolic bulky right paratracheal & supraclav adenopathy, hypermetabolic nodule in RUL & an area of hypermetabolism within the left lobe of the thyroid... ~  MRI Head 6/14 showed mod atrophy, extensive chr microvasc ischemic changes, no mets ~  6/14:  He was started on chemotherapy by DrMohammed & XRT by [facility]... ~  7/14:  Follow up CT  Chest showed marked reduction in volume of bulky mediastinal adenopathy, no evid of dis progression...  HYPERTENSION (ICD-401.9) - controlled on ASA 81mg /d, AMLODIPINE 5mg /d, & KCl Bid... ] ~  5/12:  BP= 122/70 not checking BP at home> denies HA, fatigue, visual changes, CP, palipit, dizziness, syncope, dyspnea, edema, etc; he plays tennis 2x/wk... ~  7/13:  BP= 138/78 & continues to deny CP, palpit, dizzy, SOB, edema, etc... ~  7/14:  BP= 118/72  VENOUS INSUFFICIENCY (ICD-459.81) - he follows a low sodium diet, elevates when able and wears support hose as needed.  HYPERCHOLESTEROLEMIA (ICD-272.0) - on SIMVASTATIN 40mg /d- taking 1/2 tab daily & he added Niacin 250mg /d, but has since stopped. ~  FLP 7/08 on Vytorin10-20 showed TChol 102, TG 94, HDL 28, LDL 55 ~  FLP 9/09 on Simvast40-1/2 showed TChol 126, TG 86, HDL 30, LDL 79... rec- same med. ~  FLP 5/11 on Simva40-1/2 showed TChol 134, TG 101, HDL 36, LDL 77 ~  FLP 5/12 on Simva40- 1/2 showed TChol 106, TG 92, HDL 40, LDL 48 ~  FLP 7/13 on Simva20 showed TChol 122, TG 96, HDL 33, LDL 70  DIABETES MELLITUS, BORDERLINE (ICD-790.29) - on diet alone... ~  labs 7/08 showed BS= 125, HgA1c= 6.5 ~  labs 9/09 showed BS= 133, HgA1c= 6.2 ~  labs 5/11 showed BS= 111, A1c=  6.4 ~  Labs 5/12 showed BS= 108 ~  Labs 7/13 showed BS= 101, A1c= 6.3  DIVERTICULOSIS OF COLON (ICD-562.10) & COLONIC POLYPS (ICD-211.3) -  ~  colonoscopy 9/04 showed divertics & several 4-97mm polyps= hyperpl and tubular adenomas... ~  f/u colonoscopy 10/09 by DrStark showed divertics, 3mm polyp= tubular adenoma, +hems...  BENIGN PROSTATIC HYPERTROPHY, HX OF (ICD-V13.8) - followed by DrKimbrough et al... last note from Urology= 11/09 w/ Dx= Renal cyst & Obsrtuctive Uropathy (mild BPH) & +FamHx prostate Ca in father... hx mild hematuria w/ cysto in 2007 showing a sm diverticulum off the left base of the bladder... ~  Labs 5/11 showed PSA= 0.41 ~  He tells me that DrKimbrough stopped doing PSAs & he's OK w/ this... ~  Labs 7/13 showed PSA= 0.33  DEGENERATIVE JOINT DISEASE (ICD-715.90) - uses OTC NSAIDs as needed... LBP & "Siatica" per DrBotero >> he had lumbar lam 1997 by DrRobinson ~  He notes LBP and went to ER 7/12> XRays showed postop & degen changes in lumbar spine w/ mild ant sublux L4 on L5; XRay Sacrum showed no displacement or fx; XRay of R Hip showed degen changes w/o fx etc;  He went to [facility] w/ MRI done (we don't have any records) & pt states it showed "siatica", given shots by [facility] w/ some benefit...  BACK PAIN, LUMBAR (ICD-724.2) - hx of HNP/ sp stenosis... s/p lumbar lam 1997 by DrRobinson... ~  5/11: he reports LBP ~80mo ago Rx by DrRendall w/ Dosepak & symptoms resolved.  Hx of LEG CRAMPS (ICD-729.82) - he uses KCl w/ relief (2 tabs daily) + Bananas... hx of rash from Quinine...  VITAMIN D DEFICIENCY - on Vit D 2000 u daily... ~  Labs 5/11 showed Vit D level = 18... rec to start 2000u OTC supplement daily. ~  Labs 5/12 showed Vit D level = 39... Continue daily supplement   SKIN CANCER, HX OF (ICD-V10.83) - s/p basal cell on scalp, Dr Donzetta Starch...  Hx of SHINGLES - right T4 distrib shingles in 2000...   Past Surgical History  Procedure Laterality Date  .  Appendectomy    .  Tonsillectomy and adenoidectomy    . Left inguinal hernia repair  1998    Dr. Earlene Plater  . Lumbar laminectomy  1997    Dr. Roxan Hockey  . Lymph node biopsy  05/06/2013    Outpatient Encounter Prescriptions as of 07/05/2013  Medication Sig Dispense Refill  . amLODipine (NORVASC) 5 MG tablet TAKE ONE TABLET BY MOUTH ONE TIME DAILY  90 tablet  0  . aspirin 81 MG tablet Take 81 mg by mouth daily.        . Cetirizine HCl (ZYRTEC ALLERGY) 10 MG CAPS Take 1 capsule by mouth.      . Cholecalciferol (VITAMIN D) 2000 UNITS CAPS Take 1 capsule by mouth daily.        . hyaluronate sodium (RADIAPLEXRX) GEL Apply topically 2 (two) times daily.      Marland Kitchen latanoprost (XALATAN) 0.005 % ophthalmic solution Place 1 drop into the left eye at bedtime.        . Multiple Vitamin (MULTIVITAMIN) capsule Take 1 capsule by mouth daily.        . potassium chloride SA (KLOR-CON M20) 20 MEQ tablet Take 1 tablet (20 mEq total) by mouth 2 (two) times daily.  180 tablet  3  . prochlorperazine (COMPAZINE) 10 MG tablet Take 1 tablet (10 mg total) by mouth every 6 (six) hours as needed.  60 tablet  0  . simvastatin (ZOCOR) 40 MG tablet TAKE ONE TABLET BY MOUTH   NIGHTLY AT BEDTIME  90 tablet  3  . Trolamine Salicylate (MYOFLEX EX) Apply 1 application topically 2 (two) times daily as needed. For pain  AS NEEDED      . vitamin C (ASCORBIC ACID) 500 MG tablet Take 500 mg by mouth daily.         No facility-administered encounter medications on file as of 07/05/2013.    Allergies  Allergen Reactions  . Niacin     REACTION: Intol to Niacin w/ flushing in large doses    Current Medications, Allergies, Past Medical History, Past Surgical History, Family History, and Social History were reviewed in [employer] record.   Review of Systems       See HPI - all other systems neg except as noted... The patient notes intermittent back pain;  He denies anorexia, fever, weight loss, weight gain,  vision loss, decreased hearing, hoarseness, chest pain, syncope, dyspnea on exertion, peripheral edema, prolonged cough, headaches, hemoptysis, abdominal pain, melena, hematochezia, severe indigestion/heartburn, hematuria, incontinence, muscle weakness, suspicious skin lesions, transient blindness, difficulty walking, depression, unusual weight change, abnormal bleeding, enlarged lymph nodes, and angioedema.     Objective:   Physical Exam     WD, WN, 79 y/o WM in NAD...  GENERAL:  Alert & oriented; pleasant & cooperative... HEENT:  [place]/AT, EOM-wnl, PERRLA, EACs-clear, TMs-wnl, NOSE-clear, THROAT- sl red w/o exud, +dental issues w/ titanium post exposure right maxilla... NECK:  Supple w/ fairROM; no JVD; normal carotid impulses w/o bruits; no thyromegaly, +right mid neck adenopathy- non tender CHEST:  Clear to P & A; without wheezes/ rales/ or rhonchi heard... HEART:  Regular Rhythm; without murmurs/ rubs/ or gallops detected... ABDOMEN:  Soft & nontender; normal bowel sounds; no organomegaly or masses palpated... He has a "touchey" abd and is difficult to examine... (RECTAL:  Neg - prostate 3+ & nontender w/o nodules; stool hematest neg) EXT: without deformities, mild arthritic changes; no varicose veins/ venous insuffic/ or edema. NEURO:  CN's intact; motor testing normal; sensory testing normal; gait normal &  balance OK. DERM:  No lesions noted; no rash etc...  RADIOLOGY DATA:  Reviewed in the EPIC EMR & discussed w/ the patient...  LABORATORY DATA:  Reviewed in the EPIC EMR & discussed w/ the patient...   Assessment & Plan:    SMALL CELL LUNG CANCER >> he is currently getting Chemotherapy from DrMohammed, and XRT from [facility]...   COPD/ Cig Smoker> he is asymptomatic but still smoking now <1ppd using e-cig, not interestred in other smoking cessation help etc...  HBP>  Controlled on meds, continue same + low sodium etc...  CHOL>  Looks great on diet + Simva20...  DM>  Remains  diet controlled...  GI>  Followed by DrStark & up to date...  DJD> he is more sedentary due to the chemo- no arthritic complaints at present...  Leg Cramps>  He uses the KCl + bananas etc...  Other medical issues as noted...   Patient's Medications  New Prescriptions   ALUM & MAG HYDROXIDE-SIMETH (MAGIC MOUTHWASH W/LIDOCAINE) SOLN    Take 5 mLs by mouth 4 (four) times daily as needed.   FLUCONAZOLE (DIFLUCAN) 100 MG TABLET    Take 2 tabs today, then 1 tab daily for 20 more days for thrush   HYDROCODONE-ACETAMINOPHEN (HYCET) 7.5-325 MG/15 ML SOLUTION    Take 15 mLs by mouth 4 (four) times daily as needed for pain.  Previous Medications   ACETAMINOPHEN (TYLENOL) 500 MG TABLET    Take 500 mg by mouth every 6 (six) hours as needed for pain.   AMLODIPINE (NORVASC) 5 MG TABLET    TAKE ONE TABLET BY MOUTH ONE TIME DAILY   ANTISEPTIC ORAL RINSE (BIOTENE) LIQD    15 mLs by Mouth Rinse route as needed.   ASPIRIN 81 MG TABLET    Take 81 mg by mouth daily.     CETIRIZINE HCL (ZYRTEC ALLERGY) 10 MG CAPS    Take 1 capsule by mouth.   CHOLECALCIFEROL (VITAMIN D) 2000 UNITS CAPS    Take 1 capsule by mouth daily.    EMOLLIENT (BIAFINE) CREAM    Apply topically 2 (two) times daily.   GUAIFENESIN (MUCINEX) 600 MG 12 HR TABLET    Take 1,200 mg by mouth daily.   HYALURONATE SODIUM (RADIAPLEXRX) GEL    Apply topically 2 (two) times daily.   LATANOPROST (XALATAN) 0.005 % OPHTHALMIC SOLUTION    Place 1 drop into the left eye at bedtime.     MULTIPLE VITAMIN (MULTIVITAMIN) CAPSULE    Take 1 capsule by mouth daily.     POTASSIUM CHLORIDE SA (KLOR-CON M20) 20 MEQ TABLET    Take 1 tablet (20 mEq total) by mouth 2 (two) times daily.   PROCHLORPERAZINE (COMPAZINE) 10 MG TABLET    Take 1 tablet (10 mg total) by mouth every 6 (six) hours as needed.   SIMVASTATIN (ZOCOR) 40 MG TABLET       SUCRALFATE (CARAFATE) 1 GM/10ML SUSPENSION    Take 1 g by mouth 4 (four) times daily.   TROLAMINE SALICYLATE (MYOFLEX EX)     Apply 1 application topically 2 (two) times daily as needed. For pain  AS NEEDED  Modified Medications   Modified Medication Previous Medication   FENTANYL (DURAGESIC - DOSED MCG/HR) 12 MCG/HR fentaNYL (DURAGESIC - DOSED MCG/HR) 12 MCG/HR      Place 1 patch (12.5 mcg total) onto the skin every 3 (three) days.    Place 1 patch (12.5 mcg total) onto the skin every 3 (three) days.  Discontinued Medications  SIMVASTATIN (ZOCOR) 40 MG TABLET    TAKE ONE TABLET BY MOUTH   NIGHTLY AT BEDTIME   VITAMIN C (ASCORBIC ACID) 500 MG TABLET    Take 500 mg by mouth daily.

## 2013-07-05 NOTE — Patient Instructions (Addendum)
Today we updated your med list in our EPIC system...    Continue your current medications the same...  Continue your nutritional supplements but avoid sugars and sweets...  Call for any questions or if we can be of service in any way.Marland KitchenMarland Kitchen

## 2013-07-06 ENCOUNTER — Ambulatory Visit (HOSPITAL_COMMUNITY): Payer: Medicare Other

## 2013-07-06 ENCOUNTER — Other Ambulatory Visit (HOSPITAL_BASED_OUTPATIENT_CLINIC_OR_DEPARTMENT_OTHER): Payer: Medicare Other | Admitting: Lab

## 2013-07-06 ENCOUNTER — Ambulatory Visit
Admission: RE | Admit: 2013-07-06 | Discharge: 2013-07-06 | Disposition: A | Discharge status: Home / Self Care | Payer: Medicare Other | Source: Ambulatory Visit | Attending: Radiation Oncology | Admitting: Radiation Oncology

## 2013-07-06 ENCOUNTER — Ambulatory Visit (HOSPITAL_BASED_OUTPATIENT_CLINIC_OR_DEPARTMENT_OTHER): Payer: Medicare Other

## 2013-07-06 ENCOUNTER — Encounter: Payer: Self-pay | Admitting: Internal Medicine

## 2013-07-06 ENCOUNTER — Ambulatory Visit (HOSPITAL_BASED_OUTPATIENT_CLINIC_OR_DEPARTMENT_OTHER): Payer: Medicare Other | Admitting: Internal Medicine

## 2013-07-06 VITALS — BP 122/66 | HR 86 | Temp 98.5°F | Resp 18 | Ht 69.0 in | Wt 152.3 lb

## 2013-07-06 DIAGNOSIS — C3410 Malignant neoplasm of upper lobe, unspecified bronchus or lung: Secondary | ICD-10-CM

## 2013-07-06 DIAGNOSIS — C3491 Malignant neoplasm of unspecified part of right bronchus or lung: Secondary | ICD-10-CM

## 2013-07-06 DIAGNOSIS — Z5111 Encounter for antineoplastic chemotherapy: Secondary | ICD-10-CM

## 2013-07-06 LAB — COMPREHENSIVE METABOLIC PANEL (CC13)
ALT: 19 U/L (ref 0–55)
BUN: 21.2 mg/dL (ref 7.0–26.0)
CO2: 26 mEq/L (ref 22–29)
Calcium: 9.4 mg/dL (ref 8.4–10.4)
Chloride: 108 mEq/L (ref 98–109)
Creatinine: 1 mg/dL (ref 0.7–1.3)
Glucose: 87 mg/dl (ref 70–140)
Total Bilirubin: 0.67 mg/dL (ref 0.20–1.20)

## 2013-07-06 LAB — CBC WITH DIFFERENTIAL/PLATELET
Basophils Absolute: 0 10*3/uL (ref 0.0–0.1)
Eosinophils Absolute: 0 10*3/uL (ref 0.0–0.5)
HCT: 30 % — ABNORMAL LOW (ref 38.4–49.9)
HGB: 10 g/dL — ABNORMAL LOW (ref 13.0–17.1)
LYMPH%: 4.8 % — ABNORMAL LOW (ref 14.0–49.0)
MCV: 90.4 fL (ref 79.3–98.0)
MONO#: 1.2 10*3/uL — ABNORMAL HIGH (ref 0.1–0.9)
MONO%: 11.5 % (ref 0.0–14.0)
NEUT#: 8.9 10*3/uL — ABNORMAL HIGH (ref 1.5–6.5)
NEUT%: 83.2 % — ABNORMAL HIGH (ref 39.0–75.0)
Platelets: 220 10*3/uL (ref 140–400)
WBC: 10.7 10*3/uL — ABNORMAL HIGH (ref 4.0–10.3)
nRBC: 0 % (ref 0–0)

## 2013-07-06 MED ORDER — SODIUM CHLORIDE 0.9 % IV SOLN
Freq: Once | INTRAVENOUS | Status: AC
  Administered 2013-07-06: 13:00:00 via INTRAVENOUS

## 2013-07-06 MED ORDER — ONDANSETRON 16 MG/50ML IVPB (CHCC)
16.0000 mg | Freq: Once | INTRAVENOUS | Status: AC
  Administered 2013-07-06: 16 mg via INTRAVENOUS

## 2013-07-06 MED ORDER — SODIUM CHLORIDE 0.9 % IV SOLN
120.0000 mg/m2 | Freq: Once | INTRAVENOUS | Status: AC
  Administered 2013-07-06: 230 mg via INTRAVENOUS
  Filled 2013-07-06: qty 11.5

## 2013-07-06 MED ORDER — DEXAMETHASONE SODIUM PHOSPHATE 20 MG/5ML IJ SOLN
20.0000 mg | Freq: Once | INTRAMUSCULAR | Status: AC
  Administered 2013-07-06: 20 mg via INTRAVENOUS

## 2013-07-06 MED ORDER — SODIUM CHLORIDE 0.9 % IV SOLN
419.5000 mg | Freq: Once | INTRAVENOUS | Status: AC
  Administered 2013-07-06: 420 mg via INTRAVENOUS
  Filled 2013-07-06: qty 42

## 2013-07-06 NOTE — Progress Notes (Signed)
[facility] [phone]   [phone]  OFFICE PROGRESS NOTE  Cory Mcalpine, MD [address] [place] [address]  DIAGNOSIS: Limited stage small cell lung cancer diagnosed in May of 2014.   PRIOR THERAPY: None.   CURRENT THERAPY: Systemic chemotherapy with carboplatin for AUC of 5 on day 1 and etoposide 120 mg/M2 on days 1, 2 and 3 with Neulasta support on day 4 every 3 weeks. Status post 2 cycles, concurrent with radiotherapy. First cycle was on 05/25/13.  INTERVAL HISTORY: Cory [name] 79 y.o. male returns to the clinic today for followup visit accompanied by his wife. The patient is complaining of increasing fatigue and weakness. He tolerated the second cycle of his systemic chemotherapy fairly well with no significant adverse effects. Has some mild sore throat and cough started after radiation treatment. He denied having any significant chest pain, shortness breath or hemoptysis. The patient denied having any significant weight loss or night sweats. He had repeat CT scan of the chest performed recently and he is here for evaluation and discussion of his scan results.  MEDICAL HISTORY: Past Medical History  Diagnosis Date  . OSA (obstructive sleep apnea)   . COPD (chronic obstructive pulmonary disease)   . Cigarette smoker   . Hypertension   . Venous insufficiency   . Hypercholesteremia   . Borderline diabetes mellitus   . Diverticulosis of colon   . Hx of colonic polyps   . BPH (benign prostatic hyperplasia)   . DJD (degenerative joint disease)   . Lumbar back pain   . Leg cramps   . History of skin cancer   . History of shingles   . History of chemotherapy 05/25/2013    carboplatin, etoposide, neulasta every 3 weeks     ALLERGIES:  is allergic to niacin.  MEDICATIONS:  Current Outpatient Prescriptions  Medication Sig Dispense Refill  . amLODipine (NORVASC) 5 MG tablet TAKE ONE TABLET BY MOUTH ONE TIME DAILY  90 tablet  0  .  aspirin 81 MG tablet Take 81 mg by mouth daily.        . Cholecalciferol (VITAMIN D) 2000 UNITS CAPS Take 1 capsule by mouth daily.       . hyaluronate sodium (RADIAPLEXRX) GEL Apply topically 2 (two) times daily.      Marland Kitchen latanoprost (XALATAN) 0.005 % ophthalmic solution Place 1 drop into the left eye at bedtime.        . Multiple Vitamin (MULTIVITAMIN) capsule Take 1 capsule by mouth daily.        . potassium chloride SA (KLOR-CON M20) 20 MEQ tablet Take 1 tablet (20 mEq total) by mouth 2 (two) times daily.  180 tablet  3  . prochlorperazine (COMPAZINE) 10 MG tablet Take 1 tablet (10 mg total) by mouth every 6 (six) hours as needed.  60 tablet  0  . simvastatin (ZOCOR) 40 MG tablet TAKE ONE TABLET BY MOUTH   NIGHTLY AT BEDTIME  90 tablet  3  . vitamin C (ASCORBIC ACID) 500 MG tablet Take 500 mg by mouth daily.        . Cetirizine HCl (ZYRTEC ALLERGY) 10 MG CAPS Take 1 capsule by mouth.      Terrance Mass Salicylate (MYOFLEX EX) Apply 1 application topically 2 (two) times daily as needed. For pain  AS NEEDED       No current facility-administered medications for this visit.    SURGICAL HISTORY:  Past Surgical  History  Procedure Laterality Date  . Appendectomy    . Tonsillectomy and adenoidectomy    . Left inguinal hernia repair  1998    Dr. Earlene Plater  . Lumbar laminectomy  1997    Dr. Roxan Hockey  . Lymph node biopsy  05/06/2013    REVIEW OF SYSTEMS:  A comprehensive review of systems was negative except for: Constitutional: positive for fatigue Ears, nose, mouth, throat, and face: positive for sore throat Respiratory: positive for cough   PHYSICAL EXAMINATION: General appearance: alert, cooperative, fatigued and no distress Head: Normocephalic, without obvious abnormality, atraumatic Neck: no adenopathy Lymph nodes: Cervical, supraclavicular, and axillary nodes normal. Resp: clear to auscultation bilaterally Cardio: regular rate and rhythm, S1, S2 normal, no murmur, click, rub or  gallop GI: soft, non-tender; bowel sounds normal; no masses,  no organomegaly Extremities: extremities normal, atraumatic, no cyanosis or edema Neurologic: Alert and oriented X 3, normal strength and tone. Normal symmetric reflexes. Normal coordination and gait  ECOG PERFORMANCE STATUS: 1 - Symptomatic but completely ambulatory  Blood pressure 122/66, pulse 86, temperature 98.5 F (36.9 C), temperature source Oral, resp. rate 18, height 5\' 9"  (1.753 m), weight 152 lb 4.8 oz (69.083 kg).  LABORATORY DATA: Lab Results  Component Value Date   WBC 10.7* 07/06/2013   HGB 10.0* 07/06/2013   HCT 30.0* 07/06/2013   MCV 90.4 07/06/2013   PLT 220 07/06/2013      Chemistry      Component Value Date/Time   NA 142 06/29/2013 0836   NA 140 04/27/2013 1631   K 3.4* 06/29/2013 0836   K 3.5 04/27/2013 1631   CL 104 06/08/2013 1426   CL 102 04/27/2013 1631   CO2 27 06/29/2013 0836   CO2 32 04/27/2013 1631   BUN 12.4 06/29/2013 0836   BUN 14 04/27/2013 1631   CREATININE 1.0 06/29/2013 0836   CREATININE 1.0 04/27/2013 1631      Component Value Date/Time   CALCIUM 8.7 06/29/2013 0836   CALCIUM 9.0 04/27/2013 1631   ALKPHOS 66 06/29/2013 0836   ALKPHOS 42 06/22/2012 0758   AST 19 06/29/2013 0836   AST 24 06/22/2012 0758   ALT 25 06/29/2013 0836   ALT 21 06/22/2012 0758   BILITOT 0.44 06/29/2013 0836   BILITOT 2.0* 06/22/2012 0758       RADIOGRAPHIC STUDIES: Ct Chest W Contrast  07/02/2013   *RADIOLOGY REPORT*  Clinical Data:  Small cell lung cancer.  Patient status post chemotherapy  CT CHEST, ABDOMEN AND PELVIS WITH CONTRAST  Technique:  Multidetector CT imaging of the chest, abdomen and pelvis was performed following the standard protocol during bolus administration of intravenous contrast.  Contrast: OMNIPAQUE IOHEXOL 300 MG/ML  SOLN  Comparison:  PET CT scan 05/24/2013, CT chest 05/03/2013    CT CHEST  Findings:  Compared to the CT chest of 05/03/2013, the large right paratracheal mass is decreased in  size measuring 29 x 25 mm compared to 60 x 40 mm.  Likewise a right upper paratracheal node is decreased measuring 8 mm compared to 24 mm.  The subcarinal and right hilar nodes are also decreased in size and are not pathologic by size criteria.  Review of the lung parenchyma demonstrates a small nodule in the right middle lobe measuring 4 mm unchanged from prior.  The branching nodular disease in the right upper lobe described on prior exam has near completely resolved.  Trace nodularity remaining.  IMPRESSION:  1.  Marked reduction in volume of  bulky mediastinal lymphadenopathy. 2.  Reduction in branching nodularity within the right upper lobe. 3.  No evidence of disease progression.    CT ABDOMEN AND PELVIS  Findings:  No focal hepatic lesion.  The gallbladder, pancreas, spleen, adrenal glands, and kidneys are unchanged.  Bilateral nonenhancing renal cysts.  The stomach, small bowel and, and colon are unremarkable. Abdominal aorta normal caliber.  No retroperitoneal lymphadenopathy.  There are small sub centimeter periaortic lymph nodes which not changed from prior.  The prostate gland bladder normal.  Small bladder diverticulum on the left side of bladder.  No pelvic lymphadenopathy. Review of bone windows demonstrates no aggressive osseous lesions.  IMPRESSION:   No evidence metastasis in the abdomen or pelvis.   Original Report Authenticated By: Genevive Bi, M.D.   ASSESSMENT AND PLAN: This is a very pleasant 79 years old white male with limited stage small cell lung cancer currently undergoing systemic chemotherapy with carboplatin and etoposide concurrent with radiation status post 2 cycles of chemotherapy. The patient has significant improvement in his disease. I discussed the scan results and showed the images to the patient and his wife. I recommended for him to proceed with cycle #3 today as scheduled. He would come back for followup visit in 3 weeks with the next cycle of chemotherapy. The  patient was advised to call immediately if he has any concerning symptoms in the interval. All questions were answered. The patient knows to call the clinic with any problems, questions or concerns. We can certainly see the patient much sooner if necessary.  I spent 15 minutes counseling the patient face to face. The total time spent in the appointment was 25 minutes.

## 2013-07-06 NOTE — Patient Instructions (Signed)
Scan showed significant improvement in her disease.  Continue chemotherapy with carboplatin and etoposide.  Followup visit in 3 weeks.

## 2013-07-06 NOTE — Patient Instructions (Signed)
[facility] Discharge Instructions for Patients Receiving Chemotherapy  Today you received the following chemotherapy agents :  Carboplatin, Etoposide.  To help prevent nausea and vomiting after your treatment, we encourage you to take your nausea medication as instructed by your physician.  Take Compazine 10 mg every 6 hrs as needed for nausea.  DO NOT Drive after taking Compazine as it can cause drowsiness.  DO Drink lots of fluids as tolerated.  DO NOT Eat greasy nor spicy foods.   If you develop nausea and vomiting that is not controlled by your nausea medication, call the clinic.   BELOW ARE SYMPTOMS THAT SHOULD BE REPORTED IMMEDIATELY:  *FEVER GREATER THAN 100.5 F  *CHILLS WITH OR WITHOUT FEVER  NAUSEA AND VOMITING THAT IS NOT CONTROLLED WITH YOUR NAUSEA MEDICATION  *UNUSUAL SHORTNESS OF BREATH  *UNUSUAL BRUISING OR BLEEDING  TENDERNESS IN MOUTH AND THROAT WITH OR WITHOUT PRESENCE OF ULCERS  *URINARY PROBLEMS  *BOWEL PROBLEMS  UNUSUAL RASH Items with * indicate a potential emergency and should be followed up as soon as possible.  Feel free to call the clinic you have any questions or concerns. The clinic phone number is [phone].

## 2013-07-07 ENCOUNTER — Telehealth: Payer: Self-pay | Admitting: Internal Medicine

## 2013-07-07 ENCOUNTER — Ambulatory Visit
Admission: RE | Admit: 2013-07-07 | Discharge: 2013-07-07 | Disposition: A | Discharge status: Home / Self Care | Payer: Medicare Other | Source: Ambulatory Visit | Attending: Radiation Oncology | Admitting: Radiation Oncology

## 2013-07-07 ENCOUNTER — Ambulatory Visit (HOSPITAL_BASED_OUTPATIENT_CLINIC_OR_DEPARTMENT_OTHER): Payer: Medicare Other

## 2013-07-07 ENCOUNTER — Telehealth: Payer: Self-pay | Admitting: *Deleted

## 2013-07-07 DIAGNOSIS — Z5111 Encounter for antineoplastic chemotherapy: Secondary | ICD-10-CM

## 2013-07-07 DIAGNOSIS — C3410 Malignant neoplasm of upper lobe, unspecified bronchus or lung: Secondary | ICD-10-CM

## 2013-07-07 MED ORDER — DEXAMETHASONE SODIUM PHOSPHATE 10 MG/ML IJ SOLN
10.0000 mg | Freq: Once | INTRAMUSCULAR | Status: AC
  Administered 2013-07-07: 10 mg via INTRAVENOUS

## 2013-07-07 MED ORDER — SODIUM CHLORIDE 0.9 % IV SOLN
Freq: Once | INTRAVENOUS | Status: AC
  Administered 2013-07-07: 11:00:00 via INTRAVENOUS

## 2013-07-07 MED ORDER — SODIUM CHLORIDE 0.9 % IV SOLN
120.0000 mg/m2 | Freq: Once | INTRAVENOUS | Status: AC
  Administered 2013-07-07: 230 mg via INTRAVENOUS
  Filled 2013-07-07: qty 11.5

## 2013-07-07 MED ORDER — ONDANSETRON 8 MG/50ML IVPB (CHCC)
8.0000 mg | Freq: Once | INTRAVENOUS | Status: AC
  Administered 2013-07-07: 8 mg via INTRAVENOUS

## 2013-07-07 NOTE — Telephone Encounter (Signed)
Per staff message and POF I have scheduled appts.  Cory [name]  

## 2013-07-07 NOTE — Patient Instructions (Addendum)
[facility] Discharge Instructions for Patients Receiving Chemotherapy  Today you received the following chemotherapy agents:  Etoposide  To help prevent nausea and vomiting after your treatment, we encourage you to take your nausea medication as ordered per MD.   If you develop nausea and vomiting that is not controlled by your nausea medication, call the clinic.   BELOW ARE SYMPTOMS THAT SHOULD BE REPORTED IMMEDIATELY:  *FEVER GREATER THAN 100.5 F  *CHILLS WITH OR WITHOUT FEVER  NAUSEA AND VOMITING THAT IS NOT CONTROLLED WITH YOUR NAUSEA MEDICATION  *UNUSUAL SHORTNESS OF BREATH  *UNUSUAL BRUISING OR BLEEDING  TENDERNESS IN MOUTH AND THROAT WITH OR WITHOUT PRESENCE OF ULCERS  *URINARY PROBLEMS  *BOWEL PROBLEMS  UNUSUAL RASH Items with * indicate a potential emergency and should be followed up as soon as possible.  Feel free to call the clinic you have any questions or concerns. The clinic phone number is [phone].    

## 2013-07-07 NOTE — Telephone Encounter (Signed)
gv and printed avs for pt....MW added tx.... °

## 2013-07-08 ENCOUNTER — Ambulatory Visit
Admission: RE | Admit: 2013-07-08 | Discharge: 2013-07-08 | Disposition: A | Discharge status: Home / Self Care | Payer: Medicare Other | Source: Ambulatory Visit | Attending: Radiation Oncology | Admitting: Radiation Oncology

## 2013-07-08 ENCOUNTER — Ambulatory Visit (HOSPITAL_BASED_OUTPATIENT_CLINIC_OR_DEPARTMENT_OTHER): Payer: Medicare Other

## 2013-07-08 DIAGNOSIS — C3410 Malignant neoplasm of upper lobe, unspecified bronchus or lung: Secondary | ICD-10-CM

## 2013-07-08 DIAGNOSIS — Z5111 Encounter for antineoplastic chemotherapy: Secondary | ICD-10-CM

## 2013-07-08 MED ORDER — ONDANSETRON 8 MG/50ML IVPB (CHCC)
8.0000 mg | Freq: Once | INTRAVENOUS | Status: AC
  Administered 2013-07-08: 8 mg via INTRAVENOUS

## 2013-07-08 MED ORDER — DEXAMETHASONE SODIUM PHOSPHATE 10 MG/ML IJ SOLN
10.0000 mg | Freq: Once | INTRAMUSCULAR | Status: AC
  Administered 2013-07-08: 10 mg via INTRAVENOUS

## 2013-07-08 MED ORDER — SODIUM CHLORIDE 0.9 % IV SOLN
Freq: Once | INTRAVENOUS | Status: AC
  Administered 2013-07-08: 12:00:00 via INTRAVENOUS

## 2013-07-08 MED ORDER — SODIUM CHLORIDE 0.9 % IV SOLN
120.0000 mg/m2 | Freq: Once | INTRAVENOUS | Status: AC
  Administered 2013-07-08: 230 mg via INTRAVENOUS
  Filled 2013-07-08: qty 11.5

## 2013-07-09 ENCOUNTER — Ambulatory Visit
Admission: RE | Admit: 2013-07-09 | Discharge: 2013-07-09 | Disposition: A | Discharge status: Home / Self Care | Payer: Medicare Other | Source: Ambulatory Visit | Attending: Radiation Oncology | Admitting: Radiation Oncology

## 2013-07-09 ENCOUNTER — Other Ambulatory Visit: Payer: Self-pay | Admitting: Hematology & Oncology

## 2013-07-09 ENCOUNTER — Ambulatory Visit (HOSPITAL_BASED_OUTPATIENT_CLINIC_OR_DEPARTMENT_OTHER): Payer: Medicare Other

## 2013-07-09 VITALS — BP 147/97 | HR 80 | Temp 98.7°F | Ht 69.0 in | Wt 156.6 lb

## 2013-07-09 DIAGNOSIS — C3490 Malignant neoplasm of unspecified part of unspecified bronchus or lung: Secondary | ICD-10-CM

## 2013-07-09 DIAGNOSIS — Z5189 Encounter for other specified aftercare: Secondary | ICD-10-CM

## 2013-07-09 DIAGNOSIS — C3410 Malignant neoplasm of upper lobe, unspecified bronchus or lung: Secondary | ICD-10-CM | POA: Insufficient documentation

## 2013-07-09 MED ORDER — PEGFILGRASTIM INJECTION 6 MG/0.6ML
6.0000 mg | Freq: Once | SUBCUTANEOUS | Status: AC
  Administered 2013-07-09: 6 mg via SUBCUTANEOUS
  Filled 2013-07-09: qty 0.6

## 2013-07-09 MED ORDER — MAGIC MOUTHWASH W/LIDOCAINE: 5.0000 mL | Freq: Four times a day (QID) | ORAL | Status: AC | PRN

## 2013-07-09 NOTE — Progress Notes (Signed)
Cory [name] here for weekly under treat visit.  He has had 18 fractions to his lung.  He does have a sore spot in his throat when he swallows pills.  He is fatigued.  He does have a cough but is not bringing anything up.  He denies shortness of breath and nausea.  He does have peeling of his skin on both sides of his neck.  He states that his neck and ears are itchy with the right worse than the left.  The skin on his chest is red.   He is using radiaplex.

## 2013-07-09 NOTE — Progress Notes (Signed)
Department of Radiation Oncology  Phone:  [phone] Fax:        [phone]  Weekly Treatment Note    Name: [name] Date: 07/09/2013 MRN: [id number] DOB: 09/03/1929   Current dose: 32.4 Gy  Current fraction: 18   MEDICATIONS: Current Outpatient Prescriptions  Medication Sig Dispense Refill  . amLODipine (NORVASC) 5 MG tablet TAKE ONE TABLET BY MOUTH ONE TIME DAILY  90 tablet  0  . aspirin 81 MG tablet Take 81 mg by mouth daily.        . Cetirizine HCl (ZYRTEC ALLERGY) 10 MG CAPS Take 1 capsule by mouth.      . Cholecalciferol (VITAMIN D) 2000 UNITS CAPS Take 1 capsule by mouth daily.       . hyaluronate sodium (RADIAPLEXRX) GEL Apply topically 2 (two) times daily.      Marland Kitchen latanoprost (XALATAN) 0.005 % ophthalmic solution Place 1 drop into the left eye at bedtime.        . Multiple Vitamin (MULTIVITAMIN) capsule Take 1 capsule by mouth daily.        . potassium chloride SA (KLOR-CON M20) 20 MEQ tablet Take 1 tablet (20 mEq total) by mouth 2 (two) times daily.  180 tablet  3  . prochlorperazine (COMPAZINE) 10 MG tablet Take 1 tablet (10 mg total) by mouth every 6 (six) hours as needed.  60 tablet  0  . simvastatin (ZOCOR) 40 MG tablet TAKE ONE TABLET BY MOUTH   NIGHTLY AT BEDTIME  90 tablet  3  . Trolamine Salicylate (MYOFLEX EX) Apply 1 application topically 2 (two) times daily as needed. For pain  AS NEEDED      . vitamin C (ASCORBIC ACID) 500 MG tablet Take 500 mg by mouth daily.         No current facility-administered medications for this encounter.     ALLERGIES: Niacin   LABORATORY DATA:  Lab Results  Component Value Date   WBC 10.7* 07/06/2013   HGB 10.0* 07/06/2013   HCT 30.0* 07/06/2013   MCV 90.4 07/06/2013   PLT 220 07/06/2013   Lab Results  Component Value Date   NA 143 07/06/2013   K 4.3 07/06/2013   CL 104 06/08/2013   CO2 26 07/06/2013   Lab Results  Component Value Date   ALT 19 07/06/2013   AST 19 07/06/2013   ALKPHOS 67 07/06/2013   BILITOT  0.67 07/06/2013     NARRATIVE: Cory [name] was seen today for weekly treatment management. The chart was checked and the patient's films were reviewed. The patient is doing quite well at this time. He does note a Radford bit of soreness of throat but this has not been a major issue for him. He is using a rents that she gargles which helps in terms of moist and in the throat. He has some fatigue. No difficulty in terms of shortness of breath.  PHYSICAL EXAMINATION: height is 5\' 9"  (1.753 m) and weight is 156 lb 9.6 oz (71.033 kg). His temperature is 98.7 F (37.1 C). His blood pressure is 147/97 and his pulse is 80. His oxygen saturation is 100%.        ASSESSMENT: The patient is doing satisfactorily with treatment.  PLAN: We will continue with the patient's radiation treatment as planned. The patient is doing well. I discussed the possible use of Magic mouthwash and I will call him in a prescription for this. This will be available if needed.

## 2013-07-12 ENCOUNTER — Other Ambulatory Visit: Payer: Self-pay | Admitting: *Deleted

## 2013-07-12 ENCOUNTER — Telehealth: Payer: Self-pay | Admitting: *Deleted

## 2013-07-12 ENCOUNTER — Ambulatory Visit
Admission: RE | Admit: 2013-07-12 | Discharge: 2013-07-12 | Disposition: A | Discharge status: Home / Self Care | Payer: Medicare Other | Source: Ambulatory Visit | Attending: Radiation Oncology | Admitting: Radiation Oncology

## 2013-07-12 NOTE — Telephone Encounter (Signed)
TALKED WITH PT. CONCERNING HIP PAIN FROM THE NEULASTA INJECTION. INSTRUCTED HIM TO TAKE TYLENOL. DISCUSSED WITH PT. THE ISSUE OF CONSTIPATION AND DIARRHEA. HE WILL USE MIRALAX FOR CONSTIPATION AND IMODIUM FOR DIARRHEA. PT. IS UNABLE TO EAT OR DRINK VERY MUCH DUE TO A SORE ON THE BACK OF HIS THROAT. PT. WILL USE THE MAGIC MOUTH WASH WITH LIDOCAINE. ENCOURAGED PT. TO FORCE FLUIDS, TAKE REST PERIODS DURING THE DAY, AND ASK PEOPLE TO HELP WITH THE CARE OF HIS WIFE. HE VOICES UNDERSTANDING.

## 2013-07-13 ENCOUNTER — Ambulatory Visit
Admission: RE | Admit: 2013-07-13 | Discharge: 2013-07-13 | Disposition: A | Discharge status: Home / Self Care | Payer: Medicare Other | Source: Ambulatory Visit | Attending: Radiation Oncology | Admitting: Radiation Oncology

## 2013-07-13 ENCOUNTER — Other Ambulatory Visit (HOSPITAL_BASED_OUTPATIENT_CLINIC_OR_DEPARTMENT_OTHER): Payer: Medicare Other | Admitting: Lab

## 2013-07-13 DIAGNOSIS — C3491 Malignant neoplasm of unspecified part of right bronchus or lung: Secondary | ICD-10-CM

## 2013-07-13 DIAGNOSIS — C3490 Malignant neoplasm of unspecified part of unspecified bronchus or lung: Secondary | ICD-10-CM

## 2013-07-13 LAB — COMPREHENSIVE METABOLIC PANEL (CC13)
ALT: 17 U/L (ref 0–55)
Albumin: 3.5 g/dL (ref 3.5–5.0)
CO2: 26 mEq/L (ref 22–29)
Potassium: 3.3 mEq/L — ABNORMAL LOW (ref 3.5–5.1)
Sodium: 146 mEq/L — ABNORMAL HIGH (ref 136–145)
Total Bilirubin: 2.84 mg/dL — ABNORMAL HIGH (ref 0.20–1.20)
Total Protein: 5.9 g/dL — ABNORMAL LOW (ref 6.4–8.3)

## 2013-07-13 LAB — CBC WITH DIFFERENTIAL/PLATELET
BASO%: 0.7 % (ref 0.0–2.0)
LYMPH%: 5.6 % — ABNORMAL LOW (ref 14.0–49.0)
MCHC: 35 g/dL (ref 32.0–36.0)
MCV: 91.6 fL (ref 79.3–98.0)
MONO%: 1.3 % (ref 0.0–14.0)
Platelets: 101 10*3/uL — ABNORMAL LOW (ref 140–400)
RBC: 2.95 10*6/uL — ABNORMAL LOW (ref 4.20–5.82)
RDW: 14.9 % — ABNORMAL HIGH (ref 11.0–14.6)
WBC: 2.9 10*3/uL — ABNORMAL LOW (ref 4.0–10.3)

## 2013-07-14 ENCOUNTER — Ambulatory Visit
Admission: RE | Admit: 2013-07-14 | Discharge: 2013-07-14 | Disposition: A | Discharge status: Home / Self Care | Payer: Medicare Other | Source: Ambulatory Visit | Attending: Radiation Oncology | Admitting: Radiation Oncology

## 2013-07-14 VITALS — BP 124/69 | HR 104 | Temp 97.7°F | Ht 69.0 in | Wt 145.3 lb

## 2013-07-14 DIAGNOSIS — C3490 Malignant neoplasm of unspecified part of unspecified bronchus or lung: Secondary | ICD-10-CM

## 2013-07-14 MED ORDER — HYDROCODONE-ACETAMINOPHEN 7.5-325 MG/15ML PO SOLN: 15.0000 mL | Freq: Four times a day (QID) | ORAL | Status: DC | PRN

## 2013-07-14 NOTE — Progress Notes (Addendum)
Cory [name] here in a wheelchair with his neighbor for weekly under treat visit.  He has had 20 fractions to his lung.  He received a neulasta injection last week and has had muscle aches every since.   He is having pain rating at a 10/10 when he swallows.  He reports a spot in his throat that burns when he swallows anything and it started on Friday.  He started taking magic mouthwash on Monday and it is not helping very much.  He is unable to swallow anything and has lost 11 lbs since 7/25.  Dietician will be consulted.  He also has redness and peeling around his neck and upper chest.  He has been using radiaplex gel and aloe vera gel.  He has a small scabbed area on his left upper chest.  He does have a white coating on his tongue and his mouth is very dry with thick saliva.

## 2013-07-14 NOTE — Progress Notes (Signed)
[facility]    Radiation Oncology [address] [place]     Maryln Gottron, M.D. [place], [place] [address]               Billie Lade, M.D., Ph.D. Phone: [phone]      Molli Hazard A. Kathrynn Running, M.D. Fax: [phone]      Radene Gunning, M.D., Ph.D.         Lurline Hare, M.D.         Grayland Jack, M.D Weekly Treatment Management Note  Name: Cory [name]     MRN: [id number]        CSN: [id number] Date: 07/14/2013      DOB: 04/17/1929  CC: Michele Mcalpine, MD         Kriste Basque    Status: Outpatient  Diagnosis: The encounter diagnosis was Small cell lung cancer, unspecified laterality.  Current Dose: 36  Current Fraction: 20  Planned Dose: 63  Narrative: Kristoph R Jamroz was seen today for weekly treatment management. The chart was checked and MVCT  were reviewed. The patient asked to be seen today for complaints of sore throat and difficulty swallowing. Patient does have difficulty swallowing pills at this time. He has lost approximately 15 pounds since  start of treatment.  He denies any chills or fever. He does admit to dizziness with standing.  Niacin Current Outpatient Prescriptions  Medication Sig Dispense Refill  . Alum & Mag Hydroxide-Simeth (MAGIC MOUTHWASH W/LIDOCAINE) SOLN Take 5 mLs by mouth 4 (four) times daily as needed.  250 mL  1  . amLODipine (NORVASC) 5 MG tablet TAKE ONE TABLET BY MOUTH ONE TIME DAILY  90 tablet  0  . aspirin 81 MG tablet Take 81 mg by mouth daily.        . Cetirizine HCl (ZYRTEC ALLERGY) 10 MG CAPS Take 1 capsule by mouth.      . Cholecalciferol (VITAMIN D) 2000 UNITS CAPS Take 1 capsule by mouth daily.       Marland Kitchen guaiFENesin (MUCINEX) 600 MG 12 hr tablet Take 1,200 mg by mouth daily.      . hyaluronate sodium (RADIAPLEXRX) GEL Apply topically 2 (two) times daily.      Marland Kitchen latanoprost (XALATAN) 0.005 % ophthalmic solution Place 1 drop into the left eye at bedtime.        . Multiple Vitamin (MULTIVITAMIN) capsule Take 1 capsule by mouth  daily.        . potassium chloride SA (KLOR-CON M20) 20 MEQ tablet Take 1 tablet (20 mEq total) by mouth 2 (two) times daily.  180 tablet  3  . prochlorperazine (COMPAZINE) 10 MG tablet Take 1 tablet (10 mg total) by mouth every 6 (six) hours as needed.  60 tablet  0  . simvastatin (ZOCOR) 40 MG tablet TAKE ONE TABLET BY MOUTH   NIGHTLY AT BEDTIME  90 tablet  3  . Trolamine Salicylate (MYOFLEX EX) Apply 1 application topically 2 (two) times daily as needed. For pain  AS NEEDED      . vitamin C (ASCORBIC ACID) 500 MG tablet Take 500 mg by mouth daily.        Marland Kitchen HYDROcodone-acetaminophen (HYCET) 7.5-325 mg/15 ml solution Take 15 mLs by mouth 4 (four) times daily as needed for pain.  473 mL  0   No current facility-administered medications for this encounter.   Labs:  Lab Results  Component Value Date   WBC 2.9* 07/13/2013   HGB 9.4* 07/13/2013  HCT 27.0* 07/13/2013   MCV 91.6 07/13/2013   PLT 101* 07/13/2013   Lab Results  Component Value Date   CREATININE 0.8 07/13/2013   BUN 29.7* 07/13/2013   NA 146* 07/13/2013   K 3.3* 07/13/2013   CL 104 06/08/2013   CO2 26 07/13/2013   Lab Results  Component Value Date   ALT 17 07/13/2013   AST 16 07/13/2013   BILITOT 2.84* 07/13/2013    Physical Examination:  height is 5\' 9"  (1.753 m) and weight is 145 lb 4.8 oz (65.908 kg). His temperature is 97.7 F (36.5 C). His blood pressure is 124/69 and his pulse is 104. His oxygen saturation is 100%.    Wt Readings from Last 3 Encounters:  07/14/13 145 lb 4.8 oz (65.908 kg)  07/09/13 156 lb 9.6 oz (71.033 kg)  07/06/13 152 lb 4.8 oz (69.083 kg)    The oral cavity shows thickened saliva with slight whitish coating to the dorsum of tongue but no obvious candidiasis infection Lungs - Normal respiratory effort, chest expands symmetrically. Lungs are clear to auscultation, no crackles or wheezes.  Heart has regular rhythm with slightly increased rate Abdomen is soft and non tender with normal bowel  sounds  Assessment:  Patient is having side effects with this treatment as above  Plan: Continue treatment per original radiation prescription. Recent blood work is consistent with developing dehydration. Patient will be set up for IV fluids tomorrow. I have also given him a prescription for Lortab elixir. Patient will see Dr. Mitzi Hansen again later this week.

## 2013-07-15 ENCOUNTER — Ambulatory Visit
Admission: RE | Admit: 2013-07-15 | Discharge: 2013-07-15 | Disposition: A | Discharge status: Home / Self Care | Payer: Medicare Other | Source: Ambulatory Visit | Attending: Radiation Oncology | Admitting: Radiation Oncology

## 2013-07-15 ENCOUNTER — Ambulatory Visit: Payer: Medicare Other | Admitting: Nutrition

## 2013-07-15 ENCOUNTER — Ambulatory Visit (HOSPITAL_BASED_OUTPATIENT_CLINIC_OR_DEPARTMENT_OTHER): Payer: Medicare Other

## 2013-07-15 ENCOUNTER — Other Ambulatory Visit: Payer: Self-pay | Admitting: Oncology

## 2013-07-15 DIAGNOSIS — Z5189 Encounter for other specified aftercare: Secondary | ICD-10-CM

## 2013-07-15 DIAGNOSIS — C3490 Malignant neoplasm of unspecified part of unspecified bronchus or lung: Secondary | ICD-10-CM

## 2013-07-15 MED ORDER — ONDANSETRON 8 MG/50ML IVPB (CHCC)
8.0000 mg | Freq: Once | INTRAVENOUS | Status: AC
  Administered 2013-07-15: 8 mg via INTRAVENOUS

## 2013-07-15 MED ORDER — BIAFINE EX EMUL
CUTANEOUS | Status: DC | PRN
  Administered 2013-07-15: 16:00:00 via TOPICAL

## 2013-07-15 MED ORDER — KCL IN DEXTROSE-NACL 20-5-0.45 MEQ/L-%-% IV SOLN
Freq: Once | INTRAVENOUS | Status: AC
  Administered 2013-07-15: 10:00:00 via INTRAVENOUS
  Filled 2013-07-15: qty 1000

## 2013-07-15 NOTE — Patient Instructions (Addendum)
Dehydration, Adult Dehydration is when you lose more fluids from the body than you take in. Vital organs like the kidneys, brain, and heart cannot function without a proper amount of fluids and salt. Any loss of fluids from the body can cause dehydration.  CAUSES   Vomiting.  Diarrhea.  Excessive sweating.  Excessive urine output.  Fever. SYMPTOMS  Mild dehydration  Thirst.  Dry lips.  Slightly dry mouth. Moderate dehydration  Very dry mouth.  Sunken eyes.  Skin does not bounce back quickly when lightly pinched and released.  Dark urine and decreased urine production.  Decreased tear production.  Headache. Severe dehydration  Very dry mouth.  Extreme thirst.  Rapid, weak pulse (more than 100 beats per minute at rest).  Cold hands and feet.  Not able to sweat in spite of heat and temperature.  Rapid breathing.  Blue lips.  Confusion and lethargy.  Difficulty being awakened.  Minimal urine production.  No tears. DIAGNOSIS  Your caregiver will diagnose dehydration based on your symptoms and your exam. Blood and urine tests will help confirm the diagnosis. The diagnostic evaluation should also identify the cause of dehydration. TREATMENT  Treatment of mild or moderate dehydration can often be done at home by increasing the amount of fluids that you drink. It is best to drink small amounts of fluid more often. Drinking too much at one time can make vomiting worse. Refer to the home care instructions below. Severe dehydration needs to be treated at the hospital where you will probably be given intravenous (IV) fluids that contain water and electrolytes. HOME CARE INSTRUCTIONS   Ask your caregiver about specific rehydration instructions.  Drink enough fluids to keep your urine clear or pale yellow.  Drink small amounts frequently if you have nausea and vomiting.  Eat as you normally do.  Avoid:  Foods or drinks high in sugar.  Carbonated  drinks.  Juice.  Extremely hot or cold fluids.  Drinks with caffeine.  Fatty, greasy foods.  Alcohol.  Tobacco.  Overeating.  Gelatin desserts.  Wash your hands well to avoid spreading bacteria and viruses.  Only take over-the-counter or prescription medicines for pain, discomfort, or fever as directed by your caregiver.  Ask your caregiver if you should continue all prescribed and over-the-counter medicines.  Keep all follow-up appointments with your caregiver. SEEK MEDICAL CARE IF:  You have abdominal pain and it increases or stays in one area (localizes).  You have a rash, stiff neck, or severe headache.  You are irritable, sleepy, or difficult to awaken.  You are weak, dizzy, or extremely thirsty. SEEK IMMEDIATE MEDICAL CARE IF:   You are unable to keep fluids down or you get worse despite treatment.  You have frequent episodes of vomiting or diarrhea.  You have blood or green matter (bile) in your vomit.  You have blood in your stool or your stool looks black and tarry.  You have not urinated in 6 to 8 hours, or you have only urinated a small amount of very dark urine.  You have a fever.  You faint. MAKE SURE YOU:   Understand these instructions.  Will watch your condition.  Will get help right away if you are not doing well or get worse. Document Released: 12/02/2005 Document Revised: 02/24/2012 Document Reviewed: 07/22/2011 [employer] Patient Information 2014 [employer].  

## 2013-07-15 NOTE — Progress Notes (Signed)
Patient receiving IV fluids today secondary to dehydration.  He reports throat is sore and he has difficulty swallowing.  He really has no desire to eat.  He reports eating a few bites or drinking sips after medication.  Weight has decreased to 145.3 pounds July 30 from 157.7 pounds July 1.  Patient reports everyone is "on him" to eat and drink.  He states he knows he should, but he cannot force himself.  Nutrition diagnosis:  Food and nutrition related knowledge deficit continues.  Intervention: I educated patient to try different temperatures and textures of foods and try to increase oral intake the best he can.  I provided support and encouragement for patient.  I listened him as he spoke about his family.  Monitoring, evaluation, goals: Patient will work to increase hydration, and oral intake to promote quality-of-life.  Next visit: Tuesday, August 12, during chemotherapy.

## 2013-07-16 ENCOUNTER — Ambulatory Visit
Admission: RE | Admit: 2013-07-16 | Discharge: 2013-07-16 | Disposition: A | Discharge status: Home / Self Care | Payer: Medicare Other | Source: Ambulatory Visit | Attending: Radiation Oncology | Admitting: Radiation Oncology

## 2013-07-16 ENCOUNTER — Encounter: Payer: Self-pay | Admitting: Radiation Oncology

## 2013-07-16 MED ORDER — SUCRALFATE 1 G PO TABS: 1.0000 g | ORAL_TABLET | Freq: Four times a day (QID) | ORAL | Status: DC

## 2013-07-16 NOTE — Progress Notes (Signed)
Patient reports difficulty and very painful swallowing. Patient reports that he is not eating because it "hurts so bad to swallow." Patient reports using hycet 7.5/325 mg as directed. Significant decline of weight seen. Patient reports that he saw Zenovia Jarred yesterday. Hyperpigmentation with dry desquamation of neck noted. Patient reports using biafine cream as directed. Thrush noted.

## 2013-07-16 NOTE — Progress Notes (Signed)
Department of Radiation Oncology  Phone:  [phone] Fax:        [phone]  Weekly Treatment Note    Name: Cory [name] Date: 07/16/2013 MRN: [id number] DOB: Sep 11, 1929   Current dose: 41.4 Gy  Current fraction: 23   MEDICATIONS: Current Outpatient Prescriptions  Medication Sig Dispense Refill  . Alum & Mag Hydroxide-Simeth (MAGIC MOUTHWASH W/LIDOCAINE) SOLN Take 5 mLs by mouth 4 (four) times daily as needed.  250 mL  1  . amLODipine (NORVASC) 5 MG tablet TAKE ONE TABLET BY MOUTH ONE TIME DAILY  90 tablet  0  . aspirin 81 MG tablet Take 81 mg by mouth daily.        . Cetirizine HCl (ZYRTEC ALLERGY) 10 MG CAPS Take 1 capsule by mouth.      . Cholecalciferol (VITAMIN D) 2000 UNITS CAPS Take 1 capsule by mouth daily.       Marland Kitchen guaiFENesin (MUCINEX) 600 MG 12 hr tablet Take 1,200 mg by mouth daily.      . hyaluronate sodium (RADIAPLEXRX) GEL Apply topically 2 (two) times daily.      Marland Kitchen HYDROcodone-acetaminophen (HYCET) 7.5-325 mg/15 ml solution Take 15 mLs by mouth 4 (four) times daily as needed for pain.  473 mL  0  . latanoprost (XALATAN) 0.005 % ophthalmic solution Place 1 drop into the left eye at bedtime.        . Multiple Vitamin (MULTIVITAMIN) capsule Take 1 capsule by mouth daily.        . potassium chloride SA (KLOR-CON M20) 20 MEQ tablet Take 1 tablet (20 mEq total) by mouth 2 (two) times daily.  180 tablet  3  . prochlorperazine (COMPAZINE) 10 MG tablet Take 1 tablet (10 mg total) by mouth every 6 (six) hours as needed.  60 tablet  0  . simvastatin (ZOCOR) 40 MG tablet TAKE ONE TABLET BY MOUTH   NIGHTLY AT BEDTIME  90 tablet  3  . Trolamine Salicylate (MYOFLEX EX) Apply 1 application topically 2 (two) times daily as needed. For pain  AS NEEDED      . vitamin C (ASCORBIC ACID) 500 MG tablet Take 500 mg by mouth daily.         No current facility-administered medications for this encounter.     ALLERGIES: Niacin   LABORATORY DATA:  Lab Results  Component Value  Date   WBC 2.9* 07/13/2013   HGB 9.4* 07/13/2013   HCT 27.0* 07/13/2013   MCV 91.6 07/13/2013   PLT 101* 07/13/2013   Lab Results  Component Value Date   NA 146* 07/13/2013   K 3.3* 07/13/2013   CL 104 06/08/2013   CO2 26 07/13/2013   Lab Results  Component Value Date   ALT 17 07/13/2013   AST 16 07/13/2013   ALKPHOS 74 07/13/2013   BILITOT 2.84* 07/13/2013     NARRATIVE: Cory [name] was seen today for weekly treatment management. The chart was checked and the patient's films were reviewed. The patient is complaining of significant esophagitis. He started using some pain medicine yesterday and also has begun Magic mouthwash recently. The patient has been losing quite a bit of weight recently. He also is all nutrition yesterday. He therefore is just beginning to try some new things. He is drinking Ensure a [place] bit each day.  PHYSICAL EXAMINATION: weight is 141 lb 4.8 oz (64.093 kg). His blood pressure is 124/65 and his pulse is 88. His respiration is 16 and oxygen saturation is 100%.  I do not see any thrush present in the oral cavity  ASSESSMENT: The patient is having significant esophagitis as his dominant issue so far.  PLAN: We will continue with the patient's radiation treatment as planned. I discussed beginning Nexium and also I have given him a prescription for carafate which he also will begin in addition to continuing both Magic mouthwash and his pain medication.

## 2013-07-19 ENCOUNTER — Ambulatory Visit
Admission: RE | Admit: 2013-07-19 | Discharge: 2013-07-19 | Disposition: A | Discharge status: Home / Self Care | Payer: Medicare Other | Source: Ambulatory Visit | Attending: Radiation Oncology | Admitting: Radiation Oncology

## 2013-07-19 ENCOUNTER — Encounter: Payer: Self-pay | Admitting: Radiation Oncology

## 2013-07-19 ENCOUNTER — Other Ambulatory Visit: Payer: Self-pay

## 2013-07-19 ENCOUNTER — Encounter (HOSPITAL_COMMUNITY): Payer: Self-pay | Admitting: Emergency Medicine

## 2013-07-19 ENCOUNTER — Telehealth: Payer: Self-pay | Admitting: *Deleted

## 2013-07-19 ENCOUNTER — Encounter: Payer: Self-pay | Admitting: *Deleted

## 2013-07-19 ENCOUNTER — Observation Stay (HOSPITAL_BASED_OUTPATIENT_CLINIC_OR_DEPARTMENT_OTHER)
Admission: EM | Admit: 2013-07-19 | Discharge: 2013-07-20 | Disposition: A | Discharge status: Home / Self Care | Payer: Medicare Other | Source: Home / Self Care | Attending: Emergency Medicine | Admitting: Emergency Medicine

## 2013-07-19 ENCOUNTER — Other Ambulatory Visit: Payer: Self-pay | Admitting: Oncology

## 2013-07-19 DIAGNOSIS — B370 Candidal stomatitis: Secondary | ICD-10-CM

## 2013-07-19 DIAGNOSIS — D61818 Other pancytopenia: Secondary | ICD-10-CM | POA: Insufficient documentation

## 2013-07-19 DIAGNOSIS — E876 Hypokalemia: Secondary | ICD-10-CM | POA: Insufficient documentation

## 2013-07-19 DIAGNOSIS — F17200 Nicotine dependence, unspecified, uncomplicated: Secondary | ICD-10-CM

## 2013-07-19 DIAGNOSIS — N179 Acute kidney failure, unspecified: Secondary | ICD-10-CM | POA: Insufficient documentation

## 2013-07-19 DIAGNOSIS — E860 Dehydration: Secondary | ICD-10-CM

## 2013-07-19 DIAGNOSIS — C3490 Malignant neoplasm of unspecified part of unspecified bronchus or lung: Secondary | ICD-10-CM

## 2013-07-19 DIAGNOSIS — I10 Essential (primary) hypertension: Secondary | ICD-10-CM

## 2013-07-19 DIAGNOSIS — K208 Other esophagitis without bleeding: Secondary | ICD-10-CM | POA: Insufficient documentation

## 2013-07-19 DIAGNOSIS — H409 Unspecified glaucoma: Secondary | ICD-10-CM

## 2013-07-19 DIAGNOSIS — T66XXXS Radiation sickness, unspecified, sequela: Secondary | ICD-10-CM | POA: Insufficient documentation

## 2013-07-19 DIAGNOSIS — K209 Esophagitis, unspecified without bleeding: Secondary | ICD-10-CM | POA: Insufficient documentation

## 2013-07-19 DIAGNOSIS — Y842 Radiological procedure and radiotherapy as the cause of abnormal reaction of the patient, or of later complication, without mention of misadventure at the time of the procedure: Secondary | ICD-10-CM | POA: Insufficient documentation

## 2013-07-19 DIAGNOSIS — E870 Hyperosmolality and hypernatremia: Secondary | ICD-10-CM | POA: Insufficient documentation

## 2013-07-19 DIAGNOSIS — Z87891 Personal history of nicotine dependence: Secondary | ICD-10-CM | POA: Insufficient documentation

## 2013-07-19 DIAGNOSIS — J449 Chronic obstructive pulmonary disease, unspecified: Secondary | ICD-10-CM

## 2013-07-19 DIAGNOSIS — M5430 Sciatica, unspecified side: Secondary | ICD-10-CM | POA: Insufficient documentation

## 2013-07-19 DIAGNOSIS — Z79899 Other long term (current) drug therapy: Secondary | ICD-10-CM | POA: Insufficient documentation

## 2013-07-19 LAB — BASIC METABOLIC PANEL (CC13)
Chloride: 116 mEq/L — ABNORMAL HIGH (ref 98–109)
Creatinine: 1.8 mg/dL — ABNORMAL HIGH (ref 0.7–1.3)
Potassium: 3.1 mEq/L — ABNORMAL LOW (ref 3.5–5.1)
Sodium: 156 mEq/L — ABNORMAL HIGH (ref 136–145)

## 2013-07-19 LAB — COMPREHENSIVE METABOLIC PANEL
ALT: 12 U/L (ref 0–53)
AST: 10 U/L (ref 0–37)
Albumin: 3.5 g/dL (ref 3.5–5.2)
Alkaline Phosphatase: 51 U/L (ref 39–117)
Chloride: 113 mEq/L — ABNORMAL HIGH (ref 96–112)
Potassium: 2.9 mEq/L — ABNORMAL LOW (ref 3.5–5.1)
Sodium: 153 mEq/L — ABNORMAL HIGH (ref 135–145)
Total Bilirubin: 1.2 mg/dL (ref 0.3–1.2)
Total Protein: 6.1 g/dL (ref 6.0–8.3)

## 2013-07-19 LAB — CBC WITH DIFFERENTIAL/PLATELET
BASO%: 0 % (ref 0.0–2.0)
Hemoglobin: 7.9 g/dL — ABNORMAL LOW (ref 13.0–17.0)
Lymphocytes Relative: 6 % — ABNORMAL LOW (ref 12–46)
Lymphs Abs: 0.2 10*3/uL — ABNORMAL LOW (ref 0.7–4.0)
MCH: 31.1 pg (ref 26.0–34.0)
MCHC: 33.2 g/dL (ref 32.0–36.0)
MONO#: 0.4 10*3/uL (ref 0.1–0.9)
Monocytes Relative: 11 % (ref 3–12)
Neutrophils Relative %: 82 % — ABNORMAL HIGH (ref 43–77)
Platelets: 9 10*3/uL — CL (ref 150–400)
RBC: 2.58 10*6/uL — ABNORMAL LOW (ref 4.20–5.82)
RBC: 2.54 MIL/uL — ABNORMAL LOW (ref 4.22–5.81)
RDW: 15.6 % — ABNORMAL HIGH (ref 11.0–14.6)
WBC Morphology: INCREASED
WBC: 3.8 10*3/uL — ABNORMAL LOW (ref 4.0–10.3)
WBC: 3.8 10*3/uL — ABNORMAL LOW (ref 4.0–10.5)
lymph#: 0.2 10*3/uL — ABNORMAL LOW (ref 0.9–3.3)
nRBC: 0 % (ref 0–0)

## 2013-07-19 LAB — URINALYSIS, ROUTINE W REFLEX MICROSCOPIC
Bilirubin Urine: NEGATIVE
Hgb urine dipstick: NEGATIVE
Ketones, ur: NEGATIVE mg/dL
Protein, ur: NEGATIVE mg/dL
Specific Gravity, Urine: 1.024 (ref 1.005–1.030)
Urobilinogen, UA: 0.2 mg/dL (ref 0.0–1.0)

## 2013-07-19 LAB — ABO/RH: ABO/RH(D): B POS

## 2013-07-19 LAB — TROPONIN I: Troponin I: <0.3 ng/mL (ref <0.30)

## 2013-07-19 MED ORDER — BIOTENE DRY MOUTH MT LIQD: 15.0000 mL | OROMUCOSAL | Status: DC | PRN

## 2013-07-19 MED ORDER — SODIUM CHLORIDE 0.9 % IJ SOLN: 3.0000 mL | Freq: Two times a day (BID) | INTRAMUSCULAR | Status: DC

## 2013-07-19 MED ORDER — FENTANYL 12 MCG/HR TD PT72
12.5000 ug | MEDICATED_PATCH | TRANSDERMAL | Status: DC
  Filled 2013-07-19: qty 1

## 2013-07-19 MED ORDER — FLUCONAZOLE 100 MG PO TABS: ORAL_TABLET | ORAL | Status: DC

## 2013-07-19 MED ORDER — PANTOPRAZOLE SODIUM 40 MG IV SOLR
40.0000 mg | INTRAVENOUS | Status: DC
  Filled 2013-07-19 (×2): qty 40

## 2013-07-19 MED ORDER — FLUCONAZOLE 100MG IVPB
100.0000 mg | INTRAVENOUS | Status: DC
  Filled 2013-07-19: qty 50

## 2013-07-19 MED ORDER — POTASSIUM CHLORIDE CRYS ER 20 MEQ PO TBCR
40.0000 meq | EXTENDED_RELEASE_TABLET | Freq: Once | ORAL | Status: AC
  Administered 2013-07-19: 40 meq via ORAL
  Filled 2013-07-19: qty 2

## 2013-07-19 MED ORDER — SODIUM CHLORIDE 0.9 % IV BOLUS (SEPSIS)
1000.0000 mL | Freq: Once | INTRAVENOUS | Status: AC
  Administered 2013-07-19: 1000 mL via INTRAVENOUS

## 2013-07-19 MED ORDER — NYSTATIN 100000 UNIT/ML MT SUSP
5.0000 mL | Freq: Four times a day (QID) | OROMUCOSAL | Status: DC
  Filled 2013-07-19 (×5): qty 5

## 2013-07-19 MED ORDER — LATANOPROST 0.005 % OP SOLN
1.0000 [drp] | Freq: Every day | OPHTHALMIC | Status: DC
  Administered 2013-07-19: 1 [drp] via OPHTHALMIC
  Filled 2013-07-19: qty 2.5

## 2013-07-19 MED ORDER — ACETAMINOPHEN 160 MG/5ML PO SOLN: 500.0000 mg | Freq: Four times a day (QID) | ORAL | Status: DC | PRN

## 2013-07-19 MED ORDER — FENTANYL 12 MCG/HR TD PT72: 1.0000 | MEDICATED_PATCH | TRANSDERMAL | Status: DC

## 2013-07-19 MED ORDER — BIAFINE EX EMUL
Freq: Two times a day (BID) | CUTANEOUS | Status: DC
  Administered 2013-07-19: 16:00:00 via TOPICAL

## 2013-07-19 MED ORDER — MORPHINE SULFATE (CONCENTRATE) 10 MG /0.5 ML PO SOLN: 5.0000 mg | ORAL | Status: DC | PRN

## 2013-07-19 MED ORDER — KCL IN DEXTROSE-NACL 40-5-0.45 MEQ/L-%-% IV SOLN
INTRAVENOUS | Status: DC
  Administered 2013-07-19: 100 mL/h via INTRAVENOUS
  Administered 2013-07-20: 04:00:00 via INTRAVENOUS
  Filled 2013-07-19 (×3): qty 1000

## 2013-07-19 MED ORDER — MAGIC MOUTHWASH W/LIDOCAINE
5.0000 mL | Freq: Four times a day (QID) | ORAL | Status: DC | PRN
  Filled 2013-07-19: qty 5

## 2013-07-19 NOTE — Progress Notes (Signed)
Spoke with pt and wife at [facility] today.  Pt is having difficulty swallowing. Not eating or drinking much, according to wife.  I gave them a walk in form to fill out.  Triage will evaluate

## 2013-07-19 NOTE — ED Notes (Signed)
Pt presenting to ed with c/o at cancer center today getting radiation and having abnormal labs with platelets 8 bun 82.4 creatnine 1.8 per Renaldo Reel RN at Cancer center per Rn pt was orthostatic at the cancer center prior to arrival to ED

## 2013-07-19 NOTE — ED Provider Notes (Signed)
CSN: [id number]     Arrival date & time 07/19/13  1320 History     First MD Initiated Contact with Patient 07/19/13 1403     Chief Complaint  Patient presents with  . Dizziness   (Consider location/radiation/quality/duration/timing/severity/associated sxs/prior Treatment) HPI Comments: 79 yo male with smoking hx, htn, small cell lung CA, recent chemo/ radiation, Dr Gwenyth Bouillon oncologist presents to ER from cancer center with general weakness and lightheaded for a few days.  Pt has not been able to tolerate fluids or food for a week due to cancer treatments.  The past month he has had gradually worsening sciatica pain with the radiation. Denies bleeding.  Nothing improves.  No cp or sob.    The history is provided by the patient and a relative.    Past Medical History  Diagnosis Date  . OSA (obstructive sleep apnea)   . COPD (chronic obstructive pulmonary disease)   . Cigarette smoker   . Hypertension   . Venous insufficiency   . Hypercholesteremia   . Borderline diabetes mellitus   . Diverticulosis of colon   . Hx of colonic polyps   . BPH (benign prostatic hyperplasia)   . DJD (degenerative joint disease)   . Lumbar back pain   . Leg cramps   . History of skin cancer   . History of shingles   . History of chemotherapy 05/25/2013    carboplatin, etoposide, neulasta every 3 weeks    Past Surgical History  Procedure Laterality Date  . Appendectomy    . Tonsillectomy and adenoidectomy    . Left inguinal hernia repair  1998    Dr. Earlene Plater  . Lumbar laminectomy  1997    Dr. Roxan Hockey  . Lymph node biopsy  05/06/2013   Family History  Problem Relation Age of Onset  . Cervical cancer Mother   . Breast cancer Sister   . Prostate cancer Father    History  Substance Use Topics  . Smoking status: Current Every Day Smoker -- 1.00 packs/day for 50 years    Types: Cigarettes  . Smokeless tobacco: Never Used  . Alcohol Use: No     Comment: rare use    Review of Systems   Constitutional: Positive for appetite change and fatigue. Negative for fever, chills and diaphoresis.  HENT: Negative for neck pain and neck stiffness.   Eyes: Negative for visual disturbance.  Respiratory: Negative for shortness of breath.   Cardiovascular: Negative for chest pain.  Gastrointestinal: Positive for nausea and vomiting. Negative for abdominal pain.  Genitourinary: Negative for dysuria and flank pain.  Musculoskeletal: Negative for back pain.  Skin: Negative for rash.  Neurological: Positive for light-headedness. Negative for syncope and headaches.    Allergies  Niacin  Home Medications   Current Outpatient Rx  Name  Route  Sig  Dispense  Refill  . acetaminophen (TYLENOL) 500 MG tablet   Oral   Take 500 mg by mouth every 6 (six) hours as needed for pain.         Marland Kitchen Alum & Mag Hydroxide-Simeth (MAGIC MOUTHWASH W/LIDOCAINE) SOLN   Oral   Take 5 mLs by mouth 4 (four) times daily as needed.   250 mL   1   . amLODipine (NORVASC) 5 MG tablet      TAKE ONE TABLET BY MOUTH ONE TIME DAILY   90 tablet   0     Pt needs ov with SN for further refills.   Marland Kitchen antiseptic oral rinse (BIOTENE)  LIQD   Mouth Rinse   15 mLs by Mouth Rinse route as needed.         Marland Kitchen aspirin 81 MG tablet   Oral   Take 81 mg by mouth daily.           . Cetirizine HCl (ZYRTEC ALLERGY) 10 MG CAPS   Oral   Take 1 capsule by mouth.         . Cholecalciferol (VITAMIN D) 2000 UNITS CAPS   Oral   Take 1 capsule by mouth daily.          Marland Kitchen emollient (BIAFINE) cream   Topical   Apply topically 2 (two) times daily.         Marland Kitchen guaiFENesin (MUCINEX) 600 MG 12 hr tablet   Oral   Take 1,200 mg by mouth daily.         . hyaluronate sodium (RADIAPLEXRX) GEL   Topical   Apply topically 2 (two) times daily.         Marland Kitchen HYDROcodone-acetaminophen (HYCET) 7.5-325 mg/15 ml solution   Oral   Take 15 mLs by mouth 4 (four) times daily as needed for pain.   473 mL   0   . latanoprost  (XALATAN) 0.005 % ophthalmic solution   Left Eye   Place 1 drop into the left eye at bedtime.           . Multiple Vitamin (MULTIVITAMIN) capsule   Oral   Take 1 capsule by mouth daily.           . potassium chloride SA (KLOR-CON M20) 20 MEQ tablet   Oral   Take 1 tablet (20 mEq total) by mouth 2 (two) times daily.   180 tablet   3     Pt needs ov with Dr. Kriste Basque for further refills.   . prochlorperazine (COMPAZINE) 10 MG tablet   Oral   Take 1 tablet (10 mg total) by mouth every 6 (six) hours as needed.   60 tablet   0   . sucralfate (CARAFATE) 1 G tablet   Oral   Take 1 tablet (1 g total) by mouth 4 (four) times daily.   120 tablet   2   . Trolamine Salicylate (MYOFLEX EX)   Apply externally   Apply 1 application topically 2 (two) times daily as needed. For pain  AS NEEDED         . vitamin C (ASCORBIC ACID) 500 MG tablet   Oral   Take 500 mg by mouth daily.           . fentaNYL (DURAGESIC - DOSED MCG/HR) 12 MCG/HR   Transdermal   Place 1 patch (12.5 mcg total) onto the skin every 3 (three) days.   5 patch   0   . fluconazole (DIFLUCAN) 100 MG tablet      Take 2 tabs today, then 1 tab daily for 20 more days for thrush   22 tablet   0    BP 94/59  Pulse 99  Temp(Src) 98 F (36.7 C) (Oral)  Resp 20  SpO2 100% Physical Exam  Nursing note and vitals reviewed. Constitutional: He is oriented to person, place, and time. He appears well-developed. No distress.  HENT:  Head: Normocephalic and atraumatic.  Moderate dry mm  Eyes: Conjunctivae are normal. Right eye exhibits no discharge. Left eye exhibits no discharge.  Neck: Normal range of motion. Neck supple. No tracheal deviation present.  Cardiovascular: Regular rhythm.  Tachycardia  present.   Pulses:      Radial pulses are 2+ on the right side, and 2+ on the left side.  Pulmonary/Chest: Effort normal and breath sounds normal.  Abdominal: Soft. He exhibits no distension. There is no tenderness.  There is no guarding.  Musculoskeletal: He exhibits no edema.  Lymphadenopathy:    He has no cervical adenopathy.  Neurological: He is alert and oriented to person, place, and time. No cranial nerve deficit.  Skin: Skin is warm. Rash (small skin tears) noted.  Psychiatric: He has a normal mood and affect.    ED Course   Procedures (including critical care time)  Labs Reviewed  CBC WITH DIFFERENTIAL - Abnormal; Notable for the following:    WBC 3.8 (*)    RBC 2.54 (*)    Hemoglobin 7.9 (*)    HCT 23.0 (*)    Platelets 9 (*)    Neutrophils Relative % 82 (*)    Lymphocytes Relative 6 (*)    Lymphs Abs 0.2 (*)    All other components within normal limits  COMPREHENSIVE METABOLIC PANEL - Abnormal; Notable for the following:    Sodium 153 (*)    Potassium 2.9 (*)    Chloride 113 (*)    Glucose, Bld 121 (*)    BUN 81 (*)    Creatinine, Ser 1.79 (*)    GFR calc non Af Amer 34 (*)    GFR calc Af Amer 39 (*)    All other components within normal limits  TROPONIN I  MAGNESIUM   No results found. No diagnosis found.  MDM  Concern for dehydration and weakness SE of chemo/ cancer . Pancytopenia. Fluid bolus given for dehydration.   Date: 07/19/2013  Rate: 92  Rhythm: normal sinus rhythm  QRS Axis: normal  Intervals: QT prolonged  ST/T Wave abnormalities: normal  Conduction Disutrbances:none  Narrative Interpretation:   Old EKG Reviewed: unchanged  Initially pt requesting to go home, after speaking with Dr Gwenyth Bouillon we were able to convince him to stay, family at bedside.  No bleeding in ED, type and screen.  No changes on recheck.  Fluid boluses in ED.  No signs of infection.  UA pending.  Spoke with Dr Gwenyth Bouillon on consult. Paged hospitalist for admission. Hospitalist accepted, oncology on consult.  Admission placed.      Enid Skeens, MD 07/21/13 2200

## 2013-07-19 NOTE — Telephone Encounter (Signed)
Called [facility] and spoke to [place].  Let her know that Dr. Mitzi Hansen would like to cancel Cory [name]'s treatment for tomorrow and Wednesday due to his low platelet count.

## 2013-07-19 NOTE — Progress Notes (Addendum)
Cory [name] here in a wheelchair for weekly under treat visit with his wife, daughter and granddaughtert.  He has had 24 fractions to his lung.  He rates his pain in his throat with swallowing and his right leg at a 10/10.  He took once does of hycet this morning but says it really burns.  He is also taking carafate but does not know if it works.  He is not able to swallow pills and has been drinking a "shot glass" full of fluid (water, apple juice, ensure) every 5 hours.  His mouth is very dry and he is using biatine.  He is dizzy with standing.  His orthostatic vitals were bp of 103/60 and HR 100 sitting and 82/46 and 109 standing.  He is also concerned about his skin.  It is red and peeling around his neck and underneath both of his ears.  He has been using biafine and needs a refill.  He will be given another tube.  He has lost 5 lbs since 07/16/2013.  He is seeing Zenovia Jarred, Dietician.  Late entry:  Brought [name] to [facility] in a wheelchair per Dr. Basilio Cairo and Dr. Shirline Frees.  Brought him to ER room 5 and gave report to Ben Wheeler, Charity fundraiser.  Told her that labs had been drawn in the Cancer Center with abnormal platelets and bun/creatinine results.  Also told about his history of pain with swallowing for the last week and abnormal orthostatic vitals.  Left my name and number for any questions.

## 2013-07-19 NOTE — Telephone Encounter (Signed)
Judeth Cornfield, RN called and left a voice message, per her request, that Mr. Cory [name] has a K+ level of 3.1 and a BUN of 82.4 and Creat. of 1.8

## 2013-07-19 NOTE — Addendum Note (Signed)
Encounter addended by: Eduardo Osier, RN on: 07/19/2013  4:07 PM<BR>     Documentation filed: Inpatient MAR

## 2013-07-19 NOTE — Progress Notes (Addendum)
Weekly Management Note:  Outpatient Current Dose:  43.2 Gy  Projected Dose: 63 Gy   Narrative:  The patient presents for routine under treatment assessment.  CBCT/MVCT images/Port film x-rays were reviewed.  The chart was checked. He is having a difficult time with  1) esophagitis: worsening; burns more when taking Hycet.  Cannot swallow pills 2) poor PO intake: 6 oz a day. Minimal food. Dizzy with standing.  Peitz urine output. 3) sciatica - present for 2 years, but getting worse/severe. Right leg.  Patient convinced RT is responsible for worsening this.   Physical Findings:  height is 5\' 9"  (1.753 m) and weight is 136 lb 8 oz (61.916 kg). His temperature is 98.1 F (36.7 C). His blood pressure is 82/46 and his pulse is 109. His oxygen saturation is 100%.   In wheelchair. Copious oral pharyngeal thrush.  Biafine on neck, overlying erythematous peeling skin.  LABS ORDERED STAT after visit: CBC    Component Value Date/Time   WBC 3.8* 07/19/2013 1206   WBC 8.4 05/06/2013 0935   RBC 2.58* 07/19/2013 1206   RBC 4.64 05/06/2013 0935   HGB 7.7* 07/19/2013 1206   HGB 14.5 05/06/2013 0935   HCT 23.2* 07/19/2013 1206   HCT 41.2 05/06/2013 0935   PLT 8* 07/19/2013 1206   PLT 202 05/06/2013 0935   MCV 89.9 07/19/2013 1206   MCV 88.8 05/06/2013 0935   MCH 29.8 07/19/2013 1206   MCH 31.3 05/06/2013 0935   MCHC 33.2 07/19/2013 1206   MCHC 35.2 05/06/2013 0935   RDW 15.6* 07/19/2013 1206   RDW 12.8 05/06/2013 0935   LYMPHSABS 0.2* 07/19/2013 1206   LYMPHSABS 3.3 04/27/2013 1631   MONOABS 0.4 07/19/2013 1206   MONOABS 1.0 04/27/2013 1631   EOSABS 0.0 07/19/2013 1206   EOSABS 0.2 04/27/2013 1631   BASOSABS 0.0 07/19/2013 1206   BASOSABS 0.1 04/27/2013 1631     Impression:  The patient is tolerating radiotherapy with difficulty.   Plan:  1) esophagitis with thrush - ordered fluconazole.  Start Duragesic 12 mcg patch.  Continue hycet if it can be tolerated diluted with H20 to avoid burning. We may escalate Duragesic  later.  2) poor po intake - orthostatic.  Ordered CBC  BMP.  BMP pending, CBC as above. Informed med/ onc Tiana Loft of PLTs of 8.  Will defer to Dr. Mitzi Hansen as to whether he wants to hold RT tomorrow. Pt already considering skipping a day due to pain and decreased stamina. Will order IV fluids after BMP returns.  3) Sciatic pain. Discussed at pt's request w/ his Orthopedic surgeon, Dr Cleophas Dunker.  Dr. Cleophas Dunker would like an MRI ordered of the L-S spine, and he will work pt into his schedule after MRI.  Nurse will inform pt of this.  Pt to call [phone] and press #1 to make appt with Dr Lacretia Nicks. -----------------------------------  Lonie Peak, MD  ADDENDUM:  BMET    Component Value Date/Time   NA 156* 07/19/2013 1206   NA 140 04/27/2013 1631   K 3.1* 07/19/2013 1206   K 3.5 04/27/2013 1631   CL 104 06/08/2013 1426   CL 102 04/27/2013 1631   CO2 27 07/19/2013 1206   CO2 32 04/27/2013 1631   GLUCOSE 128 07/19/2013 1206   GLUCOSE 97 06/08/2013 1426   GLUCOSE 95 04/27/2013 1631   GLUCOSE 137* 12/15/2006 1022   BUN 82.4* 07/19/2013 1206   BUN 14 04/27/2013 1631   CREATININE 1.8* 07/19/2013 1206  CREATININE 1.0 04/27/2013 1631   CALCIUM 9.6 07/19/2013 1206   CALCIUM 9.0 04/27/2013 1631   GFRNONAA 106.84 04/20/2010 0816   GFRAA 105 08/23/2008 0806   BMP results as above. Dicussed above findings with Dr Arbutus Ped who advised for patient to be sent to ED.  Renaldo Reel will contact the patient with these instructions.  -----------------------------------  Lonie Peak, MD

## 2013-07-19 NOTE — H&P (Signed)
[facility] History and Physical  Cory [name] UJW:[id number] DOB: 1929-12-05 DOA: 07/19/2013  Referring physician: Dr. Jodi Mourning PCP: Michele Mcalpine, MD  Specialists: Dr. Arbutus Ped  Chief Complaint: dehydration, ARF, pancytopenia, hypernatremia/hypokalemia  HPI: Cory [name] is a 79 y.o. male with pmh as mentioned below but significant for lung cancer with ongoing chemotherapy and radiation treatment. Came to ED from cancer center after receiving radiation therapy with complaints of severe dehydration, lightheadedness and decrease ability for PO intake. Patient found with diffuse oral thrush and is suffering of severe radiation esophagitis. Today's lab demonstrated ARF, pancytopenia (with platelets of 8), hypernatremia and hypokalemia. Patient referred to ED for hospital admission and further evaluation and treatment. Patient complaining of worsening sciatica and difficulty to eat, otherwise denies SOB, CP, diarrhea, dysuria, fever, chills, melena, hematemesis, hematochezia or any other complaints.  Review of Systems:  Negative except as otherwise mentioned on HPI.  Past Medical History  Diagnosis Date  . OSA (obstructive sleep apnea)   . COPD (chronic obstructive pulmonary disease)   . Cigarette smoker   . Hypertension   . Venous insufficiency   . Hypercholesteremia   . Borderline diabetes mellitus   . Diverticulosis of colon   . Hx of colonic polyps   . BPH (benign prostatic hyperplasia)   . DJD (degenerative joint disease)   . Lumbar back pain   . Leg cramps   . History of skin cancer   . History of shingles   . History of chemotherapy 05/25/2013    carboplatin, etoposide, neulasta every 3 weeks    Past Surgical History  Procedure Laterality Date  . Appendectomy    . Tonsillectomy and adenoidectomy    . Left inguinal hernia repair  1998    Dr. Earlene Plater  . Lumbar laminectomy  1997    Dr. Roxan Hockey  . Lymph node biopsy  05/06/2013   Social History:  reports that he has been  smoking Cigarettes.  He has a 50 pack-year smoking history. He has never used smokeless tobacco. He reports that he does not drink alcohol or use illicit drugs.   Allergies  Allergen Reactions  . Niacin     REACTION: Intol to Niacin w/ flushing in large doses    Family History  Problem Relation Age of Onset  . Cervical cancer Mother   . Breast cancer Sister   . Prostate cancer Father     Prior to Admission medications   Medication Sig Start Date End Date Taking? Authorizing Provider  acetaminophen (TYLENOL) 500 MG tablet Take 500 mg by mouth every 6 (six) hours as needed for pain.   Yes Historical Provider, MD  Alum & Mag Hydroxide-Simeth (MAGIC MOUTHWASH W/LIDOCAINE) SOLN Take 5 mLs by mouth 4 (four) times daily as needed. 07/09/13  Yes Jonna Coup, MD  amLODipine (NORVASC) 5 MG tablet TAKE ONE TABLET BY MOUTH ONE TIME DAILY 04/09/13  Yes Michele Mcalpine, MD  antiseptic oral rinse (BIOTENE) LIQD 15 mLs by Mouth Rinse route as needed.   Yes Historical Provider, MD  aspirin 81 MG tablet Take 81 mg by mouth daily.     Yes Historical Provider, MD  Cetirizine HCl (ZYRTEC ALLERGY) 10 MG CAPS Take 1 capsule by mouth.   Yes Historical Provider, MD  Cholecalciferol (VITAMIN D) 2000 UNITS CAPS Take 1 capsule by mouth daily.    Yes Historical Provider, MD  emollient (BIAFINE) cream Apply topically 2 (two) times daily.   Yes Historical Provider, MD  guaiFENesin (MUCINEX) 600 MG  12 hr tablet Take 1,200 mg by mouth daily.   Yes Historical Provider, MD  hyaluronate sodium (RADIAPLEXRX) GEL Apply topically 2 (two) times daily.   Yes Historical Provider, MD  HYDROcodone-acetaminophen (HYCET) 7.5-325 mg/15 ml solution Take 15 mLs by mouth 4 (four) times daily as needed for pain. 07/14/13  Yes Billie Lade, MD  latanoprost (XALATAN) 0.005 % ophthalmic solution Place 1 drop into the left eye at bedtime.     Yes Historical Provider, MD  Multiple Vitamin (MULTIVITAMIN) capsule Take 1 capsule by mouth  daily.     Yes Historical Provider, MD  potassium chloride SA (KLOR-CON M20) 20 MEQ tablet Take 1 tablet (20 mEq total) by mouth 2 (two) times daily. 08/06/12 08/06/13 Yes Michele Mcalpine, MD  prochlorperazine (COMPAZINE) 10 MG tablet Take 1 tablet (10 mg total) by mouth every 6 (six) hours as needed. 05/25/13  Yes Si Gaul, MD  sucralfate (CARAFATE) 1 G tablet Take 1 tablet (1 g total) by mouth 4 (four) times daily. 07/16/13  Yes Jonna Coup, MD  Trolamine Salicylate [facility] EX) Apply 1 application topically 2 (two) times daily as needed. For pain  AS NEEDED   Yes Historical Provider, MD  vitamin C (ASCORBIC ACID) 500 MG tablet Take 500 mg by mouth daily.     Yes Historical Provider, MD  fentaNYL (DURAGESIC - DOSED MCG/HR) 12 MCG/HR Place 1 patch (12.5 mcg total) onto the skin every 3 (three) days. 07/19/13   Lonie Peak, MD  fluconazole (DIFLUCAN) 100 MG tablet Take 2 tabs today, then 1 tab daily for 20 more days for thrush 07/19/13   Lonie Peak, MD   Physical Exam: Filed Vitals:   07/19/13 1635  BP: 118/44  Pulse: 102  Temp:   Resp: 20     General:  Dry MM, mild distress and easily desaturation while trying to speak in full sentences  Eyes: PRRL, no icterus, no nystagmus  ENT: dry MM, extensive thrush and mild erythema on his throat; poor dentition, no drainage out of ears or nostrils  Neck: supple, FROM, no thyromegaly  Cardiovascular: tachycardic, no rubs or gallops  Respiratory: good air movement, diffuse rhonchi, no wheezing  Abdomen: no tenderness, no distension, positive BS  Skin: dry skin, overlying erythematous peeling skin around chest and base of neck; decrease skin turgor  Musculoskeletal: no edema, no cyanosis; complaining of RLE pain (sciatica) with movements and certain positions  Psychiatric: frustrated, irritable, mood; don't want to be admitted  Neurologic: CN intact, no focal deficit appreciated; gait was not evaluated  Labs on Admission:  Basic  Metabolic Panel:  Recent Labs Lab 07/13/13 0918 07/19/13 1206 07/19/13 1339 07/19/13 1437  NA 146* 156* 153*  --   K 3.3* 3.1* 2.9*  --   CL  --   --  113*  --   CO2 26 27 29   --   GLUCOSE 140 128 121*  --   BUN 29.7* 82.4* 81*  --   CREATININE 0.8 1.8* 1.79*  --   CALCIUM 8.7 9.6 9.3  --   MG  --   --   --  2.4   Liver Function Tests:  Recent Labs Lab 07/13/13 0918 07/19/13 1339  AST 16 10  ALT 17 12  ALKPHOS 74 51  BILITOT 2.84* 1.2  PROT 5.9* 6.1  ALBUMIN 3.5 3.5   CBC:  Recent Labs Lab 07/13/13 0918 07/19/13 1206 07/19/13 1339  WBC 2.9* 3.8* 3.8*  NEUTROABS 2.6 3.1 3.1  HGB 9.4* 7.7* 7.9*  HCT 27.0* 23.2* 23.0*  MCV 91.6 89.9 90.6  PLT 101* 8* 9*   Cardiac Enzymes:  Recent Labs Lab 07/19/13 1339  TROPONINI <0.30    EKG:  Date: 07/19/2013  Rate: 92  Rhythm: normal sinus rhythm  QRS Axis: normal  Intervals: QT prolonged  ST/T Wave abnormalities: normal  Conduction Disutrbances:none  Narrative Interpretation:  Old EKG Reviewed: unchanged  Assessment/Plan 1-Dehydration: secondary to acute radiation esophagitis and fungal infection. -will admit to telemetry given electrolytes abnormalities (hypokalemia) -will provide IVF's -maximize medications to be given IV as much as possible during admission in order to provide oral/esophageal rest -start tx with IV fluconazole and nystatin -IV protonix  -continue carafate  2-hypernatremia: will provide IVF's and supportive care. Secondary to dehydration  3-ARF: most recent Cr 07/13/13 was 0.8; now 1.79; appears to be pre-renal in nature due to dehydration, also 2/2 hypotension -will provide fluid resuscitation -Will hold antihypertensive drugs -will check UA  4-Lung cancer: per oncology. Actively receiving chemotherapy and radiation.  -continue oxygen supplementation  5-Hypokalemia: secondary to decrease PO intake -will replete and check Mg level.  6-Pancytopenia: secondary to chemotherapy. Will  transfuse i unit of platelets and will avoid heparin products  7-Thrush, oral: will treat with nystatin and IV fluconazole  8-Acute esophagitis: will use carafate and IV protonix  9-Glaucoma: continue eye drops (xalatan)  10-sciatica affecting RLE: planning to get Lumbar spine MRI as an outpatient and follow with orthopedic service for further evaluation/treatment.  DVT: SCD's  Oncology (Dr. Arbutus Ped)  Code Status: Full Family Communication: wife and daughter at bedside Disposition Plan: observation, LOs < 2 midnights; Telemetry  Time spent: 45 minutes  Cory [name] [facility] Pager [phone]  If 7PM-7AM, please contact night-coverage [email or web address] Password [email or web address] 07/19/2013, 6:35 PM

## 2013-07-19 NOTE — Addendum Note (Signed)
Encounter addended by: Eduardo Osier, RN on: 07/19/2013  2:55 PM<BR>     Documentation filed: Notes Section

## 2013-07-19 NOTE — Progress Notes (Signed)
The daughter of the patient notified the RN that the patient had taken some medications while she was in the room.  RN asked patient if she could see the medications,  and explained to the patient that the pharmacy has to verify the home medications before administration. The patient insisted that he had not taken any home medications and then refused all 2000 scheduled medications. The RN explained the purpose of the medications but the patient still refused to take the hospital medications.  The patient was found to be alert and oriented x 4 at time of this progress note.

## 2013-07-19 NOTE — Progress Notes (Signed)
Patient refused to wear the telemetry monitor.  PCP on call notified.

## 2013-07-19 NOTE — ED Notes (Signed)
Hospitalitis at bedside with pt and family

## 2013-07-20 ENCOUNTER — Telehealth: Payer: Self-pay | Admitting: Oncology

## 2013-07-20 ENCOUNTER — Telehealth: Payer: Self-pay | Admitting: *Deleted

## 2013-07-20 ENCOUNTER — Telehealth: Payer: Self-pay | Admitting: Pulmonary Disease

## 2013-07-20 ENCOUNTER — Encounter: Payer: Self-pay | Admitting: Specialist

## 2013-07-20 ENCOUNTER — Ambulatory Visit: Payer: Medicare Other

## 2013-07-20 ENCOUNTER — Other Ambulatory Visit: Payer: Medicare Other | Admitting: Lab

## 2013-07-20 LAB — TYPE AND SCREEN: Antibody Screen: NEGATIVE

## 2013-07-20 LAB — PREPARE PLATELET PHERESIS

## 2013-07-20 LAB — NO BLOOD PRODUCTS

## 2013-07-20 MED ORDER — MUSCLE RUB 10-15 % EX CREA
TOPICAL_CREAM | CUTANEOUS | Status: DC | PRN
  Filled 2013-07-20: qty 85

## 2013-07-20 NOTE — Progress Notes (Signed)
I talked with Mrs. Darko late this afternoon.  She reported that her husband had napped and seemed to be in an improved mood.  She was somewhat anxious in that she has not yet received a call from Mr. Caiazzo primary care physician.  I continued to offer support to her.

## 2013-07-20 NOTE — Progress Notes (Signed)
Patient refused the platelets. Patient did sign the "Refusal of Blood Products" form, this form is in the shadow chart.

## 2013-07-20 NOTE — Progress Notes (Signed)
Pt wants his IV out and has been wanting to go home and leave. Dr. Gwenlyn Perking  was notified.

## 2013-07-20 NOTE — Progress Notes (Signed)
Pt refused any staff to go in his room right now.

## 2013-07-20 NOTE — Telephone Encounter (Addendum)
Talked to Cory [name], Cory [name]'s wife, and let her know that Cory [name] will not be having radiation treatments today or tomorrow due to his low platelet count.  She said that she will tell him.

## 2013-07-20 NOTE — Telephone Encounter (Signed)
Spoke with patients wife-- Patient to ED from cancer center after receiving radiation therapy with complaints of severe dehydration in which Patient received some fluids  Patient c/o severe sore thrush that per patients wife "is probably as far down as into his trachea" Endoscopy wanted to be performed however patient refused Per spouse patient was irrational, hollering and yelling-- had to have police escort out-- patient left AMA Pt spouse states pts platelets are decreased and patient was near renal failure-- Labs are not good and patient will not receive radiation today or tomorrow b/c of this Unable to draw labs this morning because patient refused Spouse requesting to be seen by Dr. Kriste Basque today because "he is the only person he will see" Spouse expressed "she just does not know what to do" Dr. Kriste Basque please advise  Last OV: 07/05/13 no f/u scheduled at this time

## 2013-07-20 NOTE — Progress Notes (Signed)
I have talked with Mrs. Geron twice this morning.  She requested that I come to the hospital to provide support, as her husband was threatening to leave AMA.  By the time I got to the fifth floor, the patient had left.  Mrs. Villanueva then called me from home, saying she did not know what to do.  She had made a call to Dr. Jodelle Green office and was waiting for a return call.  I asked Mrs. Malstrom if her husband's reaction was consistent with his personality and reactions in the past.    She replied that he has always been a "control freak" but that this is different.  She said he normally did not seem angry in the way that he does at present.  I encouraged her to call 911 if she felt she needed immediate help in caring for her husband.  I gave her my contact information in the event she needed to speak again with me.  The couple's daughter was at the house and helping to attend to her father.

## 2013-07-20 NOTE — Care Management Note (Signed)
    Page 1 of 1   07/20/2013     11:40:21 AM   CARE MANAGEMENT NOTE 07/20/2013  Patient:  Cory [name], Cory [name]   Account Number:  [id number]  Date Initiated:  07/20/2013  Documentation initiated by:  Lanier Clam  Subjective/Objective Assessment:   ADMITTEDW/DEHYDRATION.AV:WUJWJ RENAL FAILURE.XB:JYNW CA.     Action/Plan:   FROM HOME W/SPOUSE.HAS PCP,PHARMACY.   Anticipated DC Date:  07/20/2013   Anticipated DC Plan:  AGAINST MEDICAL ADVICE      DC Planning Services  CM consult      Choice offered to / List presented to:             Status of service:  Completed, signed off Medicare Important Message given?   (If response is "NO", the following Medicare IM given date fields will be blank) Date Medicare IM given:   Date Additional Medicare IM given:    Discharge Disposition:  AGAINST MEDICAL ADVICE  Per UR Regulation:  Reviewed for med. necessity/level of care/duration of stay  If discussed at Long Length of Stay Meetings, dates discussed:    Comments:  07/20/13 Jeanluc Wegman RN,BSN NCM [phone] RECEIVED CALL FROM ATTENDING ABOUT STATUS,ATTENDING STATED PATIENT WANTS TO LEAVE AMA.WENT TO FLOOR, ATTENDING TALKING TO PATIENT'S SPOUSE ABOUT PATIENT HAVING THE RIGHT TO LEAVE AMA IF HE CHOOSES TO.SPOUSE IN A W/C WITH 02 BEING WHEELED BY CAREGIVER FEELS HE NEEDS TO STAY IN THE HOSPITAL.AFTER SEVERAL ATTEMPTS BY STAFF TO CONVINCE PATIENT TO STAY IN HOSPITAL, NO SUCCESS.SECURITY CONTACTED.PATIENT LEFT AMA,WHEELED TO ED TO WAIT FOR THEIR TRANSP.

## 2013-07-20 NOTE — Progress Notes (Addendum)
Patient refused to wear bilateral sequential compression device (SCDs). Will continue to monitor patient.

## 2013-07-20 NOTE — Progress Notes (Signed)
[facility] PROGRESS NOTE  Cory [name] ZOX:[id number] DOB: 1929/05/29 DOA: 07/19/2013 PCP: Michele Mcalpine, MD  Assessment/Plan: 1-Dehydration: secondary to acute radiation esophagitis and fungal infection.  -recommend to continue inpatient telemetry monitoring given electrolytes abnormalities (hypokalemia)  -continue IVF's -maximize medications to be given IV as much as possible during this admission in order to provide oral/esophageal rest and accelerate healing. -recommend continue tx with IV fluconazole and nystatin  -recommend continue treatment with IV protonix  -continue carafate  -patient threatening to leave AMA; family has been updated; in my opinion will benefit of further treatment inpatient if he allows it; Psych service contacted for formal evaluation regarding competency (patient appears to competent on my exam and knows what he wants). -needs to signs AMA papers if ending leaving against advice, discussed with patient and he understand consequences and risks; is now his responsibility and decision.  2-hypernatremia: IVF's given and supportive care provided. -patient now refusing  Treatment -Secondary to dehydration   3-ARF: most recent Cr 07/13/13 was 0.8; now 1.79; appears to be pre-renal in nature due to dehydration, also 2/2 hypotension  -BP improved -continue holding antihypertensive for now (as patient unable to swallow pills) -continue IVF's if he allows it, until able to tolerate more PO intake  4-Lung cancer: per oncology. Actively receiving chemotherapy and radiation.  -continue oxygen supplementation  -Dr. Arbutus Ped has seen patient.  5-Hypokalemia: secondary to decrease PO intake  -some repletion with IVF's given throughout the night; patient now refusing labwork and further treatment.  6-Pancytopenia: secondary to chemotherapy. Will transfuse i unit of platelets and will avoid heparin products   7-Thrush, oral: will treat with nystatin and IV fluconazole    8-Acute esophagitis: will continue carafate and IV protonix   9-Glaucoma: continue eye drops (xalatan)   10-sciatica affecting RLE: planning to get Lumbar spine MRI as an outpatient and follow with orthopedic service for further evaluation/treatment.   11-tobacco abuse: actively smoking until 3-4 days prior to admission. Nicotine patch offered and refused. He just want to go home.  DVT: SCD's   Code Status: Full Family Communication: daughter Cory [name]) and Wife Cory [name]) over the phone in front of patient after he asked me to talk with them Disposition Plan: continue treatment if he allow [place] to. definitely needs further inpatient treatment.   Consultants:  Oncology (Dr. Arbutus Ped)  Psych for competency (patient refusing treatment and new to me; family reports he is usually no so negative)  Procedures:  none  Antibiotics:  none  HPI/Subjective: Threatening to leave AMA, has refused labs in the morning; asked to have his IV removed. Overnight refused platelets transfusion. No fever. BP improved.  Objective: Filed Vitals:   07/20/13 0713  BP: 141/67  Pulse: 88  Temp: 98 F (36.7 C)  Resp: 16    Intake/Output Summary (Last 24 hours) at 07/20/13 0811 Last data filed at 07/19/13 2100  Gross per 24 hour  Intake    290 ml  Output     80 ml  Net    210 ml   Filed Weights   07/19/13 1832  Weight: 61.9 kg (136 lb 7.4 oz)    Exam:   General: AAOX3, threatening to leave AMA; afebrile.  Cardiovascular: denies CP; refuse to have heart auscultated  Respiratory: refuse to have lungs auscultated,  Abdomen: denies pain  Musculoskeletal: no edema appreciated, moves for limbs  Data Reviewed: Basic Metabolic Panel:  Recent Labs Lab 07/13/13 0918 07/19/13 1206 07/19/13 1339 07/19/13 1437  NA 146* 156* 153*  --  K 3.3* 3.1* 2.9*  --   CL  --   --  113*  --   CO2 26 27 29   --   GLUCOSE 140 128 121*  --   BUN 29.7* 82.4* 81*  --   CREATININE 0.8 1.8* 1.79*   --   CALCIUM 8.7 9.6 9.3  --   MG  --   --   --  2.4   Liver Function Tests:  Recent Labs Lab 07/13/13 0918 07/19/13 1339  AST 16 10  ALT 17 12  ALKPHOS 74 51  BILITOT 2.84* 1.2  PROT 5.9* 6.1  ALBUMIN 3.5 3.5   CBC:  Recent Labs Lab 07/13/13 0918 07/19/13 1206 07/19/13 1339  WBC 2.9* 3.8* 3.8*  NEUTROABS 2.6 3.1 3.1  HGB 9.4* 7.7* 7.9*  HCT 27.0* 23.2* 23.0*  MCV 91.6 89.9 90.6  PLT 101* 8* 9*   Cardiac Enzymes:  Recent Labs Lab 07/19/13 1339  TROPONINI <0.30    Studies: No results found.  Scheduled Meds: . fentaNYL  12.5 mcg Transdermal Q72H  . fluconazole (DIFLUCAN) IV  100 mg Intravenous Q24H  . latanoprost  1 drop Left Eye QHS  . nystatin  5 mL Oral QID  . pantoprazole (PROTONIX) IV  40 mg Intravenous Q24H  . sodium chloride  3 mL Intravenous Q12H   Continuous Infusions: . dextrose 5 % and 0.45 % NaCl with KCl 40 mEq/L 100 mL/hr at 07/20/13 0409    Principal Problem:   Dehydration Active Problems:   Lung cancer   Hypokalemia   ARF (acute renal failure)   Pancytopenia   Thrush, oral   Acute esophagitis   Glaucoma    Time spent: >30 minutes    Talis Iwan  [facility] Pager [phone]. If 7PM-7AM, please contact night-coverage at [email or web address], password [facility] 07/20/2013, 8:11 AM  LOS: 1 day

## 2013-07-20 NOTE — Discharge Summary (Signed)
Physician Discharge Summary  Cory [name]:[id number] DOB: 1929/09/25 DOA: 07/19/2013  PCP: Michele Mcalpine, MD  Admit date: 07/19/2013 Discharge date: 07/20/2013  Time spent: 20 minutes   Discharge Diagnoses:  Principal Problem:   Dehydration Active Problems:   Lung cancer   Hypokalemia   ARF (acute renal failure)   Pancytopenia   Thrush, oral   Acute esophagitis   Glaucoma   Discharge Condition: PATIENT LEFT AMA   Filed Weights   07/19/13 1832  Weight: 61.9 kg (136 lb 7.4 oz)    History of present illness:  79 y.o. male with pmh as mentioned below but significant for lung cancer with ongoing chemotherapy and radiation treatment. Came to ED from cancer center after receiving radiation therapy with complaints of severe dehydration, lightheadedness and decrease ability for PO intake. Patient found with diffuse oral thrush and is suffering of severe radiation esophagitis. Today's lab demonstrated ARF, pancytopenia (with platelets of 8), hypernatremia and hypokalemia. Patient referred to ED for hospital admission and further evaluation and treatment. Patient complaining of worsening sciatica and difficulty to eat, otherwise denies SOB, CP, diarrhea, dysuria, fever, chills, melena, hematemesis, hematochezia or any other complaints.   Hospital Course by problems: 1-Dehydration: secondary to acute radiation esophagitis and fungal infection.  -recommend to continue inpatient telemetry monitoring given electrolytes abnormalities (hypokalemia)  -continue IVF's  -maximize medications to be given IV as much as possible during this admission in order to provide oral/esophageal rest and accelerate healing.  -recommend continue tx with IV fluconazole and nystatin  -recommend continue treatment with IV protonix  -continue carafate  -patient threatening to leave AMA; family has been updated; in my opinion will benefit of further treatment inpatient if he allows it; Psych service contacted for  formal evaluation regarding competency (patient appears to competent on my exam and knows what he wants).  -needs to signs AMA papers if ending leaving against advice, discussed with patient and he understand consequences and risks; is now his responsibility and decision.  2-hypernatremia: IVF's given and supportive care provided.  -patient now refusing Treatment  -Secondary to dehydration  3-ARF: most recent Cr 07/13/13 was 0.8; now 1.79; appears to be pre-renal in nature due to dehydration, also 2/2 hypotension  -BP improved  -continue holding antihypertensive for now (as patient unable to swallow pills)  -continue IVF's if he allows it, until able to tolerate more PO intake  4-Lung cancer: per oncology. Actively receiving chemotherapy and radiation.  -continue oxygen supplementation  -Dr. Arbutus Ped has seen patient.  5-Hypokalemia: secondary to decrease PO intake  -some repletion with IVF's given throughout the night; patient now refusing labwork and further treatment.  6-Pancytopenia: secondary to chemotherapy. Will transfuse i unit of platelets and will avoid heparin products  7-Thrush, oral: will treat with nystatin and IV fluconazole  8-Acute esophagitis: will continue carafate and IV protonix  9-Glaucoma: continue eye drops (xalatan)  10-sciatica affecting RLE: planning to get Lumbar spine MRI as an outpatient and follow with orthopedic service for further evaluation/treatment.  11-tobacco abuse: actively smoking until 3-4 days prior to admission. Nicotine patch offered and refused. He just want to go home.  PATIENT LEFT AMA  Procedures:  none  Consultations:  oncology  Discharge Exam: Filed Vitals:   07/20/13 0713  BP: 141/67  Pulse: 88  Temp: 98 F (36.7 C)  Resp: 16   General: AAOX3, threatening to leave AMA; afebrile. Still with thrush on his mouth.  Cardiovascular: denies CP; refuse to have heart auscultated  Respiratory: refuse  to have lungs auscultated,   Abdomen: denies pain  Musculoskeletal: no edema appreciated, moves for limbs  Discharge Instructions   Future Appointments Provider Department Dept Phone   07/22/2013 10:10 AM [facility] [place] [facility] [phone]   07/23/2013 10:10 AM [facility] [place] [facility] [phone]   07/26/2013 10:10 AM [facility] [place] [facility] [phone]   07/27/2013 9:30 AM Krista Blue [facility] [phone]   07/27/2013 10:10 AM [facility] [phone]   07/27/2013 10:45 AM Jared, [place] [facility] [phone]   07/27/2013 11:30 AM [facility] [place] [facility] [phone]   07/27/2013 12:00 PM Anabel Bene, RD [facility] [phone]   07/28/2013 10:10 AM [facility] [place] [facility] [phone]   07/28/2013 11:00 AM [facility] [place] [facility] [phone]   07/29/2013 10:10 AM [facility] [place] [facility] [phone]   07/29/2013 11:00 AM [facility] [place] [facility] [phone]   07/30/2013 10:10 AM [facility] [place] [facility] [phone]   07/30/2013 10:30 AM [facility] Inj Nurse [place] [facility] [phone]   08/02/2013 10:10 AM [facility] [place] [facility] [phone]   08/02/2013 2:35 PM [facility] [place] [facility] [phone]   08/03/2013 10:10 AM [facility] [place] [facility] [phone]   08/03/2013 12:05 PM [facility] [place] [facility] [phone]        Medication List    ASK your doctor about these medications       acetaminophen 500 MG tablet  Commonly known as:  TYLENOL  Take 500 mg by mouth every 6 (six) hours as needed for pain.     amLODipine 5 MG tablet  Commonly known as:  NORVASC  TAKE ONE TABLET BY MOUTH ONE TIME DAILY     antiseptic oral rinse Liqd  15 mLs by Mouth Rinse route as needed.     aspirin 81 MG tablet  Take 81 mg by mouth daily.     emollient cream  Commonly known as:  BIAFINE  Apply topically 2 (two) times daily.     fentaNYL 12 MCG/HR  Commonly known as:  DURAGESIC - dosed mcg/hr  Place 1 patch (12.5 mcg total) onto the skin every 3 (three) days.     fluconazole 100 MG tablet  Commonly known as:  DIFLUCAN  Take 2 tabs today, then 1 tab daily for 20 more days for thrush     guaiFENesin 600 MG 12 hr tablet  Commonly known as:  MUCINEX  Take 1,200 mg by mouth daily.     hyaluronate sodium Gel  Apply topically 2 (two) times daily.     HYDROcodone-acetaminophen 7.5-325 mg/15 ml solution  Commonly known as:  HYCET  Take 15 mLs by mouth 4 (four) times daily as needed for pain.     latanoprost 0.005 % ophthalmic solution  Commonly known as:  XALATAN  Place 1 drop into the left eye at bedtime.     magic mouthwash w/lidocaine Soln  Take 5 mLs by mouth 4 (four) times daily as needed.     multivitamin capsule  Take 1 capsule  by mouth daily.     MYOFLEX EX  Apply 1 application topically 2 (two) times daily as needed. For pain  AS NEEDED     potassium chloride SA 20 MEQ tablet  Commonly known as:  KLOR-CON M20  Take 1 tablet (20 mEq total) by mouth 2 (two) times daily.     prochlorperazine 10 MG tablet  Commonly known as:  COMPAZINE  Take 1 tablet (10 mg total) by mouth every 6 (six) hours as needed.     sucralfate 1 G tablet  Commonly known as:  CARAFATE  Take 1 tablet (1 g total) by mouth 4 (four) times daily.     vitamin C 500 MG tablet  Commonly known as:  ASCORBIC ACID  Take 500 mg  by mouth daily.     Vitamin D 2000 UNITS Caps  Take 1 capsule by mouth daily.     ZYRTEC ALLERGY 10 MG Caps  Generic drug:  Cetirizine HCl  Take 1 capsule by mouth.       Allergies  Allergen Reactions  . Niacin     REACTION: Intol to Niacin w/ flushing in large doses      The results of significant diagnostics from this hospitalization (including imaging, microbiology, ancillary and laboratory) are listed below for reference.    Significant Diagnostic Studies: Ct Chest W Contrast  07/02/2013   *RADIOLOGY REPORT*  Clinical Data:  Small cell lung cancer.  Patient status post chemotherapy  CT CHEST, ABDOMEN AND PELVIS WITH CONTRAST  Technique:  Multidetector CT imaging of the chest, abdomen and pelvis was performed following the standard protocol during bolus administration of intravenous contrast.  Contrast: OMNIPAQUE IOHEXOL 300 MG/ML  SOLN  Comparison:  PET CT scan 05/24/2013, CT chest 05/03/2013  CT CHEST  Findings:  Compared to the CT chest of 05/03/2013, the large right paratracheal mass is decreased in size measuring 29 x 25 mm compared to 60 x 40 mm.  Likewise a right upper paratracheal node is decreased measuring 8 mm compared to 24 mm.  The subcarinal and right hilar nodes are also decreased in size and are not pathologic by size criteria.  Review of the lung parenchyma demonstrates a small nodule in the right middle lobe measuring 4 mm unchanged from prior.  The branching nodular disease in the right upper lobe described on prior exam has near completely resolved.  Trace nodularity remaining.  IMPRESSION:  1.  Marked reduction in volume of bulky mediastinal lymphadenopathy. 2.  Reduction in branching nodularity within the right upper lobe. 3.  No evidence of disease progression.  CT ABDOMEN AND PELVIS  Findings:  No focal hepatic lesion.  The gallbladder, pancreas, spleen, adrenal glands, and kidneys are unchanged.  Bilateral nonenhancing renal cysts.  The stomach, small bowel  and, and colon are unremarkable. Abdominal aorta normal caliber.  No retroperitoneal lymphadenopathy.  There are small sub centimeter periaortic lymph nodes which not changed from prior.  The prostate gland bladder normal.  Small bladder diverticulum on the left side of bladder.  No pelvic lymphadenopathy. Review of bone windows demonstrates no aggressive osseous lesions.  IMPRESSION:   No evidence metastasis in the abdomen or pelvis.   Original Report Authenticated By: Genevive Bi, M.D.   Ct Abdomen Pelvis W Contrast  07/02/2013   *RADIOLOGY REPORT*  Clinical Data:  Small cell lung cancer.  Patient status post chemotherapy  CT CHEST, ABDOMEN AND PELVIS WITH CONTRAST  Technique:  Multidetector CT imaging of the chest, abdomen and  pelvis was performed following the standard protocol during bolus administration of intravenous contrast.  Contrast: OMNIPAQUE IOHEXOL 300 MG/ML  SOLN  Comparison:  PET CT scan 05/24/2013, CT chest 05/03/2013  CT CHEST  Findings:  Compared to the CT chest of 05/03/2013, the large right paratracheal mass is decreased in size measuring 29 x 25 mm compared to 60 x 40 mm.  Likewise a right upper paratracheal node is decreased measuring 8 mm compared to 24 mm.  The subcarinal and right hilar nodes are also decreased in size and are not pathologic by size criteria.  Review of the lung parenchyma demonstrates a small nodule in the right middle lobe measuring 4 mm unchanged from prior.  The branching nodular disease in the right upper lobe described on prior exam has near completely resolved.  Trace nodularity remaining.  IMPRESSION:  1.  Marked reduction in volume of bulky mediastinal lymphadenopathy. 2.  Reduction in branching nodularity within the right upper lobe. 3.  No evidence of disease progression.  CT ABDOMEN AND PELVIS  Findings:  No focal hepatic lesion.  The gallbladder, pancreas, spleen, adrenal glands, and kidneys are unchanged.  Bilateral nonenhancing renal cysts.  The  stomach, small bowel and, and colon are unremarkable. Abdominal aorta normal caliber.  No retroperitoneal lymphadenopathy.  There are small sub centimeter periaortic lymph nodes which not changed from prior.  The prostate gland bladder normal.  Small bladder diverticulum on the left side of bladder.  No pelvic lymphadenopathy. Review of bone windows demonstrates no aggressive osseous lesions.  IMPRESSION:   No evidence metastasis in the abdomen or pelvis.   Original Report Authenticated By: Genevive Bi, M.D.   Labs: Basic Metabolic Panel:  Recent Labs Lab 07/19/13 1206 07/19/13 1339 07/19/13 1437  NA 156* 153*  --   K 3.1* 2.9*  --   CL  --  113*  --   CO2 27 29  --   GLUCOSE 128 121*  --   BUN 82.4* 81*  --   CREATININE 1.8* 1.79*  --   CALCIUM 9.6 9.3  --   MG  --   --  2.4   Liver Function Tests:  Recent Labs Lab 07/19/13 1339  AST 10  ALT 12  ALKPHOS 51  BILITOT 1.2  PROT 6.1  ALBUMIN 3.5   CBC:  Recent Labs Lab 07/19/13 1206 07/19/13 1339  WBC 3.8* 3.8*  NEUTROABS 3.1 3.1  HGB 7.7* 7.9*  HCT 23.2* 23.0*  MCV 89.9 90.6  PLT 8* 9*   Cardiac Enzymes:  Recent Labs Lab 07/19/13 1339  TROPONINI <0.30    Signed:  Noni Stonesifer  [facility] 07/20/2013, 9:57 AM

## 2013-07-20 NOTE — Progress Notes (Signed)
Cory [name] is an 79 years old white male with recently diagnosed with limited stage small cell lung cancer in 2014 and currently undergoing systemic chemotherapy with carboplatin and etoposide status post 2 cycles concurrent with radiation and he has significant improvement in his disease after cycle #2. The patient was seen at the radiation oncology Department yesterday and was found to have significant weakness and fatigue. Lab work ordered by Dr. Basilio Cairo showed low platelets count of 8000 with a hemoglobin of 7.7. Initial plan was to give the patient and platelet transfusion as well as PRBCs transfusion at the cancer Center. Basic metabolic panel later showed significant abnormalities including high sodium of 156, and potassium 3.1 with elevated BUN of 82.4 and elevated serum creatinine of 1.8.  The patient was advised to go immediately to the emergency Department for evaluation and possible admission. He the emergency department physician hard time and was refusing the admission initially. Because of his significant electrolyte abnormalities, the patient was admitted to telemetry bed to monitor his cardiac function.  Overnight the patient refused all types of treatment including platelet transfusion. He also refused lab to evaluate his electrolytes and blood count.  The patient took his IV off and insisted on leaving AMA.  Dr. Gwenlyn Perking [facility] died several time with the patient but he insisted to leave. I saw the patient this morning and had a lengthy discussion with him about his current condition and the serious consequences if he decided to leave AMA with his current low blood count and electrolyte abnormalities. The patient was still very angry and insisted on leaving. He is noncompliant with our recommendations and I think this patient may benefit from seeing another medical oncologist to take over his care as I have very hard times dealing with him at this point.  I will contact the  administration of the cancer Center to arrange for this patient to see a different physician. I will continue to cover his emergency care for now. I forward a copy of this report to his primary care physician Dr.Nadal.

## 2013-07-20 NOTE — Telephone Encounter (Signed)
Pt's wife calling again in ref to previous msg can be reached at [phone].Raylene Everts

## 2013-07-20 NOTE — Telephone Encounter (Signed)
Called patient to ask when he would like to have his tests, spoke with patient's wife -Chales Abrahams , and they will think about it and call me tomorrow and let me know.

## 2013-07-20 NOTE — Progress Notes (Signed)
Patient was awake. Patient refused to have labs drawn this am.

## 2013-07-20 NOTE — Progress Notes (Signed)
Patient refused orthostatic blood pressure. Will continue to monitor pt.

## 2013-07-20 NOTE — Plan of Care (Addendum)
Problem: Phase I Progression Outcomes Goal: Other Phase I Outcomes/Goals Outcome: Completed/Met Date Met:  07/20/13 Patient was given educational handouts on the following: Platelet transfusion,Fluconazole, Fentanyl patch,Protonix, Nystatin Patient read all the handouts and asked appropriate questions r/t the handouts

## 2013-07-20 NOTE — Telephone Encounter (Signed)
Late Entry 07/19/13.  Pt saw Dr Basilio Cairo in radiation.  She ordered CBC and BMET to be done.  CBC showed hbg 7.7, plt 8.  Per dr Donnald Garre, pt would need 2 units blood and 1 unit plts.  Spoke with Sherri Sear, RN in radiation.  Per Dr Basilio Cairo, she wanted to see if pt was hypokalemic and he would need some IVF with K.  BMET showed K 3.1, but also showed other electrolyte imbalances as well.  Dr Donnald Garre and Dr Basilio Cairo discussed lab work and determined that pt needed to report to the ED to be evaluated.  Per Sherri Sear, RN, Dr Basilio Cairo discussed pt needs to go to the ED with patient.  SLJ

## 2013-07-21 ENCOUNTER — Telehealth: Payer: Self-pay | Admitting: Oncology

## 2013-07-21 ENCOUNTER — Ambulatory Visit: Payer: Medicare Other

## 2013-07-21 ENCOUNTER — Other Ambulatory Visit: Payer: Self-pay | Admitting: Pulmonary Disease

## 2013-07-21 ENCOUNTER — Encounter (HOSPITAL_COMMUNITY): Payer: Self-pay | Admitting: Emergency Medicine

## 2013-07-21 ENCOUNTER — Inpatient Hospital Stay (HOSPITAL_COMMUNITY)
Admission: EM | Admit: 2013-07-21 | Discharge: 2013-07-22 | DRG: 682 | Disposition: A | Discharge status: Home / Self Care | Payer: Medicare Other | Attending: Internal Medicine | Admitting: Internal Medicine

## 2013-07-21 ENCOUNTER — Other Ambulatory Visit (INDEPENDENT_AMBULATORY_CARE_PROVIDER_SITE_OTHER): Payer: Medicare Other

## 2013-07-21 DIAGNOSIS — B029 Zoster without complications: Secondary | ICD-10-CM

## 2013-07-21 DIAGNOSIS — D126 Benign neoplasm of colon, unspecified: Secondary | ICD-10-CM

## 2013-07-21 DIAGNOSIS — R220 Localized swelling, mass and lump, head: Secondary | ICD-10-CM

## 2013-07-21 DIAGNOSIS — H409 Unspecified glaucoma: Secondary | ICD-10-CM

## 2013-07-21 DIAGNOSIS — Z85118 Personal history of other malignant neoplasm of bronchus and lung: Secondary | ICD-10-CM

## 2013-07-21 DIAGNOSIS — F17200 Nicotine dependence, unspecified, uncomplicated: Secondary | ICD-10-CM | POA: Diagnosis present

## 2013-07-21 DIAGNOSIS — D649 Anemia, unspecified: Secondary | ICD-10-CM

## 2013-07-21 DIAGNOSIS — D61818 Other pancytopenia: Secondary | ICD-10-CM

## 2013-07-21 DIAGNOSIS — N179 Acute kidney failure, unspecified: Principal | ICD-10-CM | POA: Diagnosis present

## 2013-07-21 DIAGNOSIS — M5430 Sciatica, unspecified side: Secondary | ICD-10-CM | POA: Diagnosis present

## 2013-07-21 DIAGNOSIS — Z87898 Personal history of other specified conditions: Secondary | ICD-10-CM

## 2013-07-21 DIAGNOSIS — R627 Adult failure to thrive: Secondary | ICD-10-CM | POA: Diagnosis present

## 2013-07-21 DIAGNOSIS — R252 Cramp and spasm: Secondary | ICD-10-CM

## 2013-07-21 DIAGNOSIS — B370 Candidal stomatitis: Secondary | ICD-10-CM

## 2013-07-21 DIAGNOSIS — I872 Venous insufficiency (chronic) (peripheral): Secondary | ICD-10-CM | POA: Diagnosis present

## 2013-07-21 DIAGNOSIS — E871 Hypo-osmolality and hyponatremia: Secondary | ICD-10-CM | POA: Diagnosis present

## 2013-07-21 DIAGNOSIS — R7309 Other abnormal glucose: Secondary | ICD-10-CM | POA: Diagnosis present

## 2013-07-21 DIAGNOSIS — Z85828 Personal history of other malignant neoplasm of skin: Secondary | ICD-10-CM

## 2013-07-21 DIAGNOSIS — K089 Disorder of teeth and supporting structures, unspecified: Secondary | ICD-10-CM

## 2013-07-21 DIAGNOSIS — E7800 Pure hypercholesterolemia, unspecified: Secondary | ICD-10-CM

## 2013-07-21 DIAGNOSIS — G4733 Obstructive sleep apnea (adult) (pediatric): Secondary | ICD-10-CM | POA: Diagnosis present

## 2013-07-21 DIAGNOSIS — J4489 Other specified chronic obstructive pulmonary disease: Secondary | ICD-10-CM | POA: Diagnosis present

## 2013-07-21 DIAGNOSIS — N400 Enlarged prostate without lower urinary tract symptoms: Secondary | ICD-10-CM | POA: Diagnosis present

## 2013-07-21 DIAGNOSIS — Y842 Radiological procedure and radiotherapy as the cause of abnormal reaction of the patient, or of later complication, without mention of misadventure at the time of the procedure: Secondary | ICD-10-CM | POA: Diagnosis present

## 2013-07-21 DIAGNOSIS — K208 Other esophagitis without bleeding: Secondary | ICD-10-CM | POA: Diagnosis present

## 2013-07-21 DIAGNOSIS — I10 Essential (primary) hypertension: Secondary | ICD-10-CM | POA: Diagnosis present

## 2013-07-21 DIAGNOSIS — D696 Thrombocytopenia, unspecified: Secondary | ICD-10-CM

## 2013-07-21 DIAGNOSIS — E870 Hyperosmolality and hypernatremia: Secondary | ICD-10-CM | POA: Diagnosis present

## 2013-07-21 DIAGNOSIS — E119 Type 2 diabetes mellitus without complications: Secondary | ICD-10-CM | POA: Diagnosis present

## 2013-07-21 DIAGNOSIS — T451X5A Adverse effect of antineoplastic and immunosuppressive drugs, initial encounter: Secondary | ICD-10-CM | POA: Diagnosis present

## 2013-07-21 DIAGNOSIS — M1990 Unspecified osteoarthritis, unspecified site: Secondary | ICD-10-CM

## 2013-07-21 DIAGNOSIS — J449 Chronic obstructive pulmonary disease, unspecified: Secondary | ICD-10-CM | POA: Diagnosis present

## 2013-07-21 DIAGNOSIS — C3490 Malignant neoplasm of unspecified part of unspecified bronchus or lung: Secondary | ICD-10-CM

## 2013-07-21 DIAGNOSIS — M545 Low back pain: Secondary | ICD-10-CM

## 2013-07-21 DIAGNOSIS — Z9221 Personal history of antineoplastic chemotherapy: Secondary | ICD-10-CM

## 2013-07-21 DIAGNOSIS — E860 Dehydration: Secondary | ICD-10-CM

## 2013-07-21 DIAGNOSIS — K5730 Diverticulosis of large intestine without perforation or abscess without bleeding: Secondary | ICD-10-CM

## 2013-07-21 DIAGNOSIS — G893 Neoplasm related pain (acute) (chronic): Secondary | ICD-10-CM | POA: Diagnosis present

## 2013-07-21 DIAGNOSIS — E876 Hypokalemia: Secondary | ICD-10-CM | POA: Diagnosis present

## 2013-07-21 DIAGNOSIS — H6120 Impacted cerumen, unspecified ear: Secondary | ICD-10-CM

## 2013-07-21 LAB — BASIC METABOLIC PANEL
Chloride: 120 mEq/L — ABNORMAL HIGH (ref 96–112)
Creatinine, Ser: 1.4 mg/dL (ref 0.4–1.5)

## 2013-07-21 LAB — HEPATIC FUNCTION PANEL
ALT: 17 U/L (ref 0–53)
Bilirubin, Direct: 0.3 mg/dL (ref 0.0–0.3)
Total Bilirubin: 1.1 mg/dL (ref 0.3–1.2)

## 2013-07-21 LAB — CBC WITH DIFFERENTIAL/PLATELET
Basophils Relative: 0 % (ref 0.0–3.0)
Eosinophils Absolute: 0 10*3/uL (ref 0.0–0.7)
Hemoglobin: 7.3 g/dL — CL (ref 13.0–17.0)
Lymphs Abs: 0.2 10*3/uL — ABNORMAL LOW (ref 0.7–4.0)
MCHC: 34.1 g/dL (ref 30.0–36.0)
MCV: 92.9 fl (ref 78.0–100.0)
Monocytes Absolute: 0.4 10*3/uL (ref 0.1–1.0)
Neutro Abs: 4.1 10*3/uL (ref 1.4–7.7)
Neutrophils Relative %: 86.2 % — ABNORMAL HIGH (ref 43.0–77.0)
RBC: 2.3 Mil/uL — ABNORMAL LOW (ref 4.22–5.81)

## 2013-07-21 MED ORDER — MUSCLE RUB 10-15 % EX CREA
TOPICAL_CREAM | Freq: Two times a day (BID) | CUTANEOUS | Status: DC | PRN
  Filled 2013-07-21: qty 85

## 2013-07-21 MED ORDER — FLUCONAZOLE 100 MG PO TABS
100.0000 mg | ORAL_TABLET | Freq: Every day | ORAL | Status: DC
  Filled 2013-07-21: qty 1

## 2013-07-21 MED ORDER — MAGIC MOUTHWASH W/LIDOCAINE
5.0000 mL | Freq: Four times a day (QID) | ORAL | Status: DC | PRN
  Filled 2013-07-21: qty 5

## 2013-07-21 MED ORDER — SODIUM CHLORIDE 0.9 % IV SOLN
INTRAVENOUS | Status: DC
  Administered 2013-07-21 – 2013-07-22 (×2): via INTRAVENOUS

## 2013-07-21 MED ORDER — HYDROCODONE-ACETAMINOPHEN 7.5-325 MG/15ML PO SOLN: 15.0000 mL | Freq: Four times a day (QID) | ORAL | Status: DC | PRN

## 2013-07-21 MED ORDER — ALBUTEROL SULFATE HFA 108 (90 BASE) MCG/ACT IN AERS: 1.0000 | INHALATION_SPRAY | Freq: Four times a day (QID) | RESPIRATORY_TRACT | Status: DC | PRN

## 2013-07-21 MED ORDER — LATANOPROST 0.005 % OP SOLN
1.0000 [drp] | Freq: Every day | OPHTHALMIC | Status: DC
  Filled 2013-07-21: qty 2.5

## 2013-07-21 MED ORDER — FENTANYL 12 MCG/HR TD PT72: 12.5000 ug | MEDICATED_PATCH | TRANSDERMAL | Status: DC

## 2013-07-21 MED ORDER — TROLAMINE SALICYLATE 10 % EX CREA
TOPICAL_CREAM | Freq: Two times a day (BID) | CUTANEOUS | Status: DC | PRN
  Filled 2013-07-21: qty 85

## 2013-07-21 MED ORDER — POTASSIUM CHLORIDE 10 MEQ/100ML IV SOLN
10.0000 meq | Freq: Once | INTRAVENOUS | Status: AC
  Administered 2013-07-21: 10 meq via INTRAVENOUS
  Filled 2013-07-21: qty 100

## 2013-07-21 MED ORDER — LATANOPROST 0.005 % OP SOLN: 1.0000 [drp] | Freq: Every day | OPHTHALMIC | Status: DC

## 2013-07-21 MED ORDER — AMLODIPINE BESYLATE 5 MG PO TABS
5.0000 mg | ORAL_TABLET | Freq: Every day | ORAL | Status: DC
  Administered 2013-07-21: 5 mg via ORAL
  Filled 2013-07-21 (×2): qty 1

## 2013-07-21 MED ORDER — GUAIFENESIN ER 600 MG PO TB12
1200.0000 mg | ORAL_TABLET | Freq: Every day | ORAL | Status: DC
  Filled 2013-07-21 (×2): qty 2

## 2013-07-21 MED ORDER — POTASSIUM CHLORIDE CRYS ER 20 MEQ PO TBCR
20.0000 meq | EXTENDED_RELEASE_TABLET | Freq: Two times a day (BID) | ORAL | Status: DC
  Administered 2013-07-21 – 2013-07-22 (×2): 20 meq via ORAL
  Filled 2013-07-21 (×3): qty 1

## 2013-07-21 MED ORDER — SODIUM CHLORIDE 0.9 % IV SOLN
INTRAVENOUS | Status: DC
  Administered 2013-07-21: 17:00:00 via INTRAVENOUS

## 2013-07-21 MED ORDER — SUCRALFATE 1 GM/10ML PO SUSP
1.0000 g | Freq: Four times a day (QID) | ORAL | Status: DC
  Filled 2013-07-21 (×6): qty 10

## 2013-07-21 MED ORDER — LATANOPROST 0.005 % OP SOLN
1.0000 [drp] | Freq: Every day | OPHTHALMIC | Status: DC
  Administered 2013-07-21: 1 [drp] via OPHTHALMIC
  Filled 2013-07-21: qty 2.5

## 2013-07-21 MED ORDER — PROCHLORPERAZINE MALEATE 10 MG PO TABS: 10.0000 mg | ORAL_TABLET | Freq: Four times a day (QID) | ORAL | Status: DC | PRN

## 2013-07-21 NOTE — ED Notes (Signed)
XBJ:[id number]<NF> Expected date:<BR> Expected time:<BR> Means of arrival:<BR> Comments:<BR> Hold for [place], [employer]

## 2013-07-21 NOTE — Telephone Encounter (Signed)
Received a message from Chales Abrahams stating that Jerime's labs came back from this morning and he will need fluids.  His platelets were 14.  They are on their way to the ED.

## 2013-07-21 NOTE — ED Notes (Signed)
Pt has small cell lung cancer. Pt states he was seen this morning by Dr. Kriste Basque and has had abnormal blood results of Na 153, K 3.2, hbg 7.9, platelets 14.0. Pt reports his last chemotherapy was two weeks ago and last radiation was on Monday. Dr. Kriste Basque referred the patient for possible blood transfusion, IV fluids, and potassium. Family reports the patient has not been eating drinking normally over the past several days due to severe thrush and esophagitis.

## 2013-07-21 NOTE — Telephone Encounter (Signed)
SN visit for SOC following hospital stay for viral pneumonia and acute renal failure. She started hemodialysis while at hospital. She has a central line in right chest managed by dialysis. Dressing is D/I. a fistula was placed in left arm, but incision dehisced. It is open above her elbow and near her axilla. It has copius amounts of clear yellow drainage. Wound care orders are to paint dry areas with betadine and cover wet area with calcium alginate silver. Caregivers were not provided wound care orders and have been applying santyl and solosite to entire wound.the two open areas are filled with wet fibrin. Instructed daughter on current wound care orders, she video'd wound care so she can show her sister in law thats does the wound care. Patient is not sleeping well at night and is lethargic during todays visit. She is not eating  well. She has hemodialysis on monday, wednesday and friday. Daughter prefers SN visit on these days, they leave by noon. She prefers PT and OT on tuesdays and thursdays so patient feels well enough to participate. Prior to this hospitalization patient lived in her own home and was independent. She is currently bedfeast and it takes 2 people to transfer her into wheelchair. Instructed that patient will recieve med list from agency and to take it with to all appointments Admission paperwork completed and signed. Patient instructed on grievance procedure, on call procedure, when and how to reach agency vs calling 911, frequency and duration of services. Patient verbalized understanding. Insurance verified, Patient has no financial responsibility. Patient verbalized understanding. Blood sugar was 146 this morning.

## 2013-07-21 NOTE — Telephone Encounter (Signed)
Called and spoke to Cory [name], Cory [name]'s wife.  She stated that Cory [name] is on his way to get labs drawn for Dr. Kriste Basque.  She said that he will probably be readmitted to the hospital.  Told her I would check back later in the day to see about radiation treatment tomorrow.

## 2013-07-21 NOTE — ED Provider Notes (Signed)
CSN: [id number]     Arrival date & time 07/21/13  1534 History     First MD Initiated Contact with Patient 07/21/13 1559     Chief Complaint  Patient presents with  . Abnormal Lab   (Consider location/radiation/quality/duration/timing/severity/associated sxs/prior Treatment) HPI Pt with history of small cell lung CA undergoing chem/radiation recently admitted for pancytopenia, dehydration and esophagitis and then left AMA. Seen by Dr Kriste Basque today with repeat labs that showed worsening anemia and persistent signs fo dehydration referred to ED for admission for transfusion and IV hydration. Pt with persistent fatigue and decreased PO intake though improved on diflucan. Pt has long term sciatica-type pain and cont to c/o R posterior leg pain radiating from buttock that he believes is due to the radiation.  Past Medical History  Diagnosis Date  . OSA (obstructive sleep apnea)   . COPD (chronic obstructive pulmonary disease)   . Cigarette smoker   . Hypertension   . Venous insufficiency   . Hypercholesteremia   . Borderline diabetes mellitus   . Diverticulosis of colon   . Hx of colonic polyps   . BPH (benign prostatic hyperplasia)   . DJD (degenerative joint disease)   . Lumbar back pain   . Leg cramps   . History of skin cancer   . History of shingles   . History of chemotherapy 05/25/2013    carboplatin, etoposide, neulasta every 3 weeks    Past Surgical History  Procedure Laterality Date  . Appendectomy    . Tonsillectomy and adenoidectomy    . Left inguinal hernia repair  1998    Dr. Earlene Plater  . Lumbar laminectomy  1997    Dr. Roxan Hockey  . Lymph node biopsy  05/06/2013   Family History  Problem Relation Age of Onset  . Cervical cancer Mother   . Breast cancer Sister   . Prostate cancer Father    History  Substance Use Topics  . Smoking status: Current Every Day Smoker -- 1.00 packs/day for 60 years    Types: Cigarettes  . Smokeless tobacco: Never Used  . Alcohol Use: No      Comment: rare use    Review of Systems  Constitutional: Positive for appetite change and fatigue. Negative for fever and chills.  Respiratory: Positive for cough. Negative for shortness of breath.   Cardiovascular: Negative for chest pain.  Gastrointestinal: Negative for nausea, vomiting and abdominal pain.  Musculoskeletal: Negative for myalgias.  Skin: Negative for rash and wound.  Neurological: Positive for weakness (generalized). Negative for dizziness, light-headedness and numbness.  All other systems reviewed and are negative.    Allergies  Niacin  Home Medications   Current Outpatient Rx  Name  Route  Sig  Dispense  Refill  . acetaminophen (TYLENOL) 500 MG tablet   Oral   Take 500 mg by mouth every 6 (six) hours as needed for pain.         Marland Kitchen Alum & Mag Hydroxide-Simeth (MAGIC MOUTHWASH W/LIDOCAINE) SOLN   Oral   Take 5 mLs by mouth 4 (four) times daily as needed.   250 mL   1   . amLODipine (NORVASC) 5 MG tablet      TAKE ONE TABLET BY MOUTH ONE TIME DAILY   90 tablet   0     Pt needs ov with SN for further refills.   Marland Kitchen antiseptic oral rinse (BIOTENE) LIQD   Mouth Rinse   15 mLs by Mouth Rinse route as needed.         Marland Kitchen  aspirin 81 MG tablet   Oral   Take 81 mg by mouth daily.           . Cetirizine HCl (ZYRTEC ALLERGY) 10 MG CAPS   Oral   Take 1 capsule by mouth.         . Cholecalciferol (VITAMIN D) 2000 UNITS CAPS   Oral   Take 1 capsule by mouth daily.          Marland Kitchen emollient (BIAFINE) cream   Topical   Apply topically 2 (two) times daily.         . fentaNYL (DURAGESIC - DOSED MCG/HR) 12 MCG/HR   Transdermal   Place 1 patch (12.5 mcg total) onto the skin every 3 (three) days.   5 patch   0   . fluconazole (DIFLUCAN) 100 MG tablet      Take 2 tabs today, then 1 tab daily for 20 more days for thrush   22 tablet   0   . guaiFENesin (MUCINEX) 600 MG 12 hr tablet   Oral   Take 1,200 mg by mouth daily.         Marland Kitchen  HYDROcodone-acetaminophen (HYCET) 7.5-325 mg/15 ml solution   Oral   Take 15 mLs by mouth 4 (four) times daily as needed for pain.   473 mL   0   . latanoprost (XALATAN) 0.005 % ophthalmic solution   Left Eye   Place 1 drop into the left eye at bedtime.           . Multiple Vitamin (MULTIVITAMIN) capsule   Oral   Take 1 capsule by mouth daily.           . potassium chloride SA (KLOR-CON M20) 20 MEQ tablet   Oral   Take 1 tablet (20 mEq total) by mouth 2 (two) times daily.   180 tablet   3     Pt needs ov with Dr. Kriste Basque for further refills.   . prochlorperazine (COMPAZINE) 10 MG tablet   Oral   Take 1 tablet (10 mg total) by mouth every 6 (six) hours as needed.   60 tablet   0   . sucralfate (CARAFATE) 1 GM/10ML suspension   Oral   Take 1 g by mouth 4 (four) times daily.         Terrance Mass Salicylate (MYOFLEX EX)   Apply externally   Apply 1 application topically 2 (two) times daily as needed. For pain  AS NEEDED         . hyaluronate sodium (RADIAPLEXRX) GEL   Topical   Apply topically 2 (two) times daily.          BP 109/67  Pulse 106  Temp(Src) 98.4 F (36.9 C) (Oral)  Resp 18  SpO2 100% Physical Exam  Nursing note and vitals reviewed. Constitutional: He is oriented to person, place, and time. He appears well-developed and well-nourished. No distress.  HENT:  Head: Normocephalic and atraumatic.  Mouth/Throat: Oropharynx is clear and moist.  Dry MM  Eyes: EOM are normal. Pupils are equal, round, and reactive to light.  Neck: Normal range of motion. Neck supple.  Cardiovascular: Normal rate and regular rhythm.   Pulmonary/Chest: Effort normal. No respiratory distress. He has no wheezes. He has rales (few scattered rales). He exhibits no tenderness.  Abdominal: Soft. Bowel sounds are normal. He exhibits no distension and no mass. There is no tenderness. There is no rebound and no guarding.  Musculoskeletal: Normal range of motion. He  exhibits no  edema and no tenderness.  Neurological: He is alert and oriented to person, place, and time.  5/5 motor in all ext, sensation intact  Skin: Skin is warm and dry. No rash noted. No erythema.  Psychiatric: He has a normal mood and affect. His behavior is normal.    ED Course   Procedures (including critical care time)  Labs Reviewed  TYPE AND SCREEN  PREPARE RBC (CROSSMATCH)   No results found. 1. Dehydration   2. Anemia   3. Thrombocytopenia     MDM  Discussed with [facility]. Will admit to med-surg bed  Loren Racer, MD 07/21/13 [phone]

## 2013-07-21 NOTE — ED Notes (Signed)
I just reported to [place], RN on [address]  He is taken to floor after K+ IV infusion begun.

## 2013-07-21 NOTE — H&P (Signed)
[facility] History and Physical  Cory [name] WJX:[id number] DOB: 11-09-1929 DOA: 07/21/2013  Referring physician: Ranae Palms PCP: Michele Mcalpine, MD  Specialists: Oncology  Chief Complaint: FTT, Weak  HPI: Cory [name] is a 79 y.o. male known OSA, COPD, limited SCLC 2014 currently undergoing systemic chemotherapy with carboplatin and etoposide status post 2 cycles concurrent with radiation to left AGAINST MEDICAL ADVICE 07/20/2013 was seen at his private care physician's office today and had multiple metabolic, hematologic, anomalies, and was referred back to the emergency room for admission. Patient states that he left the hospital because of a misunderstanding wherein he was thought to be another patient with a wrong chart-he also states that he didn't feel he was getting what he him at 2 the best treatment in terms of therapeutics-he has no specific complaints other than the sciatic nerve pain down the right leg, and is severely dehydrated, and isn't able to swallow, as well as he used to secondary to radiation esophagitis/thrush. Denies fever or chills, nausea, or vomiting, chest pain, blurred vision, double vision, hematochezia, melena, nausea, vomiting, sputum He states that he is willing to stay now and is apologetic for his behavior yesterday   Labs revealed sodium 153, chloride 120 potassium 3.2, BUN 46, creatinine 1.4 [baseline 20/0.7] Hemoglobin 7.3, platelets 14--baseline 10.7, 220 respectively 07/06/2013   Review of systems-see above  Past Medical History  Diagnosis Date  . OSA (obstructive sleep apnea)   . COPD (chronic obstructive pulmonary disease)   . Cigarette smoker   . Hypertension   . Venous insufficiency   . Hypercholesteremia   . Borderline diabetes mellitus   . Diverticulosis of colon   . Hx of colonic polyps   . BPH (benign prostatic hyperplasia)   . DJD (degenerative joint disease)   . Lumbar back pain   . Leg cramps   . History of skin cancer   .  History of shingles   . History of chemotherapy 05/25/2013    carboplatin, etoposide, neulasta every 3 weeks    Past Surgical History  Procedure Laterality Date  . Appendectomy    . Tonsillectomy and adenoidectomy    . Left inguinal hernia repair  1998    Dr. Earlene Plater  . Lumbar laminectomy  1997    Dr. Roxan Hockey  . Lymph node biopsy  05/06/2013   Social History:  reports that he has been smoking Cigarettes.  He has a 60 pack-year smoking history. He has never used smokeless tobacco. He reports that he does not drink alcohol or use illicit drugs.   Allergies  Allergen Reactions  . Niacin     REACTION: Intol to Niacin w/ flushing in large doses    Family History  Problem Relation Age of Onset  . Cervical cancer Mother   . Breast cancer Sister   . Prostate cancer Father    Prior to Admission medications   Medication Sig Start Date End Date Taking? Authorizing Provider  acetaminophen (TYLENOL) 500 MG tablet Take 500 mg by mouth every 6 (six) hours as needed for pain.   Yes Historical Provider, MD  Alum & Mag Hydroxide-Simeth (MAGIC MOUTHWASH W/LIDOCAINE) SOLN Take 5 mLs by mouth 4 (four) times daily as needed. 07/09/13  Yes Jonna Coup, MD  amLODipine (NORVASC) 5 MG tablet TAKE ONE TABLET BY MOUTH ONE TIME DAILY 04/09/13  Yes Michele Mcalpine, MD  antiseptic oral rinse (BIOTENE) LIQD 15 mLs by Mouth Rinse route as needed.   Yes Historical Provider, MD  aspirin  81 MG tablet Take 81 mg by mouth daily.     Yes Historical Provider, MD  Cetirizine HCl (ZYRTEC ALLERGY) 10 MG CAPS Take 1 capsule by mouth.   Yes Historical Provider, MD  Cholecalciferol (VITAMIN D) 2000 UNITS CAPS Take 1 capsule by mouth daily.    Yes Historical Provider, MD  emollient (BIAFINE) cream Apply topically 2 (two) times daily.   Yes Historical Provider, MD  fentaNYL (DURAGESIC - DOSED MCG/HR) 12 MCG/HR Place 1 patch (12.5 mcg total) onto the skin every 3 (three) days. 07/19/13  Yes Lonie Peak, MD  fluconazole  (DIFLUCAN) 100 MG tablet Take 2 tabs today, then 1 tab daily for 20 more days for thrush 07/19/13  Yes Lonie Peak, MD  guaiFENesin (MUCINEX) 600 MG 12 hr tablet Take 1,200 mg by mouth daily.   Yes Historical Provider, MD  HYDROcodone-acetaminophen (HYCET) 7.5-325 mg/15 ml solution Take 15 mLs by mouth 4 (four) times daily as needed for pain. 07/14/13  Yes Billie Lade, MD  latanoprost (XALATAN) 0.005 % ophthalmic solution Place 1 drop into the left eye at bedtime.     Yes Historical Provider, MD  Multiple Vitamin (MULTIVITAMIN) capsule Take 1 capsule by mouth daily.     Yes Historical Provider, MD  potassium chloride SA (KLOR-CON M20) 20 MEQ tablet Take 1 tablet (20 mEq total) by mouth 2 (two) times daily. 08/06/12 08/06/13 Yes Michele Mcalpine, MD  prochlorperazine (COMPAZINE) 10 MG tablet Take 1 tablet (10 mg total) by mouth every 6 (six) hours as needed. 05/25/13  Yes Si Gaul, MD  sucralfate (CARAFATE) 1 GM/10ML suspension Take 1 g by mouth 4 (four) times daily.   Yes Historical Provider, MD  Trolamine Salicylate (MYOFLEX EX) Apply 1 application topically 2 (two) times daily as needed. For pain  AS NEEDED   Yes Historical Provider, MD  hyaluronate sodium (RADIAPLEXRX) GEL Apply topically 2 (two) times daily.    Historical Provider, MD   Physical Exam: Filed Vitals:   07/21/13 1553  BP: 109/67  Pulse: 106  Temp: 98.4 F (36.9 C)  Resp: 18     General:  Alert, frail Caucasian male, temporal wasting  Eyes: EOMI, NCAT  ENT: Soft, supple, and skin over neck, telangiectasia over cheeks  Neck: Soft, supple, no thyromegaly  Cardiovascular: S1, S2 no murmur, rub, or gallop  Respiratory: Clinically clear  Abdomen: Soft, nontender, nondistended. No rebound  Skin: See above-no Largent edema  Musculoskeletal: Range of motion intact  Psychiatric: Euthymic  Neurologic: Grossly intact moving all 4 limbs equally  Labs on Admission:  Basic Metabolic Panel:  Recent Labs Lab  07/19/13 1206 07/19/13 1339 07/19/13 1437 07/21/13 0940  NA 156* 153*  --  153 Repeated and verified X2.*  K 3.1* 2.9*  --  3.2*  CL  --  113*  --  120 Repeated and verified X2.*  CO2 27 29  --  27  GLUCOSE 128 121*  --  156*  BUN 82.4* 81*  --  46*  CREATININE 1.8* 1.79*  --  1.4  CALCIUM 9.6 9.3  --  9.1  MG  --   --  2.4  --    Liver Function Tests:  Recent Labs Lab 07/19/13 1339 07/21/13 0940  AST 10 15  ALT 12 17  ALKPHOS 51 48  BILITOT 1.2 1.1  PROT 6.1 5.7*  ALBUMIN 3.5 3.5   No results found for this basename: LIPASE, AMYLASE,  in the last 168 hours No results found for  this basename: AMMONIA,  in the last 168 hours CBC:  Recent Labs Lab 07/19/13 1206 07/19/13 1339 07/21/13 0940  WBC 3.8* 3.8* 4.7  NEUTROABS 3.1 3.1 4.1  HGB 7.7* 7.9* 7.3 Repeated and verified X2.*  HCT 23.2* 23.0* 21.4 Repeated and verified X2.*  MCV 89.9 90.6 92.9  PLT 8* 9* 14.0 Repeated and verified X2.*   Cardiac Enzymes:  Recent Labs Lab 07/19/13 1339  TROPONINI <0.30    BNP (last 3 results) No results found for this basename: PROBNP,  in the last 8760 hours CBG: No results found for this basename: GLUCAP,  in the last 168 hours  Radiological Exams on Admission: No results found.  EKG: Independently reviewed. None performed  Assessment/Plan Active Problems:   OBSTRUCTIVE SLEEP APNEA   HYPERTENSION   VENOUS INSUFFICIENCY   DIABETES MELLITUS, BORDERLINE   ARF (acute renal failure)   Pancytopenia   Acute esophagitis   1. Acute hypovolemic hypornatremia-probably secondary to poor by mouth intake over past couple of days secondary to esophagitis, free water deficit= 3.4 L. Correcting over 16 hours at 0.5 mEq/liter, would equal a rate of about 125 cc per hour. I will start this correction slower at around 100 cc per hour.  Monitor vitals, get orthostatics. 2. Relative pancytopenia-cause almost certainly related to chemotherapy-high threshold to consider this is a GI  bleed. patient will be transfused 2 units packed red blood cells-his baseline is around 10-12. We will repeat labs in the morning. I doubt he is hemolyzing so no further workup at this time-platelets have come up to 14.0 will hold his aspirin 81 mg for now as he has no CAD or CVA. History 3. Non-small cell lung cancer-management per oncologist, Dr. Mackie Pai also has been seen by radiation oncology. I spoke to Dr. Shirline Frees today and he confirms that he will see the patient. Outpatient setting once his multiple other issues are resolved 4. COPD cold, stage 2-3-continue guaifenesin 1.2 g daily-curiously is not on either albuterol or anticholinergic. We'll place when necessary orders 5. Esophagitis/thrush-continue oral Diflucan 100 mg daily for the next 20 days-continue Carafate and IV Protonix 6. Cancer related pain-continue fentanyl patch 12.5 mcg every 72 hours, hydrocodone solution 7. Hypokalemia-replacing IV with potassium 10 meq and then continue regular other supplementation 8. Tobacco abuse-patient will need to be counseled regarding the same. 9. Obstructive sleep apnea-not on CPAP 10. sciatica-potentially would benefit from MRI as an outpatient-consider gabapentin. 11. Glaucoma continue Xalatan eyedrops  Discussed case briefly with Dr. Shirline Frees of oncology who will see him in the outpatient setting   Code Status:  full   Family Communication:  wife daughter at bedside   Disposition Plan:  inpatient MedSurg    Time spent: 8  Mahala Menghini [facility] Pager [phone]  If 7PM-7AM, please contact night-coverage [email or web address] Password [facility] 07/21/2013, 4:42 PM

## 2013-07-21 NOTE — Telephone Encounter (Signed)
SN called and spoke with pt and his wife and they are aware of SN recs.  Nothing further is needed.

## 2013-07-22 ENCOUNTER — Telehealth: Payer: Self-pay | Admitting: Oncology

## 2013-07-22 ENCOUNTER — Encounter: Payer: Self-pay | Admitting: Specialist

## 2013-07-22 ENCOUNTER — Telehealth: Payer: Self-pay | Admitting: Medical Oncology

## 2013-07-22 ENCOUNTER — Telehealth: Payer: Self-pay | Admitting: Pulmonary Disease

## 2013-07-22 ENCOUNTER — Ambulatory Visit: Payer: Medicare Other

## 2013-07-22 DIAGNOSIS — D649 Anemia, unspecified: Secondary | ICD-10-CM

## 2013-07-22 DIAGNOSIS — C3490 Malignant neoplasm of unspecified part of unspecified bronchus or lung: Secondary | ICD-10-CM

## 2013-07-22 DIAGNOSIS — N179 Acute kidney failure, unspecified: Principal | ICD-10-CM

## 2013-07-22 DIAGNOSIS — M545 Low back pain: Secondary | ICD-10-CM

## 2013-07-22 DIAGNOSIS — M5430 Sciatica, unspecified side: Secondary | ICD-10-CM

## 2013-07-22 DIAGNOSIS — D61818 Other pancytopenia: Secondary | ICD-10-CM

## 2013-07-22 DIAGNOSIS — D696 Thrombocytopenia, unspecified: Secondary | ICD-10-CM

## 2013-07-22 LAB — TYPE AND SCREEN
ABO/RH(D): B POS
Unit division: 0

## 2013-07-22 LAB — CBC
Hemoglobin: 9.7 g/dL — ABNORMAL LOW (ref 13.0–17.0)
MCH: 30.5 pg (ref 26.0–34.0)
MCV: 90.3 fL (ref 78.0–100.0)
RBC: 3.18 MIL/uL — ABNORMAL LOW (ref 4.22–5.81)

## 2013-07-22 LAB — COMPREHENSIVE METABOLIC PANEL
AST: 13 U/L (ref 0–37)
Albumin: 3.4 g/dL — ABNORMAL LOW (ref 3.5–5.2)
Calcium: 8.9 mg/dL (ref 8.4–10.5)
Creatinine, Ser: 0.99 mg/dL (ref 0.50–1.35)

## 2013-07-22 LAB — GLUCOSE, CAPILLARY: Glucose-Capillary: 107 mg/dL — ABNORMAL HIGH (ref 70–99)

## 2013-07-22 MED ORDER — NICOTINE 7 MG/24HR TD PT24
7.0000 mg | MEDICATED_PATCH | Freq: Every day | TRANSDERMAL | Status: DC
  Filled 2013-07-22: qty 1

## 2013-07-22 MED ORDER — POTASSIUM CHLORIDE 20 MEQ/15ML (10%) PO LIQD
20.0000 meq | Freq: Once | ORAL | Status: AC
  Administered 2013-07-22: 20 meq via ORAL
  Filled 2013-07-22: qty 15

## 2013-07-22 MED ORDER — CAMPHOR-MENTHOL 0.5-0.5 % EX LOTN
TOPICAL_LOTION | CUTANEOUS | Status: DC | PRN
  Filled 2013-07-22: qty 222

## 2013-07-22 NOTE — Telephone Encounter (Signed)
Per Dr Arbutus Ped I left message for Patty to call Dr Kriste Basque for concerns about discharge from hospital .

## 2013-07-22 NOTE — Care Management Note (Signed)
    Page 1 of 1   07/22/2013     5:19:12 PM   CARE MANAGEMENT NOTE 07/22/2013  Patient:  Cory [name], Cory [name]   Account Number:  [id number]  Date Initiated:  07/22/2013  Documentation initiated by:  Colleen Can  Subjective/Objective Assessment:   DX ARF, LUNG CANCER     Action/Plan:   CM SPOKE WITH PATIENT AND SPOUSE. pLANS ARE FOR HIM TO RETURN TO HIS HOME IN [place]. hE ALREADY HAS rw AND CANE. THERE ARE NO HH NEEDS OR ORDERS.   Anticipated DC Date:  07/22/2013   Anticipated DC Plan:  HOME/SELF CARE      DC Planning Services  CM consult      Choice offered to / List presented to:             Status of service:  Completed, signed off Medicare Important Message given?   (If response is "NO", the following Medicare IM given date fields will be blank) Date Medicare IM given:   Date Additional Medicare IM given:    Discharge Disposition:  HOME/SELF CARE  Per UR Regulation:  Reviewed for med. necessity/level of care/duration of stay  If discussed at Long Length of Stay Meetings, dates discussed:    Comments:  07/22/2013 Colleen Can BSN RN CCM [phone] List of private duty (self pay) caregivers given to pt's spouse for use as needed. Pt and spouse thanked CM for list. Pt anxious to be discharged to home.

## 2013-07-22 NOTE — Telephone Encounter (Signed)
I spoke with spouse. I advised her SN is not in any next week. TP is not here until WED and then she has no 30 min appt slots. Spouse is requesting pt to be worked in the following week with SN for HFU. Please advise Leigh thanks

## 2013-07-22 NOTE — Discharge Summary (Signed)
Physician Discharge Summary  Cory [name] ZOX:[id number] DOB: 1929/04/01 DOA: 07/21/2013  PCP: Michele Mcalpine, MD  Admit date: 07/21/2013 Discharge date: 07/22/2013  Recommendations for Outpatient Follow-up:  1. Patient was instructed to follow up with radiation oncology tomorrow Friday 8/8 for radiation treatment. Also, I have talked to him extensively about low platelet count and high sodium level. I have spoken with his family (wife and daughter too) and recommended to follow up in cancer center either tomorrow of Friday to recheck CBC and BMP.  2. There are lot of family issues going on. Patient desires more than anything to go home and he has a capacity to make this decision. He told me he wants radiation treatments to be spaced apart as he has significant burning on his neck and dysphagia. I have agreed with him and told him it is ultimately his choice to decide how much treatment he wants if he wants it at all. Doctors can advise but patients decide. His family however pushes on him to have all the possible treatment and has a hard time accepting patient's feelings about the treatment. The daughter has requested psychiatry consult on grounds that the patient is aggressive but he really has not been aggressive with anyone while on this unit. He has drank fluids as instructed, has taken his potassium supplement and said he feels great and he wishes to go home. His daughter has insisted he is not mentally fine but based on my assessment and nursing staff observation patient is mentally coherent, knows what is going on and ultimately has right to his treatment plan. This all has been communicated to the patient and his family. It is a sad situation as well as in patient's own words: "doctor, this is the most embarrassing situation that I have to argue with my own family to go home in front of you, my own family does not want me and now even claims i am going crazy. I'm not crazy all I want is to be at my own  home, that's it, is that too much to ask"."   Discharge Diagnoses:  Principal Problem:   ARF (acute renal failure) Active Problems:   OBSTRUCTIVE SLEEP APNEA   HYPERTENSION   VENOUS INSUFFICIENCY   DIABETES MELLITUS, BORDERLINE   Pancytopenia   Acute esophagitis    Discharge Condition: medically stable for discahrge  Diet recommendation: as tolerated  History of present illness:  79 y.o. male known OSA, COPD, limited SCLC 2014 currently undergoing systemic chemotherapy with carboplatin and etoposide status post 2 cycles concurrent with radiation to left AGAINST MEDICAL ADVICE 07/20/2013 was seen at his private care physician's office and had multiple metabolic, hematologic, anomalies, and was referred back to the emergency room for admission. In addition, patient complains of difficulty swallowing and pain in the neck area. In ED, BP was 109/67, HR 78 and T 98.5 F, O2 saturation was 97% on room air. CBC revealed hemoglobin of 7.3 and platelet count of 9. BMP revealed sodium of 153, potassium of 3.2 and normal kidney function.  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 - we have continued fluconazole and added magic mouthwash with lidocaine - patient has drank fluids and ate a fruit cup and had no subsequent problems Active Problems:   Anemia of chronic disease - has received blood transfusion, 1 unit and hemoglobin is 9.7 prior to discharge   Thrombocytopenia - platelets have improved from 9 to 14 to  17 without the transfusion   Hypokalemia - supplemented prior to discharge   Hyponatremia - sodium 153 on the admission - this is likely due to dehydration - encourage PO intake   Small cell lung cancer  - radiation treatment as planned  Procedures:  None   Consultations:  Oncology (Dr. Arbutus Ped)  Discharge Exam: Filed Vitals:   07/22/13 1325  BP: 138/63  Pulse: 99  Temp: 98 F (36.7 C)  Resp: 20   Filed  Vitals:   07/22/13 0130 07/22/13 0152 07/22/13 0605 07/22/13 1325  BP: 133/62 135/60 140/62 138/63  Pulse: 78 79 85 99  Temp: 98.2 F (36.8 C) 98.5 F (36.9 C) 98.1 F (36.7 C) 98 F (36.7 C)  TempSrc: Oral Oral Oral Oral  Resp: 16 16 16 20   Height:      Weight:   61.553 kg (135 lb 11.2 oz)   SpO2:  97% 99% 99%    General: Pt is alert, follows commands appropriately, not in acute distress Cardiovascular: Regular rate and rhythm, S1/S2 +, no murmurs, no rubs, no gallops Respiratory: Clear to auscultation bilaterally, no wheezing, no crackles, no rhonchi Abdominal: Soft, non tender, non distended, bowel sounds +, no guarding Extremities: no edema, no cyanosis, pulses palpable bilaterally DP and PT Neuro: Grossly nonfocal  Discharge Instructions  Discharge Orders   Future Appointments Provider Department Dept Phone   07/23/2013 10:10 AM [facility] [place] [phone]   07/26/2013 10:10 AM [facility] [place] [facility] [phone]   07/27/2013 9:30 AM Krista Blue [facility] [phone]   07/27/2013 10:10 AM [facility] [place] [facility] [phone]   07/27/2013 10:45 AM Conni Slipper, PA-C [place] [facility] [phone]   07/27/2013 11:30 AM [facility] [place] [facility] [phone]   07/27/2013 12:00 PM Anabel Bene, RD [facility] [phone]   07/28/2013 10:10 AM [facility] [place] [facility] [phone]   07/28/2013 11:00 AM [facility] [place] [facility] [phone]   07/29/2013 10:10 AM [facility] [place] [facility] [phone]   07/29/2013 11:00 AM [facility] [place] [facility] [phone]   07/30/2013 10:10 AM  [facility] [place] [facility] [phone]   07/30/2013 10:30 AM [facility] Inj Nurse [place] [facility] [phone]   08/02/2013 10:10 AM [facility] [place] [facility] [phone]   08/03/2013 10:10 AM [facility] [place] [facility] [phone]   08/04/2013 11:50 AM [facility] [place] [facility] [phone]   08/05/2013 11:45 AM [facility] [place] [facility] [phone]   08/06/2013 11:50 AM [facility] [place] [facility] [phone]   Future Orders Complete By Expires     Call MD for:  difficulty breathing, headache or visual disturbances  As directed     Call MD for:  persistant dizziness or light-headedness  As directed     Call MD for:  persistant nausea and vomiting  As directed     Call MD for:  severe uncontrolled pain  As directed     Diet - low sodium heart healthy  As directed     Discharge instructions  As directed  Comments:      Please follow up with radiation oncology tomorrow Friday 07/23/13 for radiation treatment. Please follow up with Dr. Arbutus Ped to recheck CBC to make sure hemoglobin and platelet count is stable.    Increase activity slowly  As directed         Medication List         acetaminophen 500 MG tablet  Commonly known as:  TYLENOL  Take 500 mg by mouth every 6 (six) hours as needed for pain.     amLODipine 5 MG tablet  Commonly known as:  NORVASC  TAKE ONE TABLET BY MOUTH ONE TIME DAILY     antiseptic oral rinse Liqd  15 mLs by Mouth Rinse route as needed.     aspirin 81 MG tablet  Take 81 mg by mouth daily.     emollient cream  Commonly known as:  BIAFINE  Apply topically 2 (two) times daily.     fentaNYL 12 MCG/HR  Commonly known as:  DURAGESIC - dosed mcg/hr  Place 1 patch (12.5 mcg total) onto the skin every 3  (three) days.     fluconazole 100 MG tablet  Commonly known as:  DIFLUCAN  Take 2 tabs today, then 1 tab daily for 20 more days for thrush     guaiFENesin 600 MG 12 hr tablet  Commonly known as:  MUCINEX  Take 1,200 mg by mouth daily.     hyaluronate sodium Gel  Apply topically 2 (two) times daily.     HYDROcodone-acetaminophen 7.5-325 mg/15 ml solution  Commonly known as:  HYCET  Take 15 mLs by mouth 4 (four) times daily as needed for pain.     latanoprost 0.005 % ophthalmic solution  Commonly known as:  XALATAN  Place 1 drop into the left eye at bedtime.     magic mouthwash w/lidocaine Soln  Take 5 mLs by mouth 4 (four) times daily as needed.     multivitamin capsule  Take 1 capsule by mouth daily.     MYOFLEX EX  Apply 1 application topically 2 (two) times daily as needed. For pain  AS NEEDED     potassium chloride SA 20 MEQ tablet  Commonly known as:  KLOR-CON M20  Take 1 tablet (20 mEq total) by mouth 2 (two) times daily.     prochlorperazine 10 MG tablet  Commonly known as:  COMPAZINE  Take 1 tablet (10 mg total) by mouth every 6 (six) hours as needed.     sucralfate 1 GM/10ML suspension  Commonly known as:  CARAFATE  Take 1 g by mouth 4 (four) times daily.     Vitamin D 2000 UNITS Caps  Take 1 capsule by mouth daily.     ZYRTEC ALLERGY 10 MG Caps  Generic drug:  Cetirizine HCl  Take 1 capsule by mouth.           Follow-up Information   Follow up with NADEL,SCOTT M, MD In 1 week.   Contact information:   [address] [place] [address] [phone]        The results of significant diagnostics from this hospitalization (including imaging, microbiology, ancillary and laboratory) are listed below for reference.    Significant Diagnostic Studies: Ct Chest W Contrast  07/02/2013   *RADIOLOGY REPORT*  Clinical Data:  Small cell lung cancer.  Patient status post chemotherapy  CT CHEST, ABDOMEN AND PELVIS WITH CONTRAST  Technique:  Multidetector  CT imaging of the chest, abdomen and pelvis  was performed following the standard protocol during bolus administration of intravenous contrast.  Contrast: OMNIPAQUE IOHEXOL 300 MG/ML  SOLN  Comparison:  PET CT scan 05/24/2013, CT chest 05/03/2013  CT CHEST  Findings:  Compared to the CT chest of 05/03/2013, the large right paratracheal mass is decreased in size measuring 29 x 25 mm compared to 60 x 40 mm.  Likewise a right upper paratracheal node is decreased measuring 8 mm compared to 24 mm.  The subcarinal and right hilar nodes are also decreased in size and are not pathologic by size criteria.  Review of the lung parenchyma demonstrates a small nodule in the right middle lobe measuring 4 mm unchanged from prior.  The branching nodular disease in the right upper lobe described on prior exam has near completely resolved.  Trace nodularity remaining.  IMPRESSION:  1.  Marked reduction in volume of bulky mediastinal lymphadenopathy. 2.  Reduction in branching nodularity within the right upper lobe. 3.  No evidence of disease progression.  CT ABDOMEN AND PELVIS  Findings:  No focal hepatic lesion.  The gallbladder, pancreas, spleen, adrenal glands, and kidneys are unchanged.  Bilateral nonenhancing renal cysts.  The stomach, small bowel and, and colon are unremarkable. Abdominal aorta normal caliber.  No retroperitoneal lymphadenopathy.  There are small sub centimeter periaortic lymph nodes which not changed from prior.  The prostate gland bladder normal.  Small bladder diverticulum on the left side of bladder.  No pelvic lymphadenopathy. Review of bone windows demonstrates no aggressive osseous lesions.  IMPRESSION:   No evidence metastasis in the abdomen or pelvis.   Original Report Authenticated By: Genevive Bi, M.D.   Ct Abdomen Pelvis W Contrast  07/02/2013   *RADIOLOGY REPORT*  Clinical Data:  Small cell lung cancer.  Patient status post chemotherapy  CT CHEST, ABDOMEN AND PELVIS WITH CONTRAST   Technique:  Multidetector CT imaging of the chest, abdomen and pelvis was performed following the standard protocol during bolus administration of intravenous contrast.  Contrast: OMNIPAQUE IOHEXOL 300 MG/ML  SOLN  Comparison:  PET CT scan 05/24/2013, CT chest 05/03/2013  CT CHEST  Findings:  Compared to the CT chest of 05/03/2013, the large right paratracheal mass is decreased in size measuring 29 x 25 mm compared to 60 x 40 mm.  Likewise a right upper paratracheal node is decreased measuring 8 mm compared to 24 mm.  The subcarinal and right hilar nodes are also decreased in size and are not pathologic by size criteria.  Review of the lung parenchyma demonstrates a small nodule in the right middle lobe measuring 4 mm unchanged from prior.  The branching nodular disease in the right upper lobe described on prior exam has near completely resolved.  Trace nodularity remaining.  IMPRESSION:  1.  Marked reduction in volume of bulky mediastinal lymphadenopathy. 2.  Reduction in branching nodularity within the right upper lobe. 3.  No evidence of disease progression.  CT ABDOMEN AND PELVIS  Findings:  No focal hepatic lesion.  The gallbladder, pancreas, spleen, adrenal glands, and kidneys are unchanged.  Bilateral nonenhancing renal cysts.  The stomach, small bowel and, and colon are unremarkable. Abdominal aorta normal caliber.  No retroperitoneal lymphadenopathy.  There are small sub centimeter periaortic lymph nodes which not changed from prior.  The prostate gland bladder normal.  Small bladder diverticulum on the left side of bladder.  No pelvic lymphadenopathy. Review of bone windows demonstrates no aggressive osseous lesions.  IMPRESSION:   No evidence metastasis in  the abdomen or pelvis.   Original Report Authenticated By: Genevive Bi, M.D.    Microbiology: No results found for this or any previous visit (from the past 240 hour(s)).   Labs: Basic Metabolic Panel:  Recent Labs Lab 07/19/13 1206  07/19/13 1339 07/19/13 1437 07/21/13 0940 07/22/13 0421  NA 156* 153*  --  153 Repeated and verified X2.* 154*  K 3.1* 2.9*  --  3.2* 3.2*  CL  --  113*  --  120 Repeated and verified X2.* 115*  CO2 27 29  --  27 31  GLUCOSE 128 121*  --  156* 116*  BUN 82.4* 81*  --  46* 32*  CREATININE 1.8* 1.79*  --  1.4 0.99  CALCIUM 9.6 9.3  --  9.1 8.9  MG  --   --  2.4  --   --    Liver Function Tests:  Recent Labs Lab 07/19/13 1339 07/21/13 0940 07/22/13 0421  AST 10 15 13   ALT 12 17 14   ALKPHOS 51 48 62  BILITOT 1.2 1.1 1.7*  PROT 6.1 5.7* 6.0  ALBUMIN 3.5 3.5 3.4*   No results found for this basename: LIPASE, AMYLASE,  in the last 168 hours No results found for this basename: AMMONIA,  in the last 168 hours CBC:  Recent Labs Lab 07/19/13 1206 07/19/13 1339 07/21/13 0940 07/22/13 0421  WBC 3.8* 3.8* 4.7 5.7  NEUTROABS 3.1 3.1 4.1  --   HGB 7.7* 7.9* 7.3 Repeated and verified X2.* 9.7*  HCT 23.2* 23.0* 21.4 Repeated and verified X2.* 28.7*  MCV 89.9 90.6 92.9 90.3  PLT 8* 9* 14.0 Repeated and verified X2.* 17*   Cardiac Enzymes:  Recent Labs Lab 07/19/13 1339  TROPONINI <0.30   BNP: BNP (last 3 results) No results found for this basename: PROBNP,  in the last 8760 hours CBG:  Recent Labs Lab 07/22/13 0806  GLUCAP 107*    Time coordinating discharge: Over 30 minutes  Signed:  Manson Passey, MD  [facility]  07/22/2013, 2:54 PM  Pager #: [phone]

## 2013-07-22 NOTE — Telephone Encounter (Signed)
Called, spoke with pt's wife.  She is currently at home but will be leaving shortly to pick pt back up from the hospital.  She would like SN to know he was re-admitted through ER yesterday.  He received 2 blood transfusion.  Per wife, his levels are up but still not where they should be.  Pt is insisting on leaving again.  They will be going to pick him up shortly.  Also, she would like SN to know Dr. Neldon Mc has agreed to see pt. Will route to SN as FYI.

## 2013-07-22 NOTE — Progress Notes (Signed)
Patient discharged home, all discharge medications and instructions reviewed and questions answered. Patient to be assisted to vehicle by wheelchair.  

## 2013-07-22 NOTE — Telephone Encounter (Signed)
Called and talked to [facility] nurse, Jennette Kettle, RN.  Let her know that Reeve will not have radiation treatment today.  She stated that he did not want treatment today and that he is doing OK.

## 2013-07-22 NOTE — Progress Notes (Signed)
Utilization review completed.  

## 2013-07-22 NOTE — Telephone Encounter (Signed)
Called and spoke with pts wife and she is aware of appt we will schedule his HFU  On 8/21 at 12.   She will call tomorrow and reschedule his appt with oncology and she will call me back after this so i can schedule his appt with SN.  Will not allow me to schedule since his appt is at 11:50.

## 2013-07-22 NOTE — Consult Note (Signed)
[facility]  [phone]     ONCOLOGY  HOSPITAL CONSULTATION NOTE  Cory [name]                                MR#: [id number]  DOB: June 21, 1929                       CSN#: [id number]  Referring MD: Dr. Emmie Niemann [facility]   Primary MD: Dr.Nadell  Reason for Consult: Lung Cancer  DIAGNOSIS: 79 year old male with  Limited stage small cell lung cancer diagnosed in May of 2014.   PRIOR THERAPY: None.   CURRENT THERAPY: Systemic chemotherapy with carboplatin for AUC of 5 on day 1 and etoposide 120 mg/M2 on days 1, 2 and 3 with Neulasta support on day 4 every 3 weeks. Status post 3 cycles, concurrent with radiotherapy under Dr. Joellen Jersey direction, last received on 07/19/2013. First cycle was on 05/25/13. Last cycle 3 was given on 7/22-7/24 with Neulasta on 7/25.  Was due for Day 1, Cycle 4 (Planned) on 07/27/2013 with Carboplatin D1 and Etoposide D1-3 q 21 days.  He was last seen at [facility] on 07/06/13 by Dr. Arbutus Ped.   NWG:NFAO R Grieger initially was seen at the ED after receiving radiation with severe dehydration, poor oral intake, and lightheadedness. He had been found to have oral  Thrush likely due to radiation. He had radiation esophagitis as well. Labs had shown ARF, pancytopenia (with platelets of 8), hypernatremia and hypokalemia At the time, other complaints included worsening right leg sciatica. He denied  SOB, CP, diarrhea, dysuria, fever, chills, melena, hematemesis,  or hematochezia. He was to be admitted but left AMA on 8/5 despite his serious clinical condition. He presented to his PCP on 8/6 with same complaints, for which he was referred again to the ED. By then he was severely dehydrated among the problems mentioned above. This time he accepted to be admitted. Labs on admission revealed sodium 153, chloride 120 potassium 3.2, BUN 46, creatinine 1.4 (baseline 20/0.7)Hemoglobin 7.3, platelets 14 (baseline 10.7, 220 respectively on  07/06/2013 ). Today, H/H is  9.7/28.7 and platelets slightly up from prior day at 17,000.  We have been informed of the patient's admission while all his other metabolic abnormalities are being managed by the admitting team. He reports that back pain is better controlled with the Fentanyl patch and the back brace. He is ambulating with assistance with less difficulty. He reports feeling better, wishes to be discharged.        PMH:  Past Medical History  Diagnosis Date  . OSA (obstructive sleep apnea)   . COPD (chronic obstructive pulmonary disease)   . Cigarette smoker   . Hypertension   . Venous insufficiency   . Hypercholesteremia   . Borderline diabetes mellitus   . Diverticulosis of colon   . Hx of colonic polyps   . BPH (benign prostatic hyperplasia)   . DJD (degenerative joint disease)   . Lumbar back pain   . Leg cramps   . History of skin cancer   . History of shingles   . History of chemotherapy 05/25/2013    carboplatin, etoposide, neulasta every 3 weeks     Surgeries:  Past Surgical History  Procedure Laterality Date  . Appendectomy    . Tonsillectomy and adenoidectomy    . Left inguinal hernia repair  1998    Dr.  Davis  . Lumbar laminectomy  1997    Dr. Roxan Hockey  . Lymph node biopsy  05/06/2013    Allergies:  Allergies  Allergen Reactions  . Niacin     REACTION: Intol to Niacin w/ flushing in large doses    Medications:   . amLODipine  5 mg Oral Daily  . [START ON 07/23/2013] fentaNYL  12.5 mcg Transdermal Q72H  . fluconazole  100 mg Oral Daily  . guaiFENesin  1,200 mg Oral Daily  . latanoprost  1 drop Left Eye QHS  . potassium chloride SA  20 mEq Oral BID  . sucralfate  1 g Oral QID   JYN:WGNFAOZHY, HYDROcodone-acetaminophen, magic mouthwash w/lidocaine, MUSCLE RUB, prochlorperazine  ROS: see HPI for significant positives. Rest of ROS negative  Family History  Problem Relation Age of Onset  . Cervical cancer Mother   . Breast cancer Sister   . Prostate cancer Father        Social History:  reports that he has been smoking Cigarettes.  He has a 60 pack-year smoking history. He has never used smokeless tobacco. He reports that he does not drink alcohol or use illicit drugs.  Physical Exam    Filed Vitals:   07/22/13 0605  BP: 140/62  Pulse: 85  Temp: 98.1 F (36.7 C)  Resp: 16     Filed Weights   07/22/13 0605  Weight: 135 lb 11.2 oz (61.553 kg)   General:   79 year old  in no acute distress A. and O. x3    HEENT: Normocephalic, atraumatic, alopecia  PERRLA. Sclerae anicteric. No mucositis.  NECK:supple.radiation changes noted bilaterally LUNGS: clear bilaterally . No wheezing, rhonchi or rales. No axillary masses. BREASTS: not examined. CARDIOVASCULAR: regular rate and rhythm, no murmur , rubs or gallops ABDOMEN: soft nontender , bowel sounds x4. No HSM. No masses palpable.  GU/rectal: deferred. EXTREMITIES: no clubbing cyanosis or edema. Multiple areas of bruising at venipuncture sites, no  petechial rash MUSCULOSKELETAL: no spinal tenderness. He has sciatic pain NEURO: Non Focal. No Horner's.   Labs:  CBC   Recent Labs Lab 07/19/13 1206 07/19/13 1339 07/21/13 0940 07/22/13 0421  WBC 3.8* 3.8* 4.7 5.7  HGB 7.7* 7.9* 7.3 Repeated and verified X2.* 9.7*  HCT 23.2* 23.0* 21.4 Repeated and verified X2.* 28.7*  PLT 8* 9* 14.0 Repeated and verified X2.* 17*  MCV 89.9 90.6 92.9 90.3  MCH 29.8 31.1  --  30.5  MCHC 33.2 34.3 34.1 33.8  RDW 15.6* 15.3 16.8* 15.3  LYMPHSABS 0.2* 0.2* 0.2*  --   MONOABS 0.4 0.4 0.4  --   EOSABS 0.0 0.0 0.0  --   BASOSABS 0.0 0.0 0.0  --      CMP    Recent Labs Lab 07/19/13 1206 07/19/13 1339 07/19/13 1437 07/21/13 0940 07/22/13 0421  NA 156* 153*  --  153 Repeated and verified X2.* 154*  K 3.1* 2.9*  --  3.2* 3.2*  CL  --  113*  --  120 Repeated and verified X2.* 115*  CO2 27 29  --  27 31  GLUCOSE 128 121*  --  156* 116*  BUN 82.4* 81*  --  46* 32*  CREATININE 1.8* 1.79*  --  1.4 0.99   CALCIUM 9.6 9.3  --  9.1 8.9  MG  --   --  2.4  --   --   AST  --  10  --  15 13  ALT  --  12  --  17 14  ALKPHOS  --  51  --  48 62  BILITOT  --  1.2  --  1.1 1.7*        Component Value Date/Time   BILITOT 1.7* 07/22/2013 0421   BILITOT 2.84* 07/13/2013 0918   BILIDIR 0.3 07/21/2013 0940      Imaging Studies:  07/02/2013:  CT CHEST Findings: Compared to the CT chest of 05/03/2013, the large right paratracheal mass is decreased in size measuring 29 x 25 mm compared to 60 x 40 mm. Likewise a right upper paratracheal node is decreased measuring 8 mm compared to 24 mm. The subcarinal and right hilar nodes are also decreased in size and are not pathologic by size criteria. Review of the lung parenchyma demonstrates a small nodule in the right middle lobe measuring 4 mm unchanged from prior. The branching nodular disease in the right upper lobe described on prior exam has near completely resolved. Trace nodularity remaining. IMPRESSION: 1. Marked reduction in volume of bulky mediastinal lymphadenopathy. 2. Reduction in branching nodularity within the right upper lobe. 3. No evidence of disease progression.   CT ABDOMEN AND PELVIS Findings: No focal hepatic lesion. The gallbladder, pancreas, spleen, adrenal glands, and kidneys are unchanged. Bilateral nonenhancing renal cysts. The stomach, small bowel and, and colon are unremarkable. Abdominal aorta normal caliber. No retroperitoneal lymphadenopathy. There are small sub centimeter periaortic lymph nodes which not changed from prior. The prostate gland bladder normal. Small bladder diverticulum on the left side of bladder. No pelvic lymphadenopathy. Review of bone windows demonstrates no aggressive osseous lesions. IMPRESSION: No evidence metastasis in the abdomen or pelvis. Original Report Authenticated By: Genevive Bi, M.D.     A/P: 79 y.o. male admitted with  Limited stage small cell lung cancer diagnosed in May of 2014.  In Systemic  chemotherapy with carboplatin for AUC of 5 on day 1 and etoposide 120 mg/M2 on days 1, 2 and 3 with Neulasta support on day 4 every 3 weeks. Status post 3 cycles, concurrent with radiotherapy under Dr. Joellen Jersey direction, last received on 07/19/2013. First cycle was on 05/25/13. Last cycle 3 was given on 7/22-7/24 with Neulasta on 7/25. Was due for Day 1, Cycle 4 (Planned) on 07/27/2013 with Carboplatin D1 and Etoposide D1-3 q 21 days.  2. Anemia: slowly improving, continue to monitor 3. Thrombocytopenia: monitor very closely, today slightly increased to 17k from 14k.  4. Full Code 5. All other medical issues as per IM. He wishes to continue to be on Fentanyl patch for pain control.  We were kindly informed of the patient's admission.  Dr.  Arbutus Ped  is to  write an  addendum to this note . Thank you for allowing [place] to participate in this patient's care   Marcos Eke, PA-C 07/22/2013 8:52 AM  ADDENDUM: Hematology/Oncology Attending: The patient is seen and examined today. I agree with the above note. He has a history of limited stage small cell lung cancer currently undergoing systemic chemotherapy with carboplatin and etoposide as well as concurrent radiotherapy. He was admitted to the hospital 2 days ago with pancytopenia as well as electrolyte abnormalities. The patient also angry at that time with his admission and decided to leave the hospital AMA. He was seen yesterday by his primary care physician Dr. Kriste Basque who convinced him to be readmitted for evaluation and management of his condition. Dr. Kriste Basque spoke to me yesterday and ask of me to resume the care of this patient for his small cell lung  cancer as the patient apologized for his previous behavior during the admission and would like to continue his care with me. I saw the patient today and he mentions that he does not remember what happened 2 days ago. He is feeling a [place] bit better today. He continues to complain of dry mouth as well as sore  throat and pain on the right side of the neck from radiation induced erythema and burns. His renal function improved. His platelets count to stay low but also improving And the patient is asymptomatic with no bleeding issues. We will continue to monitor his platelets counts closely. The patient has a long history of smoking and consider nicotine patch during this admission. For the dry mouth and sore throat I advised the patient to continue on Biotin and pain medication. I would see the patient back for follow up visit next week for evaluation before resuming cycle #4 of his chemotherapy. Thank you so much for taken good care of Mr. Kohan, I will continue to follow up the patient with you and assist in his management an as-needed basis.

## 2013-07-22 NOTE — Telephone Encounter (Signed)
Called Cory [name], Cory [name]'s wife.  She was wondering if he will have radiation today.  Advised her that he will not have radiation today per Dr. Mitzi Hansen because of his platelet count of 17.  Her daughter, Alexia Freestone, was also on the line and was wondering what was causing his low platelets and the burining in his throat.  Told her that the chemotherapy he received was causing the low blood counts and that the radiation was causing his sore throat.  Patty then asked when they will know that "enough is enough."  Advised her it is up to the family and the doctors.  She is wanting to to have Dr. Mitzi Hansen call them to discuss this.

## 2013-07-22 NOTE — Progress Notes (Signed)
I visited briefly with Mr. Lipsey in his hospital room.  He appeared very alert and oriented and very clear about wanting to be discharged from the hospital and continue taking radiation.  I explained that the Patient and Support Team works with patients and families to get them the support they need to support their choices.  I explained that the team of social worker, dietician and chaplain work together to support patients and families.  Mr. Landrigan was very dismissive and angry and said no one is listening to what he wants, including his family.  He was insistent that he is ready to be discharged today to home.  While I was speaking with him, Mr. Shadowens got up to go to the bathroom to brush his teeth. I asked a nurse assistant to come into the room to be with him while he was standing.  I spoke with the nurse in the hallway and expressed my opinion  that the case manager should be involved in assessing the need for help in the home, should Mr. Honeycutt be discharged today.

## 2013-07-22 NOTE — Progress Notes (Signed)
I talked this morning with Mrs. Ho and her daughter, Alexia Freestone.  She reported that her husband was admitted to the hospital on August 6.  Mrs. Stefanko is concerned about how she will manage her husband when he is discharged, because she feels he is unstable on his feet.  She expressed a need for help in understanding the process for knowing what help is available and making arrangements for it.  I spoke with our social worker, Kathrin Penner; Lauren will be in touch with Mrs. Latorre by phone and also speak with the in-patient social worker.  Mrs. Ledford did say that her daughter, who lives out of town, will be returning home soon.

## 2013-07-22 NOTE — Telephone Encounter (Signed)
SN is aware. Nothing further is needed.  

## 2013-07-23 ENCOUNTER — Telehealth: Payer: Self-pay | Admitting: *Deleted

## 2013-07-23 ENCOUNTER — Other Ambulatory Visit: Payer: Self-pay | Admitting: Oncology

## 2013-07-23 ENCOUNTER — Ambulatory Visit: Payer: Medicare Other

## 2013-07-23 ENCOUNTER — Telehealth: Payer: Self-pay | Admitting: Oncology

## 2013-07-23 NOTE — Telephone Encounter (Signed)
Spoke with patients spouse Chales Abrahams-- Patient has been scheduled for 8/21 @ 12 Spouse states the cancer center cancelled patients radiation d/t decreased platelets Patient is to have labs drawn on Monday to recheck level Per spouse Dr. Shirline Frees has rec patient drink 64oz of water daily over the weekend to keep from dehydration She says patient prob will not be able to do this Would like recs from Dr. Kriste Basque on what she should do in the event patient becomes dehydration from not drinking enough Dr. Kriste Basque please advise, thank you!  Current Outpatient Prescriptions on File Prior to Visit  Medication Sig Dispense Refill  . acetaminophen (TYLENOL) 500 MG tablet Take 500 mg by mouth every 6 (six) hours as needed for pain.      Marland Kitchen Alum & Mag Hydroxide-Simeth (MAGIC MOUTHWASH W/LIDOCAINE) SOLN Take 5 mLs by mouth 4 (four) times daily as needed.  250 mL  1  . amLODipine (NORVASC) 5 MG tablet TAKE ONE TABLET BY MOUTH ONE TIME DAILY  90 tablet  0  . antiseptic oral rinse (BIOTENE) LIQD 15 mLs by Mouth Rinse route as needed.      Marland Kitchen aspirin 81 MG tablet Take 81 mg by mouth daily.        . Cetirizine HCl (ZYRTEC ALLERGY) 10 MG CAPS Take 1 capsule by mouth.      . Cholecalciferol (VITAMIN D) 2000 UNITS CAPS Take 1 capsule by mouth daily.       Marland Kitchen emollient (BIAFINE) cream Apply topically 2 (two) times daily.      . fentaNYL (DURAGESIC - DOSED MCG/HR) 12 MCG/HR Place 1 patch (12.5 mcg total) onto the skin every 3 (three) days.  5 patch  0  . fluconazole (DIFLUCAN) 100 MG tablet Take 2 tabs today, then 1 tab daily for 20 more days for thrush  22 tablet  0  . guaiFENesin (MUCINEX) 600 MG 12 hr tablet Take 1,200 mg by mouth daily.      . hyaluronate sodium (RADIAPLEXRX) GEL Apply topically 2 (two) times daily.      Marland Kitchen HYDROcodone-acetaminophen (HYCET) 7.5-325 mg/15 ml solution Take 15 mLs by mouth 4 (four) times daily as needed for pain.  473 mL  0  . latanoprost (XALATAN) 0.005 % ophthalmic solution Place 1 drop  into the left eye at bedtime.        . Multiple Vitamin (MULTIVITAMIN) capsule Take 1 capsule by mouth daily.        . potassium chloride SA (KLOR-CON M20) 20 MEQ tablet Take 1 tablet (20 mEq total) by mouth 2 (two) times daily.  180 tablet  3  . prochlorperazine (COMPAZINE) 10 MG tablet Take 1 tablet (10 mg total) by mouth every 6 (six) hours as needed.  60 tablet  0  . sucralfate (CARAFATE) 1 GM/10ML suspension Take 1 g by mouth 4 (four) times daily.      Terrance Mass Salicylate (MYOFLEX EX) Apply 1 application topically 2 (two) times daily as needed. For pain  AS NEEDED       No current facility-administered medications on file prior to visit.    Allergies  Allergen Reactions  . Niacin     REACTION: Intol to Niacin w/ flushing in large doses

## 2013-07-23 NOTE — Telephone Encounter (Signed)
Called Cory [name] and let him know to come in at 9:30 on Monday, 07/26/2013 to have his labs drawn.  He was OK with this time.  Also, advised him not to apply more than 1 fentanyl patch at a time.  Told him to change the patch every 72 hours.  He stated that he understood and would only apply 1 patch every 72 hours.

## 2013-07-23 NOTE — Telephone Encounter (Signed)
Pt's daughter Alexia Freestone called stating that she feels pt needs lab work done today and that he is needing fluids.  Per Dr Donnald Garre, lab work reviewed from 07/22/13.  Pt needs to continue to drink PO fluids (at least 64oz) and labs will be rechecked next week.  Patty verbalized understanding.  SLJ

## 2013-07-23 NOTE — Telephone Encounter (Signed)
Called Mr. Heatherly and his daughter Alexia Freestone and let him know that he will not have radiation today per Dr. Kathrynn Running due to his platelet count of 17.  Advised him that he will need to have labs on Monday before treatment.  Told him I would call him back with the time that he needs to be here on Monday.  Mr. Linse was inquiring about getting labs drawn today.  Told him that checking the counts on Monday before treatment would be more accurate.   Alexia Freestone was wondering how to have labs drawn today to assess his hydration.  Advised her that Dr. Mitzi Hansen is out today and she said that she would like to speak to Dr. Rebecca Eaton nurse as well.  Transferred her to Dr. Rebecca Eaton nurse.

## 2013-07-23 NOTE — Telephone Encounter (Signed)
Daughter called per patient request asking if he could put an additional 1-2 fentanyl  patches on for his pain, doesn't think it is helping, its the lowest dose , daughter told him no, he insisted she call and ask Dr,.Basilio Cairo, she prescribed  Fentanyl rx',about increasing, will notify Dr.[name]'s nurse, karen hess, and will have her discuss with MD and call daughter  Back 3:22 PM

## 2013-07-23 NOTE — Telephone Encounter (Signed)
Spoke with the pt's spouse and notified of recs per SN She verbalized understanding and states nothing further needed

## 2013-07-23 NOTE — Telephone Encounter (Signed)
Per SN---  1.  Do the best he can with oral hydration 2.  Follow up with Dr. Shirline Frees  Next week 3.  Keep appt with SN on 8/21.   If he gets worse, his only option is the ER.  thanks

## 2013-07-23 NOTE — Telephone Encounter (Signed)
Wife returning call.  Cell#[phone]

## 2013-07-26 ENCOUNTER — Ambulatory Visit
Admission: RE | Admit: 2013-07-26 | Discharge: 2013-07-26 | Disposition: A | Discharge status: Home / Self Care | Payer: Medicare Other | Source: Ambulatory Visit | Attending: Radiation Oncology | Admitting: Radiation Oncology

## 2013-07-26 LAB — CBC WITH DIFFERENTIAL/PLATELET
Eosinophils Absolute: 0 10*3/uL (ref 0.0–0.5)
LYMPH%: 6 % — ABNORMAL LOW (ref 14.0–49.0)
MONO#: 0.6 10*3/uL (ref 0.1–0.9)
NEUT#: 4.6 10*3/uL (ref 1.5–6.5)
Platelets: 91 10*3/uL — ABNORMAL LOW (ref 140–400)
RBC: 2.84 10*6/uL — ABNORMAL LOW (ref 4.20–5.82)
WBC: 5.6 10*3/uL (ref 4.0–10.3)

## 2013-07-26 NOTE — Progress Notes (Signed)
OK to treat per Dr. Mitzi Hansen.  Platelets are 91 today.  Notified Linac 4.

## 2013-07-27 ENCOUNTER — Ambulatory Visit
Admission: RE | Admit: 2013-07-27 | Discharge: 2013-07-27 | Disposition: A | Discharge status: Home / Self Care | Payer: Medicare Other | Source: Ambulatory Visit | Attending: Radiation Oncology | Admitting: Radiation Oncology

## 2013-07-27 ENCOUNTER — Encounter: Payer: Medicare Other | Admitting: Nutrition

## 2013-07-27 ENCOUNTER — Ambulatory Visit: Payer: Medicare Other

## 2013-07-27 ENCOUNTER — Other Ambulatory Visit: Payer: Self-pay | Admitting: Oncology

## 2013-07-27 ENCOUNTER — Other Ambulatory Visit (HOSPITAL_BASED_OUTPATIENT_CLINIC_OR_DEPARTMENT_OTHER): Payer: Medicare Other | Admitting: Lab

## 2013-07-27 ENCOUNTER — Ambulatory Visit (HOSPITAL_BASED_OUTPATIENT_CLINIC_OR_DEPARTMENT_OTHER): Payer: Medicare Other | Admitting: Physician Assistant

## 2013-07-27 VITALS — BP 124/67 | HR 72 | Temp 97.4°F | Resp 18 | Ht 69.0 in | Wt 139.8 lb

## 2013-07-27 DIAGNOSIS — C3490 Malignant neoplasm of unspecified part of unspecified bronchus or lung: Secondary | ICD-10-CM

## 2013-07-27 LAB — CBC WITH DIFFERENTIAL/PLATELET
Basophils Absolute: 0 10*3/uL (ref 0.0–0.1)
Eosinophils Absolute: 0 10*3/uL (ref 0.0–0.5)
HCT: 25.1 % — ABNORMAL LOW (ref 38.4–49.9)
HGB: 8.3 g/dL — ABNORMAL LOW (ref 13.0–17.1)
LYMPH%: 8.7 % — ABNORMAL LOW (ref 14.0–49.0)
MCHC: 33.1 g/dL (ref 32.0–36.0)
MONO#: 0.7 10*3/uL (ref 0.1–0.9)
NEUT#: 3.3 10*3/uL (ref 1.5–6.5)
NEUT%: 75.2 % — ABNORMAL HIGH (ref 39.0–75.0)
Platelets: 116 10*3/uL — ABNORMAL LOW (ref 140–400)
WBC: 4.4 10*3/uL (ref 4.0–10.3)

## 2013-07-28 ENCOUNTER — Ambulatory Visit
Admission: RE | Admit: 2013-07-28 | Discharge: 2013-07-28 | Disposition: A | Discharge status: Home / Self Care | Payer: Medicare Other | Source: Ambulatory Visit | Attending: Radiation Oncology | Admitting: Radiation Oncology

## 2013-07-28 ENCOUNTER — Ambulatory Visit: Payer: Medicare Other

## 2013-07-28 DIAGNOSIS — C3490 Malignant neoplasm of unspecified part of unspecified bronchus or lung: Secondary | ICD-10-CM

## 2013-07-28 MED ORDER — BIAFINE EX EMUL
Freq: Two times a day (BID) | CUTANEOUS | Status: DC
  Administered 2013-07-28: 14:00:00 via TOPICAL

## 2013-07-28 MED ORDER — FENTANYL 12 MCG/HR TD PT72: 1.0000 | MEDICATED_PATCH | TRANSDERMAL | Status: DC

## 2013-07-28 NOTE — Progress Notes (Signed)
Cory [name] here for weekly under treat visit.  He has had 27 fractions to his lung.  He is having pain on his neck that he is rating at a 5/10.  He states that he is using biafine and it has helped his skin.  He reports that his sciatica is much improved with the fentanyl patch.  The skin on his neck is peeling and has a greenish crust on it.  He states this throat is feeling better with swallowing.  He is able to swallow liquids and some soft foods.  He has gained 3 lbs since 8/7.  He does have occasional dizziness.  He is wanting clarification on some of his meds.  He is wondering if he should be taking hycet while wearing a fentanyl patch.  Also he wants to know if he should be taking fluconazole.  His mouth does not have any white patches today.  His chemotherapy is on hold this week.

## 2013-07-28 NOTE — Addendum Note (Signed)
Encounter addended by: Kevyn Boquet R Dhilan Brauer, RN on: 07/28/2013  2:14 PM<BR>     Documentation filed: Inpatient MAR

## 2013-07-28 NOTE — Addendum Note (Signed)
Encounter addended by: Eduardo Osier, RN on: 07/28/2013  2:12 PM<BR>     Documentation filed: Orders

## 2013-07-28 NOTE — Progress Notes (Signed)
Department of Radiation Oncology  Phone:  [phone] Fax:        [phone]  Weekly Treatment Note    Name: Cory [name] Date: 07/28/2013 MRN: [id number] DOB: [birth year]   Current dose: 48.6 Gy  Current fraction: 27   MEDICATIONS: Current Outpatient Prescriptions  Medication Sig Dispense Refill  . acetaminophen (TYLENOL) 500 MG tablet Take 500 mg by mouth every 6 (six) hours as needed for pain.      Marland Kitchen Alum & Mag Hydroxide-Simeth (MAGIC MOUTHWASH W/LIDOCAINE) SOLN Take 5 mLs by mouth 4 (four) times daily as needed.  250 mL  1  . amLODipine (NORVASC) 5 MG tablet TAKE ONE TABLET BY MOUTH ONE TIME DAILY  90 tablet  0  . antiseptic oral rinse (BIOTENE) LIQD 15 mLs by Mouth Rinse route as needed.      Marland Kitchen aspirin 81 MG tablet Take 81 mg by mouth daily.        . Cetirizine HCl (ZYRTEC ALLERGY) 10 MG CAPS Take 1 capsule by mouth.      . Cholecalciferol (VITAMIN D) 2000 UNITS CAPS Take 1 capsule by mouth daily.       Marland Kitchen emollient (BIAFINE) cream Apply topically 2 (two) times daily.      . fentaNYL (DURAGESIC - DOSED MCG/HR) 12 MCG/HR Place 1 patch (12.5 mcg total) onto the skin every 3 (three) days.  10 patch  0  . guaiFENesin (MUCINEX) 600 MG 12 hr tablet Take 1,200 mg by mouth daily.      Marland Kitchen latanoprost (XALATAN) 0.005 % ophthalmic solution Place 1 drop into the left eye at bedtime.        . potassium chloride SA (KLOR-CON M20) 20 MEQ tablet Take 1 tablet (20 mEq total) by mouth 2 (two) times daily.  180 tablet  3  . prochlorperazine (COMPAZINE) 10 MG tablet Take 1 tablet (10 mg total) by mouth every 6 (six) hours as needed.  60 tablet  0  . simvastatin (ZOCOR) 40 MG tablet       . Trolamine Salicylate (MYOFLEX EX) Apply 1 application topically 2 (two) times daily as needed. For pain  AS NEEDED      . fluconazole (DIFLUCAN) 100 MG tablet Take 2 tabs today, then 1 tab daily for 20 more days for thrush  22 tablet  0  . hyaluronate sodium (RADIAPLEXRX) GEL Apply topically 2 (two) times  daily.      Marland Kitchen HYDROcodone-acetaminophen (HYCET) 7.5-325 mg/15 ml solution Take 15 mLs by mouth 4 (four) times daily as needed for pain.  473 mL  0  . Multiple Vitamin (MULTIVITAMIN) capsule Take 1 capsule by mouth daily.        . sucralfate (CARAFATE) 1 GM/10ML suspension Take 1 g by mouth 4 (four) times daily.       No current facility-administered medications for this encounter.     ALLERGIES: Niacin   LABORATORY DATA:  Lab Results  Component Value Date   WBC 4.4 07/27/2013   HGB 8.3* 07/27/2013   HCT 25.1* 07/27/2013   MCV 90.6 07/27/2013   PLT 116* 07/27/2013   Lab Results  Component Value Date   NA 154* 07/22/2013   K 3.2* 07/22/2013   CL 115* 07/22/2013   CO2 31 07/22/2013   Lab Results  Component Value Date   ALT 14 07/22/2013   AST 13 07/22/2013   ALKPHOS 62 07/22/2013   BILITOT 1.7* 07/22/2013     NARRATIVE: Cory [name] was seen today for  weekly treatment management. The chart was checked and the patient's films were reviewed. The patient is doing better today. He has restarted radiation treatment and his blood counts have improved some. The patient really had a very difficult time with esophagitis. He is now on a fentanyl patch which seems to be working well. The patient had a number of questions about medications which we went over and nursing really spent some time going over his medication list today as well. The patient I believe feels better about his medications and was active in taking every day.  PHYSICAL EXAMINATION: height is 5\' 9"  (1.753 m) and weight is 138 lb 11.2 oz (62.914 kg). His temperature is 97.7 F (36.5 C). His blood pressure is 109/92 and his pulse is 80. His oxygen saturation is 96%.      bright erythema present in the supraclavicular region greater on the right. He is using daily skin cream.  ASSESSMENT: The patient is doing satisfactorily with treatment.  PLAN: We will continue with the patient's radiation treatment as planned. The patient was given a  refill for fentanyl patch.

## 2013-07-29 ENCOUNTER — Ambulatory Visit
Admission: RE | Admit: 2013-07-29 | Discharge: 2013-07-29 | Disposition: A | Discharge status: Home / Self Care | Payer: Medicare Other | Source: Ambulatory Visit | Attending: Radiation Oncology | Admitting: Radiation Oncology

## 2013-07-29 ENCOUNTER — Ambulatory Visit: Payer: Medicare Other

## 2013-07-29 ENCOUNTER — Ambulatory Visit: Payer: Medicare Other | Admitting: Nutrition

## 2013-07-29 NOTE — Patient Instructions (Addendum)
As you have recently been discharged from the hospital we are rescheduling your chemotherapy to next week to allow you more time to recover.  Continue using your fentanyl patches as prescribed for pain.  Followup with Dr. Mitzi Hansen regarding the spacing of the remaining radiation therapy fractions.

## 2013-07-29 NOTE — Progress Notes (Addendum)
[facility] [phone]   [phone]  OFFICE PROGRESS NOTE  Cory Mcalpine, MD [address] [place] [address]  DIAGNOSIS: Limited stage small cell lung cancer diagnosed in May of 2014.   PRIOR THERAPY: None.   CURRENT THERAPY: Systemic chemotherapy with carboplatin for AUC of 5 on day 1 and etoposide 120 mg/M2 on days 1, 2 and 3 with Neulasta support on day 4 every 3 weeks. Status post 3 cycles, concurrent with radiotherapy. First cycle was on 05/25/13.   DISEASE STAGE: Limited stage  CHEMOTHERAPY INTENT: Curative  CURRENT # OF CHEMOTHERAPY CYCLES: 3  CURRENT ANTIEMETICS: Zofran, dexamethasone, Compazine  CURRENT SMOKING STATUS: Current smoker  ORAL CHEMOTHERAPY AND CONSENT: n/a  CURRENT BISPHOSPHONATES USE: none  PAIN MANAGEMENT: hydrocodone  NARCOTICS INDUCED CONSTIPATION: yes, better with MiraLax  LIVING WILL AND CODE STATUS:    INTERVAL HISTORY: Cory [name] 79 y.o. male returns to the clinic today for followup visit accompanied by his friend, Cory [name]. The patient is complaining of increasing fatigue and weakness. He complains of pain on his anterior chest and neck area from the radiation therapy Burns. He also is been having some increased lower back pain and "sciatic nerve irritation" affecting his right leg. When he was in the hospital he was started on Fentanyl patches and finds this helpful for both sources of pain. He was discharged from the hospital on 07/22/2013 where he was admitted for acute renal failure, however by Cory Hua discharged was deemed medically stable for discharge home. He is confused about some of his medications and I've gone over all of his medications and explained what each of them are for. Overall he tolerated tolerated the third cycle of his systemic chemotherapy fairly well with no significant adverse effects. He continues to have some mild sore throat and cough started after radiation treatment. He  denied having any significant chest pain, shortness breath or hemoptysis. The patient denied having any significant weight loss or night sweats.   MEDICAL HISTORY: Past Medical History  Diagnosis Date  . OSA (obstructive sleep apnea)   . COPD (chronic obstructive pulmonary disease)   . Cigarette smoker   . Hypertension   . Venous insufficiency   . Hypercholesteremia   . Borderline diabetes mellitus   . Diverticulosis of colon   . Hx of colonic polyps   . BPH (benign prostatic hyperplasia)   . DJD (degenerative joint disease)   . Lumbar back pain   . Leg cramps   . History of skin cancer   . History of shingles   . History of chemotherapy 05/25/2013    carboplatin, etoposide, neulasta every 3 weeks     ALLERGIES:  is allergic to niacin.  MEDICATIONS:  Current Outpatient Prescriptions  Medication Sig Dispense Refill  . acetaminophen (TYLENOL) 500 MG tablet Take 500 mg by mouth every 6 (six) hours as needed for pain.      Marland Kitchen Alum & Mag Hydroxide-Simeth (MAGIC MOUTHWASH W/LIDOCAINE) SOLN Take 5 mLs by mouth 4 (four) times daily as needed.  250 mL  1  . amLODipine (NORVASC) 5 MG tablet TAKE ONE TABLET BY MOUTH ONE TIME DAILY  90 tablet  0  . antiseptic oral rinse (BIOTENE) LIQD 15 mLs by Mouth Rinse route as needed.      Marland Kitchen aspirin 81 MG tablet Take 81 mg by mouth daily.        . Cetirizine HCl (ZYRTEC ALLERGY) 10 MG CAPS Take 1  capsule by mouth.      . Cholecalciferol (VITAMIN D) 2000 UNITS CAPS Take 1 capsule by mouth daily.       Marland Kitchen emollient (BIAFINE) cream Apply topically 2 (two) times daily.      . fentaNYL (DURAGESIC - DOSED MCG/HR) 12 MCG/HR Place 1 patch (12.5 mcg total) onto the skin every 3 (three) days.  10 patch  0  . fluconazole (DIFLUCAN) 100 MG tablet Take 2 tabs today, then 1 tab daily for 20 more days for thrush  22 tablet  0  . guaiFENesin (MUCINEX) 600 MG 12 hr tablet Take 1,200 mg by mouth daily.      . hyaluronate sodium (RADIAPLEXRX) GEL Apply topically 2 (two)  times daily.      Marland Kitchen HYDROcodone-acetaminophen (HYCET) 7.5-325 mg/15 ml solution Take 15 mLs by mouth 4 (four) times daily as needed for pain.  473 mL  0  . latanoprost (XALATAN) 0.005 % ophthalmic solution Place 1 drop into the left eye at bedtime.        . Multiple Vitamin (MULTIVITAMIN) capsule Take 1 capsule by mouth daily.        . potassium chloride SA (KLOR-CON M20) 20 MEQ tablet Take 1 tablet (20 mEq total) by mouth 2 (two) times daily.  180 tablet  3  . prochlorperazine (COMPAZINE) 10 MG tablet Take 1 tablet (10 mg total) by mouth every 6 (six) hours as needed.  60 tablet  0  . simvastatin (ZOCOR) 40 MG tablet       . sucralfate (CARAFATE) 1 GM/10ML suspension Take 1 g by mouth 4 (four) times daily.      Cory [name] Salicylate (MYOFLEX EX) Apply 1 application topically 2 (two) times daily as needed. For pain  AS NEEDED       No current facility-administered medications for this visit.   Facility-Administered Medications Ordered in Other Visits  Medication Dose Route Frequency Provider Last Rate Last Dose  . topical emolient (BIAFINE) emulsion   Topical BID Cory Coup, MD        SURGICAL HISTORY:  Past Surgical History  Procedure Laterality Date  . Appendectomy    . Tonsillectomy and adenoidectomy    . Left inguinal hernia repair  1998    Dr. Earlene [name]  . Lumbar laminectomy  1997    Dr. Roxan [name]  . Lymph node biopsy  05/06/2013    REVIEW OF SYSTEMS:  A comprehensive review of systems was negative except for: Constitutional: positive for fatigue Ears, nose, mouth, throat, and face: positive for sore throat Respiratory: positive for cough radiation burns to skin on chest and neck region   PHYSICAL EXAMINATION: General appearance: alert, cooperative, fatigued and no distress Head: Normocephalic, without obvious abnormality, atraumatic Neck: no adenopathy Lymph nodes: Cervical, supraclavicular, and axillary nodes normal. Resp: clear to auscultation bilaterally Cardio:  regular rate and rhythm, S1, S2 normal, no murmur, click, rub or gallop GI: soft, non-tender; bowel sounds normal; no masses,  no organomegaly Extremities: extremities normal, atraumatic, no cyanosis or edema Neurologic: Alert and oriented X 3, normal strength and tone. Normal symmetric reflexes. Normal coordination and gait Skin: Skin about the right anterior chest appear the clavicular line extending to the lower neck area is very erythematous with a background of scattered yellow hued open areas. however there is no evidence of any purulent drainage or any serous sanguinous drainage skin is erythematous and tender.  ECOG PERFORMANCE STATUS: 1 - Symptomatic but completely ambulatory  Blood pressure 124/67, pulse 72,  temperature 97.4 F (36.3 C), temperature source Oral, resp. rate 18, height 5\' 9"  (1.753 m), weight 139 lb 12.8 oz (63.413 kg).  LABORATORY DATA: Lab Results  Component Value Date   WBC 4.4 07/27/2013   HGB 8.3* 07/27/2013   HCT 25.1* 07/27/2013   MCV 90.6 07/27/2013   PLT 116* 07/27/2013      Chemistry      Component Value Date/Time   NA 154* 07/22/2013 0421   NA 156* 07/19/2013 1206   K 3.2* 07/22/2013 0421   K 3.1* 07/19/2013 1206   CL 115* 07/22/2013 0421   CL 104 06/08/2013 1426   CO2 31 07/22/2013 0421   CO2 27 07/19/2013 1206   BUN 32* 07/22/2013 0421   BUN 82.4* 07/19/2013 1206   CREATININE 0.99 07/22/2013 0421   CREATININE 1.8* 07/19/2013 1206      Component Value Date/Time   CALCIUM 8.9 07/22/2013 0421   CALCIUM 9.6 07/19/2013 1206   ALKPHOS 62 07/22/2013 0421   ALKPHOS 74 07/13/2013 0918   AST 13 07/22/2013 0421   AST 16 07/13/2013 0918   ALT 14 07/22/2013 0421   ALT 17 07/13/2013 0918   BILITOT 1.7* 07/22/2013 0421   BILITOT 2.84* 07/13/2013 0918       RADIOGRAPHIC STUDIES: Ct Chest W Contrast  07/02/2013   *RADIOLOGY REPORT*  Clinical Data:  Small cell lung cancer.  Patient status post chemotherapy  CT CHEST, ABDOMEN AND PELVIS WITH CONTRAST  Technique:  Multidetector CT imaging  of the chest, abdomen and pelvis was performed following the standard protocol during bolus administration of intravenous contrast.  Contrast: OMNIPAQUE IOHEXOL 300 MG/ML  SOLN  Comparison:  PET CT scan 05/24/2013, CT chest 05/03/2013    CT CHEST  Findings:  Compared to the CT chest of 05/03/2013, the large right paratracheal [name] is decreased in size measuring 29 x 25 mm compared to 60 x 40 mm.  Likewise a right upper paratracheal node is decreased measuring 8 mm compared to 24 mm.  The subcarinal and right hilar nodes are also decreased in size and are not pathologic by size criteria.  Review of the lung parenchyma demonstrates a small nodule in the right middle lobe measuring 4 mm unchanged from prior.  The branching nodular disease in the right upper lobe described on prior exam has near completely resolved.  Trace nodularity remaining.  IMPRESSION:  1.  Marked reduction in volume of bulky mediastinal lymphadenopathy. 2.  Reduction in branching nodularity within the right upper lobe. 3.  No evidence of disease progression.    CT ABDOMEN AND PELVIS  Findings:  No focal hepatic lesion.  The gallbladder, pancreas, spleen, adrenal glands, and kidneys are unchanged.  Bilateral nonenhancing renal cysts.  The stomach, small bowel and, and colon are unremarkable. Abdominal aorta normal caliber.  No retroperitoneal lymphadenopathy.  There are small sub centimeter periaortic lymph nodes which not changed from prior.  The prostate gland bladder normal.  Small bladder diverticulum on the left side of bladder.  No pelvic lymphadenopathy. Review of bone windows demonstrates no aggressive osseous lesions.  IMPRESSION:   No evidence metastasis in the abdomen or pelvis.   Original Report Authenticated By: Genevive Bi, M.D.   ASSESSMENT AND PLAN: This is a very pleasant 79 years old white male with limited stage small cell lung cancer currently undergoing systemic chemotherapy with carboplatin and etoposide  concurrent with radiation status post 3 cycles of chemotherapy. The patient has significant improvement in his disease. The patient  was discussed with and seen by Dr. Arbutus Ped. In view of his recent discharge from the hospital we will postpone the start of cycle #4 of his systemic chemotherapy with carboplatin and etoposide with Neulasta support until next week. He will continue with weekly labs as scheduled and return in 4 weeks with a repeat CBC differential, C. met and CT of the chest with contrast to reevaluate his disease. He was encouraged to followup with Dr. Mitzi Hansen is scheduled regarding his remaining radiation therapy fractions as well as the radiation burns to his skin.   Laural Benes, Lenzi Marmo E, PA-C   The patient was advised to call immediately if he has any concerning symptoms in the interval. All questions were answered. The patient knows to call the clinic with any problems, questions or concerns. We can certainly see the patient much sooner if necessary.  ADDENDUM: Hematology/Oncology Attending: I have a face to face encounter with the patient today. I recommended his care plan. The patient recently diagnosed with limited stage small cell lung cancer and he is currently undergoing systemic chemotherapy with carboplatin and etoposide concurrent with radiation. He was admitted recently to [facility] twice with severe pain in the neck area secondary to radiation induced skin burns on the right side of the neck as well as sore throat and difficulty swallowing in addition to pancytopenia and hypernatremia. He is recovering slowly from his recent hospitalization. He was supposed to start cycle #4 today but I recommended for the patient to delay his treatment until next week to have more time to recover and have more energy before starting cycle #4. He was also advised to go to Dr. Mitzi Hansen regarding his radiation treatment and the adverse effects. It may be wise to hold his radiation for a few  more days to give him time to recover. The patient agreed to the current plan. He would come back for follow up visit in 4 weeks with repeat CT scan of the chest for restaging of his disease.  Lajuana Matte., MD 07/27/2013

## 2013-07-29 NOTE — Progress Notes (Signed)
Patient reports he continues to have no appetite and food tastes terrible. He was diagnosed with thrush.  He has had weight loss with weight documented as 138.7 pounds August 13, which is decreased from 145.3 pounds July 30.  Patient tells me his chemotherapy is on hold.  He plans on completing radiation therapy and then resuming chemotherapy.  Patient expresses frustration with his long list of medications, 18 total.  He states he does not know how to take these medications.  He has met with someone who explained in detail what these medications were for and who wrote next to the medication what the medication was for.  However, patient continues to have confusion over what medication should be taken and when.  Patient reports he no longer tolerates milk-based oral nutrition supplements.  He is eating small bites of food throughout the day.  Nutrition diagnosis: Food and nutrition related knowledge deficit continues.  Intervention: I educated patient on the importance of increasing oral intake.  I provided him with samples of juice-based oral nutrition supplements such as ensure clear and boost breeze.  I've also recommended patient try unjury chicken soup for additional protein.  I've given him instructions on how to utilize these products.  I've also told him where he can purchase these products.  I have referred him back to his physician or nursing for clarification on medications.  I've encouraged him to take medications as prescribed.  Patient verbalizes understanding.  Teach back method used.  Monitoring, evaluation, goals: Patient will work to increase hydration, and oral intake to minimize further weight loss.  Next visit: Patient has my contact information and reports she will call me with further questions or concerns.

## 2013-07-30 ENCOUNTER — Telehealth: Payer: Self-pay | Admitting: *Deleted

## 2013-07-30 ENCOUNTER — Ambulatory Visit
Admission: RE | Admit: 2013-07-30 | Discharge: 2013-07-30 | Disposition: A | Discharge status: Home / Self Care | Payer: Medicare Other | Source: Ambulatory Visit | Attending: Radiation Oncology | Admitting: Radiation Oncology

## 2013-07-30 ENCOUNTER — Ambulatory Visit: Payer: Medicare Other

## 2013-07-30 ENCOUNTER — Telehealth: Payer: Self-pay | Admitting: Internal Medicine

## 2013-07-30 NOTE — Telephone Encounter (Signed)
Per staff message and POF I have scheduled appts.  JMW  

## 2013-07-30 NOTE — Telephone Encounter (Signed)
s.w. pt wife and advised on all chemo appts....she was already aware per [facility]

## 2013-08-02 ENCOUNTER — Ambulatory Visit: Payer: Medicare Other

## 2013-08-02 ENCOUNTER — Ambulatory Visit
Admission: RE | Admit: 2013-08-02 | Discharge: 2013-08-02 | Disposition: A | Discharge status: Home / Self Care | Payer: Medicare Other | Source: Ambulatory Visit | Attending: Radiation Oncology | Admitting: Radiation Oncology

## 2013-08-02 VITALS — BP 107/65 | HR 97 | Temp 97.7°F | Ht 69.0 in | Wt 133.8 lb

## 2013-08-02 DIAGNOSIS — C3491 Malignant neoplasm of unspecified part of right bronchus or lung: Secondary | ICD-10-CM

## 2013-08-02 NOTE — Progress Notes (Signed)
CC: Dr. Dorothy Puffer   Weekly Management Note:  Site: Lung/neck Current Dose:  5400  cGy Projected Dose: 6300  cGy  Narrative: The patient is seen today for routine under treatment assessment. CBCT/MVCT images/port films were reviewed. The chart was reviewed.   The patient is seen today after obtaining a history that he slipped and fell on his buttocks in the bathtub earlier today. He claims to have a history of sciatica and has been evaluated by either an orthopedist or neurosurgeon (question Dr. Lovell Sheehan) with MRI scans done outside the [facility] system. His back pain did not worsen after his fall. His fentanyl patch is controlling his esophagitis and right-sided "sciatica". He was able to stay without difficulty for his weight.  Physical Examination:  Filed Vitals:   08/02/13 1122  BP: 107/65  Pulse: 97  Temp:   .  Weight: 133 lb 12.8 oz (60.691 kg). He looks well. There is no obvious trauma from his fall. There is erythema and dry desquamation along his neck, bilaterally. No areas of moist desquamation.  Impression: Tolerating radiation therapy well. His vital signs probably reflects some degree of dehydration and I suggested the patient that he increase his fluid and salt intake.  Plan: Continue radiation therapy as planned. He'll be seen by Dr. Mitzi Hansen later this week.

## 2013-08-02 NOTE — Progress Notes (Addendum)
Cory [name] here with his wife and neighbor.  He has had 30 fractions to his lung.  He is rating his pain in his right leg at a 3/10.  He said it was an 8 this morning.  He reports that he fell in the shower this morning and his wife had to come and help him up.   He denies any bruises.  He says that his pain is worse when he is standing up.  He is OK laying down.  He denies a sore throat and is drinking about 20 ounces of liquid per day.   He has lost 5 lbs since 8/13.  He says that he is dizzy occasionally.  Orthostatic vitals were taken: sitting bp 110/62 and HR 84, standing bp 107/65 and HR 97.  The skin on his neck is red and peeling.  He is using baofine cream at least three times a day    He is currently using a fentanyl patch for pain.

## 2013-08-03 ENCOUNTER — Ambulatory Visit: Payer: Medicare Other

## 2013-08-03 ENCOUNTER — Other Ambulatory Visit (HOSPITAL_BASED_OUTPATIENT_CLINIC_OR_DEPARTMENT_OTHER): Payer: Medicare Other | Admitting: Lab

## 2013-08-03 ENCOUNTER — Ambulatory Visit
Admission: RE | Admit: 2013-08-03 | Discharge: 2013-08-03 | Disposition: A | Discharge status: Home / Self Care | Payer: Medicare Other | Source: Ambulatory Visit | Attending: Radiation Oncology | Admitting: Radiation Oncology

## 2013-08-03 ENCOUNTER — Ambulatory Visit (HOSPITAL_BASED_OUTPATIENT_CLINIC_OR_DEPARTMENT_OTHER): Payer: Medicare Other

## 2013-08-03 ENCOUNTER — Other Ambulatory Visit: Payer: Self-pay | Admitting: Internal Medicine

## 2013-08-03 DIAGNOSIS — Z5111 Encounter for antineoplastic chemotherapy: Secondary | ICD-10-CM

## 2013-08-03 DIAGNOSIS — C3410 Malignant neoplasm of upper lobe, unspecified bronchus or lung: Secondary | ICD-10-CM

## 2013-08-03 LAB — CBC WITH DIFFERENTIAL/PLATELET
Basophils Absolute: 0 10*3/uL (ref 0.0–0.1)
EOS%: 0 % (ref 0.0–7.0)
Eosinophils Absolute: 0 10*3/uL (ref 0.0–0.5)
HCT: 27.8 % — ABNORMAL LOW (ref 38.4–49.9)
HGB: 9.1 g/dL — ABNORMAL LOW (ref 13.0–17.1)
MCH: 30.5 pg (ref 27.2–33.4)
MCV: 93.3 fL (ref 79.3–98.0)
MONO%: 13.7 % (ref 0.0–14.0)
NEUT%: 71.4 % (ref 39.0–75.0)
Platelets: 176 10*3/uL (ref 140–400)

## 2013-08-03 LAB — COMPREHENSIVE METABOLIC PANEL (CC13)
Albumin: 3.4 g/dL — ABNORMAL LOW (ref 3.5–5.0)
Alkaline Phosphatase: 52 U/L (ref 40–150)
BUN: 25.2 mg/dL (ref 7.0–26.0)
CO2: 26 mEq/L (ref 22–29)
Glucose: 79 mg/dl (ref 70–140)
Total Bilirubin: 0.88 mg/dL (ref 0.20–1.20)

## 2013-08-03 MED ORDER — SODIUM CHLORIDE 0.9 % IV SOLN
Freq: Once | INTRAVENOUS | Status: AC
  Administered 2013-08-03: 12:00:00 via INTRAVENOUS

## 2013-08-03 MED ORDER — DEXAMETHASONE SODIUM PHOSPHATE 20 MG/5ML IJ SOLN
20.0000 mg | Freq: Once | INTRAMUSCULAR | Status: AC
  Administered 2013-08-03: 20 mg via INTRAVENOUS

## 2013-08-03 MED ORDER — ONDANSETRON 16 MG/50ML IVPB (CHCC)
16.0000 mg | Freq: Once | INTRAVENOUS | Status: AC
  Administered 2013-08-03: 16 mg via INTRAVENOUS

## 2013-08-03 MED ORDER — SODIUM CHLORIDE 0.9 % IV SOLN
100.0000 mg/m2 | Freq: Once | INTRAVENOUS | Status: AC
  Administered 2013-08-03: 190 mg via INTRAVENOUS
  Filled 2013-08-03: qty 9.5

## 2013-08-03 MED ORDER — SODIUM CHLORIDE 0.9 % IV SOLN
330.0000 mg | Freq: Once | INTRAVENOUS | Status: AC
  Administered 2013-08-03: 330 mg via INTRAVENOUS
  Filled 2013-08-03: qty 33

## 2013-08-03 NOTE — Patient Instructions (Addendum)
[facility] Discharge Instructions for Patients Receiving Chemotherapy  Today you received the following chemotherapy agents: Etoposide (VP-16) and Carboplatin   To help prevent nausea and vomiting after your treatment, we encourage you to take your nausea medication as directed by your physician   If you develop nausea and vomiting that is not controlled by your nausea medication, call the clinic.   BELOW ARE SYMPTOMS THAT SHOULD BE REPORTED IMMEDIATELY:  *FEVER GREATER THAN 100.5 F  *CHILLS WITH OR WITHOUT FEVER  NAUSEA AND VOMITING THAT IS NOT CONTROLLED WITH YOUR NAUSEA MEDICATION  *UNUSUAL SHORTNESS OF BREATH  *UNUSUAL BRUISING OR BLEEDING  TENDERNESS IN MOUTH AND THROAT WITH OR WITHOUT PRESENCE OF ULCERS  *URINARY PROBLEMS  *BOWEL PROBLEMS  UNUSUAL RASH Items with * indicate a potential emergency and should be followed up as soon as possible.  Feel free to call the clinic you have any questions or concerns. The clinic phone number is [phone].

## 2013-08-03 NOTE — Progress Notes (Signed)
Department of Radiation Oncology  Phone:  [phone] Fax:        [phone]  Weekly Treatment Note    Name: Cory [name] Date: 08/03/2013 MRN: [id number] DOB: 10-10-1929   Current dose: 55.8 Gy  Current fraction: 31   MEDICATIONS: Current Outpatient Prescriptions  Medication Sig Dispense Refill  . acetaminophen (TYLENOL) 500 MG tablet Take 500 mg by mouth every 6 (six) hours as needed for pain.      Marland Kitchen amLODipine (NORVASC) 5 MG tablet TAKE ONE TABLET BY MOUTH ONE TIME DAILY  90 tablet  0  . antiseptic oral rinse (BIOTENE) LIQD 15 mLs by Mouth Rinse route as needed.      Marland Kitchen aspirin 81 MG tablet Take 81 mg by mouth daily.        . Cetirizine HCl (ZYRTEC ALLERGY) 10 MG CAPS Take 1 capsule by mouth.      . Cholecalciferol (VITAMIN D) 2000 UNITS CAPS Take 1 capsule by mouth daily.       Marland Kitchen emollient (BIAFINE) cream Apply topically 2 (two) times daily.      . fentaNYL (DURAGESIC - DOSED MCG/HR) 12 MCG/HR Place 1 patch (12.5 mcg total) onto the skin every 3 (three) days.  10 patch  0  . fluconazole (DIFLUCAN) 100 MG tablet Take 2 tabs today, then 1 tab daily for 20 more days for thrush  22 tablet  0  . guaiFENesin (MUCINEX) 600 MG 12 hr tablet Take 1,200 mg by mouth daily.      Marland Kitchen HYDROcodone-acetaminophen (HYCET) 7.5-325 mg/15 ml solution Take 15 mLs by mouth 4 (four) times daily as needed for pain.  473 mL  0  . latanoprost (XALATAN) 0.005 % ophthalmic solution Place 1 drop into the left eye at bedtime.        . Multiple Vitamin (MULTIVITAMIN) capsule Take 1 capsule by mouth daily.        . potassium chloride SA (KLOR-CON M20) 20 MEQ tablet Take 1 tablet (20 mEq total) by mouth 2 (two) times daily.  180 tablet  3  . prochlorperazine (COMPAZINE) 10 MG tablet Take 1 tablet (10 mg total) by mouth every 6 (six) hours as needed.  60 tablet  0  . simvastatin (ZOCOR) 40 MG tablet       . sucralfate (CARAFATE) 1 GM/10ML suspension Take 1 g by mouth 4 (four) times daily.      Terrance Mass  Salicylate (MYOFLEX EX) Apply 1 application topically 2 (two) times daily as needed. For pain  AS NEEDED      . Alum & Mag Hydroxide-Simeth (MAGIC MOUTHWASH W/LIDOCAINE) SOLN Take 5 mLs by mouth 4 (four) times daily as needed.  250 mL  1  . hyaluronate sodium (RADIAPLEXRX) GEL Apply topically 2 (two) times daily.       No current facility-administered medications for this encounter.   Facility-Administered Medications Ordered in Other Encounters  Medication Dose Route Frequency Provider Last Rate Last Dose  . CARBOplatin (PARAPLATIN) 330 mg in sodium chloride 0.9 % 100 mL chemo infusion  330 mg Intravenous Once Si Gaul, MD 266 mL/hr at 08/03/13 1351 330 mg at 08/03/13 1351     ALLERGIES: Niacin   LABORATORY DATA:  Lab Results  Component Value Date   WBC 3.4* 08/03/2013   HGB 9.1* 08/03/2013   HCT 27.8* 08/03/2013   MCV 93.3 08/03/2013   PLT 176 08/03/2013   Lab Results  Component Value Date   NA 144 08/03/2013   K 4.1  08/03/2013   CL 115* 07/22/2013   CO2 26 08/03/2013   Lab Results  Component Value Date   ALT 16 08/03/2013   AST 20 08/03/2013   ALKPHOS 52 08/03/2013   BILITOT 0.88 08/03/2013     NARRATIVE: Cory [name] was seen today for weekly treatment management. The chart was checked and the patient's films were reviewed. The patient is doing reasonably well. He has some continued skin irritation in the supraclavicular region. He states that this actually is better. Recesses times discussing pain medication and he will use his short acting pain medication as needed. We also discussed his back/leg pain which is not directly related to his current disease or treatment.  PHYSICAL EXAMINATION: height is 5\' 9"  (1.753 m) and weight is 135 lb 4.8 oz (61.372 kg). His temperature is 97.5 F (36.4 C). His blood pressure is 111/65 and his pulse is 79. His oxygen saturation is 100%.      bright erythema present in the supraclavicular region, greater on the right. No significant areas  of moist desquamation.  ASSESSMENT: The patient is doing satisfactorily with treatment.  PLAN: We will continue with the patient's radiation treatment as planned. He will continue to use Biafine several times per day which really has been helping. He has 4 more fractions.

## 2013-08-03 NOTE — Progress Notes (Signed)
Cory [name] here with his wife and neighbor for weekly under treat check.  He has had 31 fractions to his lung.  He does have pain in his right hip/buttock/leg that he is rating at a 3/10 right now.  He says that it is much worse when he stands up.  He is wondering if the strength of the fentanyl patch can be increased.  He denies pain with swallowing but does have a dry mouth.  The skin on his neck is red with peeling and some green/brown scabs.  He is using biofine cream 3 times per day.

## 2013-08-04 ENCOUNTER — Ambulatory Visit (INDEPENDENT_AMBULATORY_CARE_PROVIDER_SITE_OTHER): Payer: Medicare Other | Admitting: Pulmonary Disease

## 2013-08-04 ENCOUNTER — Encounter: Payer: Self-pay | Admitting: Pulmonary Disease

## 2013-08-04 ENCOUNTER — Ambulatory Visit (INDEPENDENT_AMBULATORY_CARE_PROVIDER_SITE_OTHER)
Admission: RE | Admit: 2013-08-04 | Discharge: 2013-08-04 | Disposition: A | Discharge status: Home / Self Care | Payer: Medicare Other | Source: Ambulatory Visit | Attending: Pulmonary Disease | Admitting: Pulmonary Disease

## 2013-08-04 ENCOUNTER — Ambulatory Visit (HOSPITAL_BASED_OUTPATIENT_CLINIC_OR_DEPARTMENT_OTHER): Payer: Medicare Other

## 2013-08-04 ENCOUNTER — Ambulatory Visit
Admission: RE | Admit: 2013-08-04 | Discharge: 2013-08-04 | Disposition: A | Discharge status: Home / Self Care | Payer: Medicare Other | Source: Ambulatory Visit | Attending: Radiation Oncology | Admitting: Radiation Oncology

## 2013-08-04 VITALS — BP 110/60 | HR 106 | Temp 98.4°F | Ht 69.0 in | Wt 137.2 lb

## 2013-08-04 DIAGNOSIS — E7800 Pure hypercholesterolemia, unspecified: Secondary | ICD-10-CM

## 2013-08-04 DIAGNOSIS — I10 Essential (primary) hypertension: Secondary | ICD-10-CM

## 2013-08-04 DIAGNOSIS — C3490 Malignant neoplasm of unspecified part of unspecified bronchus or lung: Secondary | ICD-10-CM

## 2013-08-04 DIAGNOSIS — M1990 Unspecified osteoarthritis, unspecified site: Secondary | ICD-10-CM

## 2013-08-04 DIAGNOSIS — Z5111 Encounter for antineoplastic chemotherapy: Secondary | ICD-10-CM

## 2013-08-04 DIAGNOSIS — M545 Low back pain: Secondary | ICD-10-CM

## 2013-08-04 DIAGNOSIS — I872 Venous insufficiency (chronic) (peripheral): Secondary | ICD-10-CM

## 2013-08-04 DIAGNOSIS — C3410 Malignant neoplasm of upper lobe, unspecified bronchus or lung: Secondary | ICD-10-CM

## 2013-08-04 DIAGNOSIS — R634 Abnormal weight loss: Secondary | ICD-10-CM | POA: Insufficient documentation

## 2013-08-04 MED ORDER — FENTANYL 25 MCG/HR TD PT72: 1.0000 | MEDICATED_PATCH | TRANSDERMAL | Status: AC

## 2013-08-04 MED ORDER — DEXAMETHASONE SODIUM PHOSPHATE 10 MG/ML IJ SOLN
10.0000 mg | Freq: Once | INTRAMUSCULAR | Status: AC
  Administered 2013-08-04: 10 mg via INTRAVENOUS

## 2013-08-04 MED ORDER — SODIUM CHLORIDE 0.9 % IV SOLN
100.0000 mg/m2 | Freq: Once | INTRAVENOUS | Status: AC
  Administered 2013-08-04: 190 mg via INTRAVENOUS
  Filled 2013-08-04: qty 9.5

## 2013-08-04 MED ORDER — SODIUM CHLORIDE 0.9 % IV SOLN
Freq: Once | INTRAVENOUS | Status: AC
  Administered 2013-08-04: 13:00:00 via INTRAVENOUS

## 2013-08-04 MED ORDER — HYDROCODONE-ACETAMINOPHEN 5-325 MG PO TABS: 1.0000 | ORAL_TABLET | Freq: Four times a day (QID) | ORAL | Status: AC | PRN

## 2013-08-04 MED ORDER — ONDANSETRON 8 MG/50ML IVPB (CHCC)
8.0000 mg | Freq: Once | INTRAVENOUS | Status: AC
  Administered 2013-08-04: 8 mg via INTRAVENOUS

## 2013-08-04 NOTE — Progress Notes (Signed)
Subjective:    Patient ID: Cory [name], male    DOB: 23-Oct-1929, 79 y.o.   MRN: [id number]  HPI 79 y/o WM here for a follow up visit... he has multiple medical problems as noted below...   ~  Apr 18, 2010:  remarkably stable & still smokes 1ppd, playing tennis regularly in a league & no c/o dyspnea, CP, etc...  BP controlled on meds;  due for f/u FLP on his Simva40- taking 1/2 tab daily;  BPH followed by DrKimbrough &he reports PSA's have been normal;  notes some DJD/ LBP & saw DrRendall 1 mo ago w/ Dosepak that resolved the issue... no new complaints or concerns x some "muscle spasm" in feet- offered Soma Rx but he declines new medications & will let us know.  ~  May 14, 2011:  Yearly ROV & he reports stable, doing well he says... Still smoking 1-1.5ppd w/ min cough/ phlegm/ dyspnea; plays tennis regularly w/o deterioration etc;   He notes some nocturnal leg cramps relieved by a banana & we discussed quinine vs mustard Qhs;  Due for f/u CXR & will ret for fasting blood work...  meds refilled for 90d supplies per request... CXR is OK today w/o lesions;  Labs are OK as well but K=3.4 & he's supposed to be on K20Bid already...   ~  June 16, 2012:  45mo ROV & Cory [name] has had a good yr overall> main prob was back pain "siatica" he says w/ trip to ER 7/12 & f/u DrBotero & DrRendall- we don't have any notes but he tells me that MRI done in the trailer behind Botero's office showed "siatica" & he has some weakness in right foot w/ foot drop- offered phys therapy but he declined & will pursue this thru church friend...    He brought a list of questions from wife> he stopped niacin after article in news saying it didn't help; he decided against lifeline screen this yr; he has some leg cramps on & off- using potassium & bananas which seem to help; he has itchy spots & uses witch hazel w/ relief "it helps dogs too"; using Lizard Juice E-cig to help w/ smoking cessation & he reports down 40% so far...    BP  controlled on Amlodipine; needs to ret fasting for FLP on Simva20...    We reviewed prob list, meds, xrays and labs> see below>> CXR 7/13 showed essent norm heart size, clear lungs w/ some mild bibasilar scarring, DJD in spine... LABS 7/13:  FLP- ok x HDL=33 on Simva20;  Chems- ok w/ BS=101 A1c=6.3;  CBC- ok;  TSH=1.46;  PSA=0.33   ~  Apr 27, 2013:  67mo ROV & add-on appt requested for knot in the neck> he reports recent dental problems, infected tooth right maxilla where implant done in past, this has been manipulated by the dentist, on antibiotics, & plans surg next week (to remove the titanium post); notes a swelling/ knot in right mid neck x1wk- firm ?LN mid neck, denies f/c/s, denies hoarseness/ dysphagia, denies CP/SOB/ cough phlegm hemoptysis... We discussed need for screening labs, CT soft tissues of the neck w/ contrast & then further eval once this is known... LABS 5/14 showed BMet- normal w/ Cr=1.0;  CBC- wnl w/ Hg=14.5, WBC=10K;  Sed=15... CT Soft Tissues of Neck 5/15 showed right neck adenopathy & right paratracheal adenopathy displacing the SVC; incidental note made of a right thyroid lesion, atheromatous changes in great vessels, cervical spondylosis... WE WILL PROCEED w/ CXR &  CT CHEST w/ contrast => then decide on approach for Bx prior to Oncology & XRT referral... ADDENDUM >> CXR - wasn't done as pt forgot to go to [facility] Dept for the film!!! This needs to be completed when able... CT Chest w/ Contast 05/03/13 showed extensive mediastinal, right hilar, right cervical & right supraclav adenopathy; "patchy nodularity" in RUL w/o mass seen; note is made of atherosclerotic dis in vessels, right renal cyst (intermed attenuation)... BIOPSY by IR 05/06/13 of right supraclavicular node => POS for SMALL CELL CARCINOMA and we will arrange for cancer center referral, DrMohammed.  ~  July 05, 2013:  3mo ROV & Cory [name] is in the middle of his Chemotherapy & XRT for Small Cell Lung Cancer- under the  direction of DrMohammed & DrMoody- their notes are reviewed & discussed w/ pt & his wife;  He had f/u CT Chest 07/02/13 w/ improvement;  His appetite is fair & he has lost 8# down to 152# (we discussed protein supplements);  He has started to lose his hair;  He is still smoking- 2/3 ppd & has Wellbutrin to tryu (rec to him by [place] sharpless)He has mult somatic complaints today which is very unusual for him- itching, blurry left eye, lack of concentration, lightheaded, some dizziness, siatic nerve prob w/ tingling right foot & swelling...     We reviewed prob list, meds, xrays and labs> see below for updates >> we reviewed his med list today & offered to remain avail to him for support during this difficult time...  ~  August 04, 2013:  28mo ROV & post hosp check> Cory [name] was [place] x2 in the interim w/ dehydration, electrolyte abn, renal insuffic, confusion & pretty much just flipped-out, had to be escorted from the hosp by police, made it very difficult for wife etc... When all is said & done- he ultimately got back w/ DrMohammed to resume chemoRx after a short break & is finishing his XRT from [facility] this week... His wt is down to 137# & has the expected alopecia- we discussed nutritional supplements etc; he requests DURAGESIC-25 and NORCO5 Q6h as needed for breakthrough pain...  We reviewed prob list, meds, xrays and labs> see below for updates >>  LABS reviewed in EPIC... CXR today showed norm heart size, clear lungs w/ mild bibasilar scarring, DJD in spine...          Problem List:  GLAUCOMA - on drops per DrCashwell...  Hx of OBSTRUCTIVE SLEEP APNEA (ICD-327.23) - hx mild OSA on sleep study 2001w/ RDI= 14 and desat to 85%... eval DrClance- rec weight loss & exerc, not on CPAP... he notes that he rests well & denies daytime hypersomnolence, snoring, etc...  COPD (ICD-496) & CIGARETTE SMOKER (ICD-305.1) - not currently on any meds... longtime smoker ~ 1-1.5 ppd w/ min cough/ phlegm/ DOE without  change... he refuses smoking cessation help stating that he got depressed when he tried quitting in the past... baseline CXR without acute changes, and prev PFT's 2/05 showed FVC= 2.88 (75%), FEV1= 2.20 (72%), %1sec=76, mid-flows= 55% pred...  ~  CXR 5/11 showed atherosclerotic changes, sl incr markings, DJD sp, NAD. ~  CXR 5/12 showed clear lungs, NAD. ~  CXR 7/13 showed essent norm heart size, clear lungs w/ some mild bibasilar scarring, DJD in spine... ~  7/13: he is trying [employer] & states that he is down 40% currently... ~  Still smoking ~1ppd despite all efforts to get him to quit... ~  CXR 8/14 showed norm  heart size, clear lungs w/ mild bibasilar scarring, DJD in spine.  5/14 presentation w/ right neck adenopathy => Metastatic small cell lung cancer >>  ~  CT Chest 5/14 revealed extensive mediastinal, right hilar, right cervical & right supraclav adenopathy; "patchy nodularity" in RUL w/o mass seen; note is made of atherosclerotic dis in vessels, right renal cyst (intermed attenuation) ~  BIOPSY by IR 05/06/13 of right supraclavicular node => POS for SMALL CELL CARCINOMA and we will arrange for cancer center referral, DrMohammed. ~  PET scan 6/14 w/ hypermetabolic bulky right paratracheal & supraclav adenopathy, hypermetabolic nodule in RUL & an area of hypermetabolism within the left lobe of the thyroid... ~  MRI Head 6/14 showed mod atrophy, extensive chr microvasc ischemic changes, no mets ~  6/14:  He was started on chemotherapy by DrMohammed & XRT by [facility]... ~  7/14:  Follow up CT Chest showed marked reduction in volume of bulky mediastinal adenopathy, no evid of dis progression...  HYPERTENSION (ICD-401.9) - controlled on ASA 81mg /d, AMLODIPINE 5mg /d, & KCl Bid... ] ~  5/12:  BP= 122/70 not checking BP at home> denies HA, fatigue, visual changes, CP, palipit, dizziness, syncope, dyspnea, edema, etc; he plays tennis 2x/wk... ~  7/13:  BP= 138/78 & continues to deny  CP, palpit, dizzy, SOB, edema, etc... ~  7/14:  BP= 118/72 ~  8/14:  BP= 110/60  VENOUS INSUFFICIENCY (ICD-459.81) - he follows a low sodium diet, elevates when able and wears support hose as needed.  HYPERCHOLESTEROLEMIA (ICD-272.0) - on SIMVASTATIN 40mg /d- taking 1/2 tab daily & he added Niacin 250mg /d, but has since stopped. ~  FLP 7/08 on Vytorin10-20 showed TChol 102, TG 94, HDL 28, LDL 55 ~  FLP 9/09 on Simvast40-1/2 showed TChol 126, TG 86, HDL 30, LDL 79... rec- same med. ~  FLP 5/11 on Simva40-1/2 showed TChol 134, TG 101, HDL 36, LDL 77 ~  FLP 5/12 on Simva40- 1/2 showed TChol 106, TG 92, HDL 40, LDL 48 ~  FLP 7/13 on Simva20 showed TChol 122, TG 96, HDL 33, LDL 70  DIABETES MELLITUS, BORDERLINE (ICD-790.29) - on diet alone... ~  labs 7/08 showed BS= 125, HgA1c= 6.5 ~  labs 9/09 showed BS= 133, HgA1c= 6.2 ~  labs 5/11 showed BS= 111, A1c= 6.4 ~  Labs 5/12 showed BS= 108 ~  Labs 7/13 showed BS= 101, A1c= 6.3  DIVERTICULOSIS OF COLON (ICD-562.10) & COLONIC POLYPS (ICD-211.3) -  ~  colonoscopy 9/04 showed divertics & several 4-29mm polyps= hyperpl and tubular adenomas... ~  f/u colonoscopy 10/09 by DrStark showed divertics, 3mm polyp= tubular adenoma, +hems...  BENIGN PROSTATIC HYPERTROPHY, HX OF (ICD-V13.8) - followed by DrKimbrough et al... last note from Urology= 11/09 w/ Dx= Renal cyst & Obsrtuctive Uropathy (mild BPH) & +FamHx prostate Ca in father... hx mild hematuria w/ cysto in 2007 showing a sm diverticulum off the left base of the bladder... ~  Labs 5/11 showed PSA= 0.41 ~  He tells me that DrKimbrough stopped doing PSAs & he's OK w/ this... ~  Labs 7/13 showed PSA= 0.33  DEGENERATIVE JOINT DISEASE (ICD-715.90) - uses OTC NSAIDs as needed... LBP & "Siatica" per DrBotero >> he had lumbar lam 1997 by DrRobinson ~  He notes LBP and went to ER 7/12> XRays showed postop & degen changes in lumbar spine w/ mild ant sublux L4 on L5; XRay Sacrum showed no displacement or fx;  XRay of R Hip showed degen changes w/o fx etc;  He went  to [facility] w/ MRI done (we don't have any records) & pt states it showed "siatica", given shots by [facility] w/ some benefit...  BACK PAIN, LUMBAR (ICD-724.2) - hx of HNP/ sp stenosis... s/p lumbar lam 1997 by DrRobinson... ~  5/11: he reports LBP ~41mo ago Rx by DrRendall w/ Dosepak & symptoms resolved.  Hx of LEG CRAMPS (ICD-729.82) - he uses KCl w/ relief (2 tabs daily) + Bananas... hx of rash from Quinine...  VITAMIN D DEFICIENCY - on Vit D 2000 u daily... ~  Labs 5/11 showed Vit D level = 18... rec to start 2000u OTC supplement daily. ~  Labs 5/12 showed Vit D level = 39... Continue daily supplement   SKIN CANCER, HX OF (ICD-V10.83) - s/p basal cell on scalp, Dr Donzetta Starch...  Hx of SHINGLES - right T4 distrib shingles in 2000...   Past Surgical History  Procedure Laterality Date  . Appendectomy    . Tonsillectomy and adenoidectomy    . Left inguinal hernia repair  1998    Dr. Earlene Plater  . Lumbar laminectomy  1997    Dr. Roxan Hockey  . Lymph node biopsy  05/06/2013    Outpatient Encounter Prescriptions as of 08/04/2013  Medication Sig Dispense Refill  . acetaminophen (TYLENOL) 500 MG tablet Take 500 mg by mouth every 6 (six) hours as needed for pain.      Marland Kitchen Alum & Mag Hydroxide-Simeth (MAGIC MOUTHWASH W/LIDOCAINE) SOLN Take 5 mLs by mouth 4 (four) times daily as needed.  250 mL  1  . amLODipine (NORVASC) 5 MG tablet TAKE ONE TABLET BY MOUTH ONE TIME DAILY  90 tablet  0  . antiseptic oral rinse (BIOTENE) LIQD 15 mLs by Mouth Rinse route as needed.      Marland Kitchen aspirin 81 MG tablet Take 81 mg by mouth daily.        . Cetirizine HCl (ZYRTEC ALLERGY) 10 MG CAPS Take 1 capsule by mouth.      . Cholecalciferol (VITAMIN D) 2000 UNITS CAPS Take 1 capsule by mouth daily.       Marland Kitchen emollient (BIAFINE) cream Apply topically 2 (two) times daily.      . fentaNYL (DURAGESIC - DOSED MCG/HR) 12 MCG/HR Place 1 patch (12.5 mcg total) onto the skin  every 3 (three) days.  10 patch  0  . fluconazole (DIFLUCAN) 100 MG tablet Take 2 tabs today, then 1 tab daily for 20 more days for thrush  22 tablet  0  . guaiFENesin (MUCINEX) 600 MG 12 hr tablet Take 1,200 mg by mouth daily.      . hyaluronate sodium (RADIAPLEXRX) GEL Apply topically 2 (two) times daily.      Marland Kitchen HYDROcodone-acetaminophen (HYCET) 7.5-325 mg/15 ml solution Take 15 mLs by mouth 4 (four) times daily as needed for pain.  473 mL  0  . latanoprost (XALATAN) 0.005 % ophthalmic solution Place 1 drop into the left eye at bedtime.        . Multiple Vitamin (MULTIVITAMIN) capsule Take 1 capsule by mouth daily.        . potassium chloride SA (KLOR-CON M20) 20 MEQ tablet Take 1 tablet (20 mEq total) by mouth 2 (two) times daily.  180 tablet  3  . prochlorperazine (COMPAZINE) 10 MG tablet Take 1 tablet (10 mg total) by mouth every 6 (six) hours as needed.  60 tablet  0  . simvastatin (ZOCOR) 40 MG tablet       . sucralfate (CARAFATE) 1 GM/10ML suspension  Take 1 g by mouth 4 (four) times daily.      Terrance Mass Salicylate (MYOFLEX EX) Apply 1 application topically 2 (two) times daily as needed. For pain  AS NEEDED       No facility-administered encounter medications on file as of 08/04/2013.    Allergies  Allergen Reactions  . Niacin     REACTION: Intol to Niacin w/ flushing in large doses    Current Medications, Allergies, Past Medical History, Past Surgical History, Family History, and Social History were reviewed in [employer] record.   Review of Systems       See HPI - all other systems neg except as noted... The patient notes intermittent back pain;  He denies anorexia, fever, weight loss, weight gain, vision loss, decreased hearing, hoarseness, chest pain, syncope, dyspnea on exertion, peripheral edema, prolonged cough, headaches, hemoptysis, abdominal pain, melena, hematochezia, severe indigestion/heartburn, hematuria, incontinence, muscle weakness,  suspicious skin lesions, transient blindness, difficulty walking, depression, unusual weight change, abnormal bleeding, enlarged lymph nodes, and angioedema.     Objective:   Physical Exam     WD, WN, 78 y/o WM in NAD...  GENERAL:  Alert & oriented; pleasant & cooperative... HEENT:  [place]/AT, EOM-wnl, PERRLA, EACs-clear, TMs-wnl, NOSE-clear, THROAT- sl red w/o exud, +dental issues w/ titanium post exposure right maxilla... NECK:  Supple w/ fairROM; no JVD; normal carotid impulses w/o bruits; no thyromegaly, +right mid neck adenopathy- non tender CHEST:  Clear to P & A; without wheezes/ rales/ or rhonchi heard... HEART:  Regular Rhythm; without murmurs/ rubs/ or gallops detected... ABDOMEN:  Soft & nontender; normal bowel sounds; no organomegaly or masses palpated... He has a "touchey" abd and is difficult to examine... (RECTAL:  Neg - prostate 3+ & nontender w/o nodules; stool hematest neg) EXT: without deformities, mild arthritic changes; no varicose veins/ venous insuffic/ or edema. NEURO:  CN's intact; motor testing normal; sensory testing normal; gait normal & balance OK. DERM:  No lesions noted; no rash etc...  RADIOLOGY DATA:  Reviewed in the EPIC EMR & discussed w/ the patient...  LABORATORY DATA:  Reviewed in the EPIC EMR & discussed w/ the patient...   Assessment & Plan:    SMALL CELL LUNG CANCER >> he is currently getting Chemotherapy from DrMohammed, and XRT from [facility]...   COPD/ Cig Smoker> he is asymptomatic but still smoking now <1ppd using e-cig, not interestred in other smoking cessation help etc...  HBP>  Controlled on meds, continue same + low sodium etc...  CHOL>  Looks great on diet + Simva20...  DM>  Remains diet controlled...  GI>  Followed by DrStark & up to date...  DJD> he is more sedentary due to the chemo- no arthritic complaints at present...  Leg Cramps>  He uses the KCl + bananas etc...  Other medical issues as noted...   Patient's  Medications  New Prescriptions   FENTANYL (DURAGESIC - DOSED MCG/HR) 25 MCG/HR PATCH    Place 1 patch (25 mcg total) onto the skin every 3 (three) days.   HYDROCODONE-ACETAMINOPHEN (NORCO/VICODIN) 5-325 MG PER TABLET    Take 1 tablet by mouth every 6 (six) hours as needed for pain.  Previous Medications   ACETAMINOPHEN (TYLENOL) 500 MG TABLET    Take 500 mg by mouth every 6 (six) hours as needed for pain.   ALUM & MAG HYDROXIDE-SIMETH (MAGIC MOUTHWASH W/LIDOCAINE) SOLN    Take 5 mLs by mouth 4 (four) times daily as needed.   AMLODIPINE (NORVASC)  5 MG TABLET    TAKE ONE TABLET BY MOUTH ONE TIME DAILY   ANTISEPTIC ORAL RINSE (BIOTENE) LIQD    15 mLs by Mouth Rinse route as needed.   ASPIRIN 81 MG TABLET    Take 81 mg by mouth daily.     CETIRIZINE HCL (ZYRTEC ALLERGY) 10 MG CAPS    Take 1 capsule by mouth.   CHOLECALCIFEROL (VITAMIN D) 2000 UNITS CAPS    Take 1 capsule by mouth daily.    EMOLLIENT (BIAFINE) CREAM    Apply topically 2 (two) times daily.   FLUCONAZOLE (DIFLUCAN) 100 MG TABLET    Take 2 tabs today, then 1 tab daily for 20 more days for thrush   GUAIFENESIN (MUCINEX) 600 MG 12 HR TABLET    Take 1,200 mg by mouth daily.   HYALURONATE SODIUM (RADIAPLEXRX) GEL    Apply topically 2 (two) times daily.   LATANOPROST (XALATAN) 0.005 % OPHTHALMIC SOLUTION    Place 1 drop into the left eye at bedtime.     MULTIPLE VITAMIN (MULTIVITAMIN) CAPSULE    Take 1 capsule by mouth daily.     POTASSIUM CHLORIDE SA (KLOR-CON M20) 20 MEQ TABLET    Take 1 tablet (20 mEq total) by mouth 2 (two) times daily.   PROCHLORPERAZINE (COMPAZINE) 10 MG TABLET    Take 1 tablet (10 mg total) by mouth every 6 (six) hours as needed.   SIMVASTATIN (ZOCOR) 40 MG TABLET    Take 40 mg by mouth at bedtime.    SUCRALFATE (CARAFATE) 1 GM/10ML SUSPENSION    Take 1 g by mouth 4 (four) times daily.   TROLAMINE SALICYLATE (MYOFLEX EX)    Apply 1 application topically 2 (two) times daily as needed. For pain  AS NEEDED  Modified  Medications   No medications on file  Discontinued Medications   FENTANYL (DURAGESIC - DOSED MCG/HR) 12 MCG/HR    Place 1 patch (12.5 mcg total) onto the skin every 3 (three) days.   HYDROCODONE-ACETAMINOPHEN (HYCET) 7.5-325 MG/15 ML SOLUTION    Take 15 mLs by mouth 4 (four) times daily as needed for pain.

## 2013-08-04 NOTE — Patient Instructions (Addendum)
[facility] Discharge Instructions for Patients Receiving Chemotherapy  Today you received the following chemotherapy agents: Etoposide.  To help prevent nausea and vomiting after your treatment, we encourage you to take your nausea medication as prescribed.   If you develop nausea and vomiting that is not controlled by your nausea medication, call the clinic.   BELOW ARE SYMPTOMS THAT SHOULD BE REPORTED IMMEDIATELY:  *FEVER GREATER THAN 100.5 F  *CHILLS WITH OR WITHOUT FEVER  NAUSEA AND VOMITING THAT IS NOT CONTROLLED WITH YOUR NAUSEA MEDICATION  *UNUSUAL SHORTNESS OF BREATH  *UNUSUAL BRUISING OR BLEEDING  TENDERNESS IN MOUTH AND THROAT WITH OR WITHOUT PRESENCE OF ULCERS  *URINARY PROBLEMS  *BOWEL PROBLEMS  UNUSUAL RASH Items with * indicate a potential emergency and should be followed up as soon as possible.  Feel free to call the clinic you have any questions or concerns. The clinic phone number is [phone].    

## 2013-08-04 NOTE — Patient Instructions (Addendum)
Today we updated your med list in our EPIC system...    We adjusted your DURAGESIC up to the patch- still one patch every 3d...  We also changed the liq hydrocodone to the pill form- take one every 6h AS NEEDED for breakthrough pain...  Today we did a follow up CXR...    We will contact you w/ the results when available...   Call for any questions...  Let's plan a follow up visit in 10mo , sooner if needed for problems.Marland Kitchen

## 2013-08-05 ENCOUNTER — Ambulatory Visit (HOSPITAL_BASED_OUTPATIENT_CLINIC_OR_DEPARTMENT_OTHER): Payer: Medicare Other

## 2013-08-05 ENCOUNTER — Ambulatory Visit: Payer: Medicare Other

## 2013-08-05 ENCOUNTER — Inpatient Hospital Stay: Payer: Medicare Other | Admitting: Pulmonary Disease

## 2013-08-05 DIAGNOSIS — C3410 Malignant neoplasm of upper lobe, unspecified bronchus or lung: Secondary | ICD-10-CM

## 2013-08-05 DIAGNOSIS — Z5111 Encounter for antineoplastic chemotherapy: Secondary | ICD-10-CM

## 2013-08-05 MED ORDER — HEPARIN SOD (PORK) LOCK FLUSH 100 UNIT/ML IV SOLN
250.0000 [IU] | Freq: Once | INTRAVENOUS | Status: DC | PRN
  Filled 2013-08-05: qty 5

## 2013-08-05 MED ORDER — SODIUM CHLORIDE 0.9 % IJ SOLN
3.0000 mL | INTRAMUSCULAR | Status: DC | PRN
  Filled 2013-08-05: qty 10

## 2013-08-05 MED ORDER — SODIUM CHLORIDE 0.9 % IV SOLN: Freq: Once | INTRAVENOUS | Status: DC

## 2013-08-05 MED ORDER — SODIUM CHLORIDE 0.9 % IV SOLN
100.0000 mg/m2 | Freq: Once | INTRAVENOUS | Status: AC
  Administered 2013-08-05: 190 mg via INTRAVENOUS
  Filled 2013-08-05: qty 9.5

## 2013-08-05 MED ORDER — HEPARIN SOD (PORK) LOCK FLUSH 100 UNIT/ML IV SOLN
500.0000 [IU] | Freq: Once | INTRAVENOUS | Status: DC | PRN
  Filled 2013-08-05: qty 5

## 2013-08-05 MED ORDER — SODIUM CHLORIDE 0.9 % IJ SOLN
10.0000 mL | INTRAMUSCULAR | Status: DC | PRN
  Filled 2013-08-05: qty 10

## 2013-08-05 MED ORDER — ALTEPLASE 2 MG IJ SOLR
2.0000 mg | Freq: Once | INTRAMUSCULAR | Status: DC | PRN
  Filled 2013-08-05: qty 2

## 2013-08-05 MED ORDER — ONDANSETRON 8 MG/50ML IVPB (CHCC)
8.0000 mg | Freq: Once | INTRAVENOUS | Status: AC
  Administered 2013-08-05: 8 mg via INTRAVENOUS

## 2013-08-05 MED ORDER — DEXAMETHASONE SODIUM PHOSPHATE 10 MG/ML IJ SOLN
10.0000 mg | Freq: Once | INTRAMUSCULAR | Status: AC
  Administered 2013-08-05: 10 mg via INTRAVENOUS

## 2013-08-05 NOTE — Patient Instructions (Addendum)
[facility] Discharge Instructions for Patients Receiving Chemotherapy  Today you received the following chemotherapy agents: etoposide  To help prevent nausea and vomiting after your treatment, we encourage you to take your nausea medication.    If you develop nausea and vomiting that is not controlled by your nausea medication, call the clinic.   BELOW ARE SYMPTOMS THAT SHOULD BE REPORTED IMMEDIATELY:  *FEVER GREATER THAN 100.5 F  *CHILLS WITH OR WITHOUT FEVER  NAUSEA AND VOMITING THAT IS NOT CONTROLLED WITH YOUR NAUSEA MEDICATION  *UNUSUAL SHORTNESS OF BREATH  *UNUSUAL BRUISING OR BLEEDING  TENDERNESS IN MOUTH AND THROAT WITH OR WITHOUT PRESENCE OF ULCERS  *URINARY PROBLEMS  *BOWEL PROBLEMS  UNUSUAL RASH Items with * indicate a potential emergency and should be followed up as soon as possible.  Feel free to call the clinic you have any questions or concerns. The clinic phone number is [phone].

## 2013-08-06 ENCOUNTER — Ambulatory Visit
Admission: RE | Admit: 2013-08-06 | Discharge: 2013-08-06 | Disposition: A | Discharge status: Home / Self Care | Payer: Medicare Other | Source: Ambulatory Visit | Attending: Radiation Oncology | Admitting: Radiation Oncology

## 2013-08-06 ENCOUNTER — Ambulatory Visit (HOSPITAL_BASED_OUTPATIENT_CLINIC_OR_DEPARTMENT_OTHER): Payer: Medicare Other

## 2013-08-06 ENCOUNTER — Encounter: Payer: Self-pay | Admitting: *Deleted

## 2013-08-06 ENCOUNTER — Telehealth: Payer: Self-pay | Admitting: Pulmonary Disease

## 2013-08-06 DIAGNOSIS — Z5189 Encounter for other specified aftercare: Secondary | ICD-10-CM

## 2013-08-06 DIAGNOSIS — C3410 Malignant neoplasm of upper lobe, unspecified bronchus or lung: Secondary | ICD-10-CM

## 2013-08-06 DIAGNOSIS — C3491 Malignant neoplasm of unspecified part of right bronchus or lung: Secondary | ICD-10-CM

## 2013-08-06 MED ORDER — BIAFINE EX EMUL: CUTANEOUS | Status: DC | PRN

## 2013-08-06 MED ORDER — BIAFINE EX EMUL
CUTANEOUS | Status: DC | PRN
  Administered 2013-08-06: 13:00:00 via TOPICAL

## 2013-08-06 MED ORDER — PEGFILGRASTIM INJECTION 6 MG/0.6ML
6.0000 mg | Freq: Once | SUBCUTANEOUS | Status: AC
  Administered 2013-08-06: 6 mg via SUBCUTANEOUS
  Filled 2013-08-06: qty 0.6

## 2013-08-06 NOTE — Progress Notes (Signed)
pateint came to nursing requesting another biafine cream tube, given

## 2013-08-06 NOTE — Telephone Encounter (Signed)
I spoke with the pt spouse and she states that when she called this morning that she was concerned because they had a rough night where the pt had stated that he does not want to do this anymore and was having difficulty coping with everything. She states they had a long talk about things later today and he has agreed to follow the rule and recs of his docs, take meds as prescribed etc. She states nothing further is needed at this time. I advised that if anything else is needed to please let us know.Carron Curie, CMA

## 2013-08-08 ENCOUNTER — Encounter: Payer: Self-pay | Admitting: Pulmonary Disease

## 2013-08-09 ENCOUNTER — Ambulatory Visit
Admission: RE | Admit: 2013-08-09 | Discharge: 2013-08-09 | Disposition: A | Discharge status: Home / Self Care | Payer: Medicare Other | Source: Ambulatory Visit | Attending: Radiation Oncology | Admitting: Radiation Oncology

## 2013-08-10 ENCOUNTER — Encounter: Payer: Self-pay | Admitting: Radiation Oncology

## 2013-08-10 ENCOUNTER — Telehealth: Payer: Self-pay | Admitting: Pulmonary Disease

## 2013-08-10 ENCOUNTER — Other Ambulatory Visit: Payer: Self-pay | Admitting: *Deleted

## 2013-08-10 ENCOUNTER — Other Ambulatory Visit (HOSPITAL_BASED_OUTPATIENT_CLINIC_OR_DEPARTMENT_OTHER): Payer: Medicare Other | Admitting: Lab

## 2013-08-10 ENCOUNTER — Ambulatory Visit
Admission: RE | Admit: 2013-08-10 | Discharge: 2013-08-10 | Disposition: A | Discharge status: Home / Self Care | Payer: Medicare Other | Source: Ambulatory Visit | Attending: Radiation Oncology | Admitting: Radiation Oncology

## 2013-08-10 DIAGNOSIS — C3490 Malignant neoplasm of unspecified part of unspecified bronchus or lung: Secondary | ICD-10-CM

## 2013-08-10 DIAGNOSIS — C3491 Malignant neoplasm of unspecified part of right bronchus or lung: Secondary | ICD-10-CM

## 2013-08-10 LAB — COMPREHENSIVE METABOLIC PANEL (CC13)
ALT: 23 U/L (ref 0–55)
Albumin: 3.4 g/dL — ABNORMAL LOW (ref 3.5–5.0)
CO2: 27 mEq/L (ref 22–29)
Calcium: 9.1 mg/dL (ref 8.4–10.4)
Chloride: 108 mEq/L (ref 98–109)
Potassium: 3.6 mEq/L (ref 3.5–5.1)
Sodium: 151 mEq/L — ABNORMAL HIGH (ref 136–145)
Total Bilirubin: 1.98 mg/dL — ABNORMAL HIGH (ref 0.20–1.20)
Total Protein: 6.1 g/dL — ABNORMAL LOW (ref 6.4–8.3)

## 2013-08-10 LAB — CBC WITH DIFFERENTIAL/PLATELET
Eosinophils Absolute: 0 10*3/uL (ref 0.0–0.5)
HGB: 9.5 g/dL — ABNORMAL LOW (ref 13.0–17.1)
MONO#: 0 10*3/uL — ABNORMAL LOW (ref 0.1–0.9)
NEUT#: 0.5 10*3/uL — CL (ref 1.5–6.5)
RBC: 3.06 10*6/uL — ABNORMAL LOW (ref 4.20–5.82)
RDW: 18.6 % — ABNORMAL HIGH (ref 11.0–14.6)
WBC: 0.5 10*3/uL — CL (ref 4.0–10.3)
nRBC: 0 % (ref 0–0)

## 2013-08-10 MED ORDER — CIPROFLOXACIN HCL 500 MG PO TABS: 500.0000 mg | ORAL_TABLET | Freq: Two times a day (BID) | ORAL | Status: AC

## 2013-08-10 MED ORDER — BIAFINE EX EMUL
CUTANEOUS | Status: DC | PRN
  Administered 2013-08-10: 15:00:00 via TOPICAL

## 2013-08-10 MED ORDER — SODIUM CHLORIDE 0.45 % IV SOLN
INTRAVENOUS | Status: AC
  Administered 2013-08-10: 14:00:00 via INTRAVENOUS
  Filled 2013-08-10: qty 1000

## 2013-08-10 NOTE — Telephone Encounter (Signed)
Called and spoke with Cory [name] and she stated that the pt has been declining.  Pt is not eating or drinking.  Pt is acting out and was yelling at the doctor and nurses earlier at the cancer center.  His family is very concerned about him.  They want to know why he is acting out when he never did this before.  He just does not act like himself.  Family is very concerned.  pts wife wanted SN to review labs and wanted to know if he feels anything else should be done.  She is aware that i will call her back in the am with any recs from SN.  SN please advise. Thanks  Allergies  Allergen Reactions  . Niacin     REACTION: Intol to Niacin w/ flushing in large doses

## 2013-08-10 NOTE — Progress Notes (Signed)
   Weekly Management Note: Outpatient Current Dose:  63 Gy  Projected Dose: 63 Gy   Narrative:  The patient presents for routine under treatment assessment.  CBCT/MVCT images/Port film x-rays were reviewed.  The chart was checked.  Completed RT today. Patient almost left against medical advice before I saw him.  He is anxious to get home. Denies pain or SOB.  Only drinks ~6oz a day.  Orthostatic.  Renal failure per labs below. Also Cytopenic.   Physical Findings:  weight is 124 lb 12.8 oz (56.609 kg). His temperature is 97.7 F (36.5 C). His blood pressure is 96/67 and his pulse is 121. His oxygen saturation is 99%.  In wheelchair, non toxic appearing.  CBC    Component Value Date/Time   WBC 0.5* 08/10/2013 1121   WBC 5.7 07/22/2013 0421   RBC 3.06* 08/10/2013 1121   RBC 3.18* 07/22/2013 0421   HGB 9.5* 08/10/2013 1121   HGB 9.7* 07/22/2013 0421   HCT 29.1* 08/10/2013 1121   HCT 28.7* 07/22/2013 0421   PLT 42* 08/10/2013 1121   PLT 17* 07/22/2013 0421   MCV 95.1 08/10/2013 1121   MCV 90.3 07/22/2013 0421   MCH 31.0 08/10/2013 1121   MCH 30.5 07/22/2013 0421   MCHC 32.6 08/10/2013 1121   MCHC 33.8 07/22/2013 0421   RDW 18.6* 08/10/2013 1121   RDW 15.3 07/22/2013 0421   LYMPHSABS 0.0* 08/10/2013 1121   LYMPHSABS 0.2* 07/21/2013 0940   MONOABS 0.0* 08/10/2013 1121   MONOABS 0.4 07/21/2013 0940   EOSABS 0.0 08/10/2013 1121   EOSABS 0.0 07/21/2013 0940   BASOSABS 0.0 08/10/2013 1121   BASOSABS 0.0 07/21/2013 0940     CMP     Component Value Date/Time   NA 151* 08/10/2013 1121   NA 154* 07/22/2013 0421   K 3.6 08/10/2013 1121   K 3.2* 07/22/2013 0421   CL 115* 07/22/2013 0421   CL 104 06/08/2013 1426   CO2 27 08/10/2013 1121   CO2 31 07/22/2013 0421   GLUCOSE 152* 08/10/2013 1121   GLUCOSE 116* 07/22/2013 0421   GLUCOSE 97 06/08/2013 1426   GLUCOSE 137* 12/15/2006 1022   BUN 59.9* 08/10/2013 1121   BUN 32* 07/22/2013 0421   CREATININE 1.4* 08/10/2013 1121   CREATININE 0.99 07/22/2013 0421   CALCIUM 9.1 08/10/2013 1121   CALCIUM 8.9 07/22/2013 0421   PROT 6.1* 08/10/2013 1121   PROT 6.0 07/22/2013 0421   ALBUMIN 3.4* 08/10/2013 1121   ALBUMIN 3.4* 07/22/2013 0421   AST 17 08/10/2013 1121   AST 13 07/22/2013 0421   ALT 23 08/10/2013 1121   ALT 14 07/22/2013 0421   ALKPHOS 56 08/10/2013 1121   ALKPHOS 62 07/22/2013 0421   BILITOT 1.98* 08/10/2013 1121   BILITOT 1.7* 07/22/2013 0421   GFRNONAA 74* 07/22/2013 0421   GFRAA 86* 07/22/2013 0421     Impression:  The patient is tolerating radiotherapy.   Plan: Discussed patient with Dr. Arbutus Ped. Patient unwilling to be admitted to hospital  Will therefore give 1L 0.45% NaCL IV.  Medical Oncology representative is going to come to patient room to discuss Cipro prophylaxis and neutropenic precautions.  F/u in rad/onc in 1 month.  Will notify Alaster Asfaw so she can schedule close f/u and /or labs for patient in medical oncology. -----------------------------------  Lonie Peak, MD

## 2013-08-10 NOTE — Addendum Note (Signed)
Encounter addended by: Eduardo Osier, RN on: 08/10/2013  4:35 PM<BR>     Documentation filed: Notes Section, Inpatient Document Flowsheet

## 2013-08-10 NOTE — Telephone Encounter (Signed)
Neutropenic precautions explained to patient.  Also explained that Dr Donnald Garre is calling in a rx for antibiotics.  Pt verbalized understanding.  SLJ

## 2013-08-10 NOTE — Progress Notes (Addendum)
0.45% Normal Saline infusion complete.  #22 IV removed from left ac intact.  Pressure and bandaid applied.  Cory [name] left walking out of the clinic with his neighbor.

## 2013-08-10 NOTE — Progress Notes (Signed)
OK to have radiation treatment with platelet count of 42 per Dr. Basilio Cairo.

## 2013-08-10 NOTE — Progress Notes (Signed)
Cory [name] has completed 35 fractions to his lung.  Note erythema and flat, closed blisters on his right neck, but no drainage noted.  Using Biafine on neck and given a new tube  since he completes today.  He denies any pain and denies any SOB.  His family reports that he only drinks only -6 oz of liquid daily.  He is orthostatic

## 2013-08-11 ENCOUNTER — Emergency Department (HOSPITAL_COMMUNITY): Payer: Medicare Other

## 2013-08-11 ENCOUNTER — Telehealth: Payer: Self-pay | Admitting: Pulmonary Disease

## 2013-08-11 ENCOUNTER — Encounter (HOSPITAL_COMMUNITY): Payer: Self-pay | Admitting: Emergency Medicine

## 2013-08-11 ENCOUNTER — Inpatient Hospital Stay (HOSPITAL_COMMUNITY)
Admission: EM | Admit: 2013-08-11 | Discharge: 2013-08-16 | DRG: 871 | Disposition: E | Payer: Medicare Other | Attending: Internal Medicine | Admitting: Internal Medicine

## 2013-08-11 ENCOUNTER — Other Ambulatory Visit: Payer: Self-pay

## 2013-08-11 DIAGNOSIS — J4489 Other specified chronic obstructive pulmonary disease: Secondary | ICD-10-CM | POA: Diagnosis present

## 2013-08-11 DIAGNOSIS — E870 Hyperosmolality and hypernatremia: Secondary | ICD-10-CM | POA: Diagnosis present

## 2013-08-11 DIAGNOSIS — D709 Neutropenia, unspecified: Secondary | ICD-10-CM

## 2013-08-11 DIAGNOSIS — N179 Acute kidney failure, unspecified: Secondary | ICD-10-CM | POA: Diagnosis present

## 2013-08-11 DIAGNOSIS — J189 Pneumonia, unspecified organism: Secondary | ICD-10-CM | POA: Diagnosis present

## 2013-08-11 DIAGNOSIS — G589 Mononeuropathy, unspecified: Secondary | ICD-10-CM | POA: Diagnosis present

## 2013-08-11 DIAGNOSIS — R5381 Other malaise: Secondary | ICD-10-CM | POA: Diagnosis present

## 2013-08-11 DIAGNOSIS — I872 Venous insufficiency (chronic) (peripheral): Secondary | ICD-10-CM | POA: Diagnosis present

## 2013-08-11 DIAGNOSIS — Z66 Do not resuscitate: Secondary | ICD-10-CM | POA: Diagnosis present

## 2013-08-11 DIAGNOSIS — J9600 Acute respiratory failure, unspecified whether with hypoxia or hypercapnia: Secondary | ICD-10-CM | POA: Diagnosis present

## 2013-08-11 DIAGNOSIS — A419 Sepsis, unspecified organism: Principal | ICD-10-CM | POA: Diagnosis present

## 2013-08-11 DIAGNOSIS — Z923 Personal history of irradiation: Secondary | ICD-10-CM

## 2013-08-11 DIAGNOSIS — Z9221 Personal history of antineoplastic chemotherapy: Secondary | ICD-10-CM

## 2013-08-11 DIAGNOSIS — J449 Chronic obstructive pulmonary disease, unspecified: Secondary | ICD-10-CM | POA: Diagnosis present

## 2013-08-11 DIAGNOSIS — C3490 Malignant neoplasm of unspecified part of unspecified bronchus or lung: Secondary | ICD-10-CM | POA: Diagnosis present

## 2013-08-11 DIAGNOSIS — R7309 Other abnormal glucose: Secondary | ICD-10-CM | POA: Diagnosis present

## 2013-08-11 DIAGNOSIS — F411 Generalized anxiety disorder: Secondary | ICD-10-CM | POA: Diagnosis present

## 2013-08-11 DIAGNOSIS — F17200 Nicotine dependence, unspecified, uncomplicated: Secondary | ICD-10-CM | POA: Diagnosis present

## 2013-08-11 DIAGNOSIS — D696 Thrombocytopenia, unspecified: Secondary | ICD-10-CM | POA: Diagnosis present

## 2013-08-11 DIAGNOSIS — Z515 Encounter for palliative care: Secondary | ICD-10-CM

## 2013-08-11 DIAGNOSIS — D61810 Antineoplastic chemotherapy induced pancytopenia: Secondary | ICD-10-CM | POA: Diagnosis present

## 2013-08-11 DIAGNOSIS — G4733 Obstructive sleep apnea (adult) (pediatric): Secondary | ICD-10-CM | POA: Diagnosis present

## 2013-08-11 DIAGNOSIS — E7800 Pure hypercholesterolemia, unspecified: Secondary | ICD-10-CM | POA: Diagnosis present

## 2013-08-11 DIAGNOSIS — B370 Candidal stomatitis: Secondary | ICD-10-CM | POA: Diagnosis present

## 2013-08-11 DIAGNOSIS — L309 Dermatitis, unspecified: Secondary | ICD-10-CM | POA: Diagnosis present

## 2013-08-11 DIAGNOSIS — E860 Dehydration: Secondary | ICD-10-CM

## 2013-08-11 DIAGNOSIS — Y842 Radiological procedure and radiotherapy as the cause of abnormal reaction of the patient, or of later complication, without mention of misadventure at the time of the procedure: Secondary | ICD-10-CM | POA: Diagnosis present

## 2013-08-11 DIAGNOSIS — Z681 Body mass index (BMI) 19 or less, adult: Secondary | ICD-10-CM

## 2013-08-11 DIAGNOSIS — E876 Hypokalemia: Secondary | ICD-10-CM | POA: Diagnosis present

## 2013-08-11 DIAGNOSIS — R627 Adult failure to thrive: Secondary | ICD-10-CM | POA: Diagnosis present

## 2013-08-11 DIAGNOSIS — L589 Radiodermatitis, unspecified: Secondary | ICD-10-CM | POA: Diagnosis present

## 2013-08-11 DIAGNOSIS — E43 Unspecified severe protein-calorie malnutrition: Secondary | ICD-10-CM | POA: Diagnosis present

## 2013-08-11 DIAGNOSIS — R5081 Fever presenting with conditions classified elsewhere: Secondary | ICD-10-CM | POA: Diagnosis present

## 2013-08-11 DIAGNOSIS — D703 Neutropenia due to infection: Secondary | ICD-10-CM | POA: Diagnosis present

## 2013-08-11 DIAGNOSIS — I10 Essential (primary) hypertension: Secondary | ICD-10-CM | POA: Diagnosis present

## 2013-08-11 DIAGNOSIS — Z79899 Other long term (current) drug therapy: Secondary | ICD-10-CM

## 2013-08-11 DIAGNOSIS — T451X5A Adverse effect of antineoplastic and immunosuppressive drugs, initial encounter: Secondary | ICD-10-CM | POA: Diagnosis present

## 2013-08-11 LAB — COMPREHENSIVE METABOLIC PANEL
ALT: 22 U/L (ref 0–53)
BUN: 66 mg/dL — ABNORMAL HIGH (ref 6–23)
Calcium: 9.1 mg/dL (ref 8.4–10.5)
GFR calc Af Amer: 37 mL/min — ABNORMAL LOW (ref >90)
Glucose, Bld: 128 mg/dL — ABNORMAL HIGH (ref 70–99)
Sodium: 148 mEq/L — ABNORMAL HIGH (ref 135–145)
Total Protein: 5.8 g/dL — ABNORMAL LOW (ref 6.0–8.3)

## 2013-08-11 LAB — BLOOD GAS, VENOUS
Acid-Base Excess: 3.2 mmol/L — ABNORMAL HIGH (ref 0.0–2.0)
Bicarbonate: 26.6 mEq/L — ABNORMAL HIGH (ref 20.0–24.0)
O2 Saturation: 76.6 %
pO2, Ven: 40.9 mmHg (ref 30.0–45.0)

## 2013-08-11 LAB — TYPE AND SCREEN: Antibody Screen: NEGATIVE

## 2013-08-11 LAB — CBC WITH DIFFERENTIAL/PLATELET
Basophils Absolute: 0 10*3/uL (ref 0.0–0.1)
Eosinophils Absolute: 0 10*3/uL (ref 0.0–0.7)
MCV: 93.8 fL (ref 78.0–100.0)
Monocytes Relative: 0 % — ABNORMAL LOW (ref 3–12)
Platelets: 19 10*3/uL — CL (ref 150–400)
RDW: 18 % — ABNORMAL HIGH (ref 11.5–15.5)
WBC: <0.1 10*3/uL — CL (ref 4.0–10.5)
nRBC: 0 /100 WBC

## 2013-08-11 LAB — TROPONIN I: Troponin I: <0.3 ng/mL (ref <0.30)

## 2013-08-11 MED ORDER — FLUCONAZOLE 200 MG PO TABS: 200.0000 mg | ORAL_TABLET | Freq: Once | ORAL | Status: DC

## 2013-08-11 MED ORDER — VANCOMYCIN HCL IN DEXTROSE 1-5 GM/200ML-% IV SOLN
1000.0000 mg | Freq: Once | INTRAVENOUS | Status: AC
  Administered 2013-08-12: 1000 mg via INTRAVENOUS
  Filled 2013-08-11: qty 200

## 2013-08-11 MED ORDER — NYSTATIN 100000 UNIT/ML MT SUSP
5.0000 mL | Freq: Once | OROMUCOSAL | Status: DC
  Filled 2013-08-11: qty 5

## 2013-08-11 MED ORDER — SODIUM CHLORIDE 0.9 % IV BOLUS (SEPSIS)
1000.0000 mL | Freq: Once | INTRAVENOUS | Status: AC
  Administered 2013-08-11: 1000 mL via INTRAVENOUS

## 2013-08-11 MED ORDER — METRONIDAZOLE IN NACL 5-0.79 MG/ML-% IV SOLN
500.0000 mg | Freq: Three times a day (TID) | INTRAVENOUS | Status: DC
  Administered 2013-08-11 – 2013-08-13 (×5): 500 mg via INTRAVENOUS
  Filled 2013-08-11 (×6): qty 100

## 2013-08-11 MED ORDER — DEXTROSE 5 % IV SOLN
1.0000 g | Freq: Two times a day (BID) | INTRAVENOUS | Status: DC
  Administered 2013-08-11: 1 g via INTRAVENOUS
  Filled 2013-08-11 (×2): qty 1

## 2013-08-11 MED ORDER — ACETAMINOPHEN 325 MG PO TABS
650.0000 mg | ORAL_TABLET | Freq: Once | ORAL | Status: AC
  Administered 2013-08-11: 650 mg via ORAL
  Filled 2013-08-11: qty 2

## 2013-08-11 MED ORDER — SODIUM CHLORIDE 0.9 % IV SOLN
Freq: Once | INTRAVENOUS | Status: AC
  Administered 2013-08-11: 23:00:00 via INTRAVENOUS

## 2013-08-11 NOTE — ED Notes (Signed)
Bed: ZO10 Expected date:  Expected time:  Means of arrival:  Comments: EMS 79yo M; wt loss, recent chemo

## 2013-08-11 NOTE — ED Notes (Signed)
Brought in by EMS from home with c/o weakness.  Per EMS, pt's family reported that he has been feeling tired "more than usual" after his recent chemo and radiation therapy last Monday for his Lung Cancer.

## 2013-08-11 NOTE — Telephone Encounter (Signed)
Called and spoke with pts wife---  SN reviewed labs and xrays.  SN cannot see anything to explain his behavior (nothing medical).  Perhaps its the cancer or the therapy  SN cannot think of a single thing to do differently--  i doubt that he would agree to psychiatric counseling. Perhaps he should consider stopping the therapy at this point (a honeymoon period) and see if he improves mentally.  Or could consider hospice.    Spoke with pts wife and she is aware of SN recs at this point.  She stated that he is done with the radiation now and had his last chemo tx last week.  He has ct scan next week and then will follow up with Dr. Shirline Frees ---i advised the pts wife that she should discuss with Dr. Shirline Frees too to see what further recs they may have.  Nothing further is needed.

## 2013-08-11 NOTE — Addendum Note (Signed)
Encounter addended by: Eduardo Osier, RN on: 08/02/2013 12:59 PM<BR>     Documentation filed: Notes Section

## 2013-08-11 NOTE — ED Notes (Signed)
Dr Rhunette Croft notified of critical wbc and platelets; no orders given

## 2013-08-11 NOTE — Telephone Encounter (Signed)
Called and spoke with pts wife----she was very upset about the events from today and not sure what to do.  She stated that she felt he needed to be seen.  i advised her to call Dr. Rebecca Eaton office and let them know of the events that have been going on and see if they feel the pt should be seen prior to his appt on 9/9.  Spouse will do this and nothing further is needed at this time.

## 2013-08-11 NOTE — ED Provider Notes (Signed)
CSN: [id number]     Arrival date & time 08/08/2013  2124 History   First MD Initiated Contact with Patient 07/27/2013 2130     Chief Complaint  Patient presents with  . Weakness    HPI   Cory [name] is a 79 year old male with a PMH of small cell lung cancer (Hx of chemo and radiation), COPD, OSA, HTN, VI, borderline DM, diverticulosis, BPH, DJD, and lumbar pain who presents to the ED for evaluation of weakness.  Patient is a poor historian.  Patient is oriented to self and time but not to place.  He complains of lightheadedness for 4 days.  Per family reports from previous phone calls, patient has not been eating or drinking adequately for about 1 week.  He also has had increased confusion per family.  Patient also complains of diffuse abdominal pain and states he has not had a bowel movement in the past 4 days.  No rectal pain/bleeding or bloody stools.  He states he is nauseated with no emesis.  He also has had rhinorrhea and sore throat for a few days.  He also has had a productive cough with white sputum.  He denies any SOB or chest pain.  No recent fever, chills, headache, numbness, tingling, focal weakness, loss of sensation, slurred speech, or leg edema.  He denies any falls or injuries.  He has redness to his right neck but patient is unable to elaborate about this.  He also has tenderness to the right forearm but denies any injuries.  Patient continues to smoke.  Wife states that he was put on cipro about a week ago, but only tool one pill and stopped taking it.       Past Medical History  Diagnosis Date  . OSA (obstructive sleep apnea)   . COPD (chronic obstructive pulmonary disease)   . Cigarette smoker   . Hypertension   . Venous insufficiency   . Hypercholesteremia   . Borderline diabetes mellitus   . Diverticulosis of colon   . Hx of colonic polyps   . BPH (benign prostatic hyperplasia)   . DJD (degenerative joint disease)   . Lumbar back pain   . Leg cramps   . History of  skin cancer   . History of shingles   . History of chemotherapy 05/25/2013    carboplatin, etoposide, neulasta every 3 weeks    Past Surgical History  Procedure Laterality Date  . Appendectomy    . Tonsillectomy and adenoidectomy    . Left inguinal hernia repair  1998    Dr. Earlene Plater  . Lumbar laminectomy  1997    Dr. Roxan Hockey  . Lymph node biopsy  05/06/2013   Family History  Problem Relation Age of Onset  . Cervical cancer Mother   . Breast cancer Sister   . Prostate cancer Father    History  Substance Use Topics  . Smoking status: Current Every Day Smoker -- 1.00 packs/day for 60 years    Types: Cigarettes  . Smokeless tobacco: Never Used     Comment: has cut back   . Alcohol Use: No     Comment: rare use    Review of Systems  Constitutional: Positive for activity change, appetite change and fatigue. Negative for fever, chills and diaphoresis.  HENT: Positive for sore throat, rhinorrhea and neck pain. Negative for ear pain, congestion, drooling, mouth sores, trouble swallowing, neck stiffness and sinus pressure.   Eyes: Negative for photophobia and visual disturbance.  Respiratory: Positive for cough. Negative for shortness of breath and wheezing.   Cardiovascular: Negative for chest pain, palpitations and leg swelling.  Gastrointestinal: Positive for nausea, abdominal pain and constipation. Negative for vomiting, diarrhea, blood in stool, abdominal distention and anal bleeding.  Genitourinary: Negative for dysuria, hematuria and difficulty urinating.  Musculoskeletal: Negative for back pain.  Skin: Positive for wound.  Neurological: Positive for weakness (generalized) and light-headedness. Negative for dizziness, syncope, numbness and headaches.    Allergies  Niacin  Home Medications   Current Outpatient Rx  Name  Route  Sig  Dispense  Refill  . acetaminophen (TYLENOL) 500 MG tablet   Oral   Take 500 mg by mouth every 6 (six) hours as needed for pain.          Marland Kitchen Alum & Mag Hydroxide-Simeth (MAGIC MOUTHWASH W/LIDOCAINE) SOLN   Oral   Take 5 mLs by mouth 4 (four) times daily as needed.   250 mL   1   . amLODipine (NORVASC) 5 MG tablet      TAKE ONE TABLET BY MOUTH ONE TIME DAILY   90 tablet   0     Pt needs ov with SN for further refills.   Marland Kitchen antiseptic oral rinse (BIOTENE) LIQD   Mouth Rinse   15 mLs by Mouth Rinse route as needed.         Marland Kitchen aspirin 81 MG tablet   Oral   Take 81 mg by mouth daily.           . Cetirizine HCl (ZYRTEC ALLERGY) 10 MG CAPS   Oral   Take 1 capsule by mouth.         . Cholecalciferol (VITAMIN D) 2000 UNITS CAPS   Oral   Take 1 capsule by mouth daily.          . ciprofloxacin (CIPRO) 500 MG tablet   Oral   Take 1 tablet (500 mg total) by mouth 2 (two) times daily. X 5 days   10 tablet   0   . emollient (BIAFINE) cream   Topical   Apply topically 2 (two) times daily.         . fentaNYL (DURAGESIC - DOSED MCG/HR) 25 MCG/HR patch   Transdermal   Place 1 patch (25 mcg total) onto the skin every 3 (three) days.   10 patch   0   . fluconazole (DIFLUCAN) 100 MG tablet      Take 2 tabs today, then 1 tab daily for 20 more days for thrush   22 tablet   0   . guaiFENesin (MUCINEX) 600 MG 12 hr tablet   Oral   Take 1,200 mg by mouth daily.         . hyaluronate sodium (RADIAPLEXRX) GEL   Topical   Apply topically 2 (two) times daily.         Marland Kitchen HYDROcodone-acetaminophen (NORCO/VICODIN) 5-325 MG per tablet   Oral   Take 1 tablet by mouth every 6 (six) hours as needed for pain.   100 tablet   5   . latanoprost (XALATAN) 0.005 % ophthalmic solution   Left Eye   Place 1 drop into the left eye at bedtime.           . Multiple Vitamin (MULTIVITAMIN) capsule   Oral   Take 1 capsule by mouth daily.           Marland Kitchen EXPIRED: potassium chloride SA (KLOR-CON M20) 20 MEQ tablet  Oral   Take 1 tablet (20 mEq total) by mouth 2 (two) times daily.   180 tablet   3     Pt needs ov  with Dr. Kriste Basque for further refills.   . prochlorperazine (COMPAZINE) 10 MG tablet   Oral   Take 1 tablet (10 mg total) by mouth every 6 (six) hours as needed.   60 tablet   0   . simvastatin (ZOCOR) 40 MG tablet   Oral   Take 40 mg by mouth at bedtime.          . sucralfate (CARAFATE) 1 GM/10ML suspension   Oral   Take 1 g by mouth 4 (four) times daily.         Terrance Mass Salicylate (MYOFLEX EX)   Apply externally   Apply 1 application topically 2 (two) times daily as needed. For pain  AS NEEDED          There were no vitals taken for this visit.   Filed Vitals:   09-05-2013 2135 2013-09-05 2227  BP: 107/72 110/72  Pulse: 135 126  Temp: 101.5 F (38.6 C) 101.1 F (38.4 C)  TempSrc: Oral Oral  Resp: 18   SpO2: 91% 99%    Physical Exam  Nursing note and vitals reviewed. Constitutional: He is oriented to person, place, and time. He appears well-developed and well-nourished. No distress.  Thin chronically ill appearing male  HENT:  Head: Normocephalic and atraumatic.  Right Ear: External ear normal.  Left Ear: External ear normal.  Nose: Nose normal.  Mouth/Throat: Oropharynx is clear and moist. No oropharyngeal exudate.  TM's gray and translucent bilaterally.  Dry mucus membranes. Thick white film on the tongue throughout  Eyes: Conjunctivae and EOM are normal. Pupils are equal, round, and reactive to light. Right eye exhibits no discharge. Left eye exhibits no discharge.  Neck: Normal range of motion. Neck supple.  Erythema to the right lateral neck and clavicle.  Scabs present on the lateral neck with no open lacerations. Skin is warm to the touch   Cardiovascular: Regular rhythm, normal heart sounds and intact distal pulses.  Exam reveals no gallop and no friction rub.   No murmur heard. Tachycardic.  Dorsalis and radial pulses present bilaterally  Pulmonary/Chest: Effort normal and breath sounds normal. No respiratory distress. He has no wheezes. He has no  rales. He exhibits no tenderness.  Abdominal: Soft. Bowel sounds are normal. He exhibits no distension and no mass. There is no tenderness. There is no rebound and no guarding.  Musculoskeletal: Normal range of motion. He exhibits tenderness. He exhibits no edema.  Tenderness to the right forearm and wrist throughout with no focal tenderness.  No right elbow tenderness.  Overlying erythema to the right anterior forearm  Neurological: He is alert and oriented to person, place, and time.  GCS 15.  No focal neurological deficits.  CN 2-12 intact. Gross sensation intact in the upper and lower extremities bilaterally.    Skin: Skin is warm and dry. He is not diaphoretic. There is erythema. There is pallor.    ED Course  Procedures (including critical care time) Labs Review Labs Reviewed - No data to display Imaging Review No results found.  Results for orders placed during the hospital encounter of 09-05-13  CBC WITH DIFFERENTIAL      Result Value Range   WBC <0.1 (*) 4.0 - 10.5 K/uL   RBC 2.90 (*) 4.22 - 5.81 MIL/uL   Hemoglobin 9.0 (*) 13.0 -  17.0 g/dL   HCT 91.4 (*) 78.2 - 95.6 %   MCV 93.8  78.0 - 100.0 fL   MCH 31.0  26.0 - 34.0 pg   MCHC 33.1  30.0 - 36.0 g/dL   RDW 21.3 (*) 08.6 - 57.8 %   Platelets 19 (*) 150 - 400 K/uL   Neutrophils Relative % 0 (*) 43 - 77 %   Lymphocytes Relative 0 (*) 12 - 46 %   Monocytes Relative 0 (*) 3 - 12 %   Eosinophils Relative 0  0 - 5 %   Basophils Relative 0  0 - 1 %   Band Neutrophils 0  0 - 10 %   nRBC 0  0 /100 WBC   Lymphs Abs 0 (*) 0.7 - 4.0 K/uL   Monocytes Absolute 0 (*) 0.1 - 1.0 K/uL   Eosinophils Absolute 0  0.0 - 0.7 K/uL   Basophils Absolute 0  0.0 - 0.1 K/uL   RBC Morphology ELLIPTOCYTES     WBC Morphology TOO FEW TO COUNT, SMEAR AVAILABLE FOR REVIEW    COMPREHENSIVE METABOLIC PANEL      Result Value Range   Sodium 148 (*) 135 - 145 mEq/L   Potassium 3.4 (*) 3.5 - 5.1 mEq/L   Chloride 106  96 - 112 mEq/L   CO2 29  19 - 32  mEq/L   Glucose, Bld 128 (*) 70 - 99 mg/dL   BUN 66 (*) 6 - 23 mg/dL   Creatinine, Ser 4.69 (*) 0.50 - 1.35 mg/dL   Calcium 9.1  8.4 - 62.9 mg/dL   Total Protein 5.8 (*) 6.0 - 8.3 g/dL   Albumin 3.0 (*) 3.5 - 5.2 g/dL   AST 14  0 - 37 U/L   ALT 22  0 - 53 U/L   Alkaline Phosphatase 45  39 - 117 U/L   Total Bilirubin 2.0 (*) 0.3 - 1.2 mg/dL   GFR calc non Af Amer 32 (*) >90 mL/min   GFR calc Af Amer 37 (*) >90 mL/min  TROPONIN I      Result Value Range   Troponin I <0.30  <0.30 ng/mL  LACTIC ACID, PLASMA      Result Value Range   Lactic Acid, Venous 2.2  0.5 - 2.2 mmol/L  BLOOD GAS, VENOUS      Result Value Range   pH, Ven 7.466 (*) 7.250 - 7.300   pCO2, Ven 37.4 (*) 45.0 - 50.0 mmHg   pO2, Ven 40.9  30.0 - 45.0 mmHg   Bicarbonate 26.6 (*) 20.0 - 24.0 mEq/L   TCO2 24.6  0 - 100 mmol/L   Acid-Base Excess 3.2 (*) 0.0 - 2.0 mmol/L   O2 Saturation 76.6     Patient temperature 98.6     Collection site VEIN     Drawn by COLLECTED BY NURSE     Sample type VEIN    TYPE AND SCREEN      Result Value Range   ABO/RH(D) B POS     Antibody Screen NEG     Sample Expiration 08/14/2013       Date: 07/18/2013  Rate: 132  Rhythm: sinus tachycardia  QRS Axis: normal  Intervals: normal  ST/T Wave abnormalities: normal  Conduction Disutrbances:none  Narrative Interpretation:   Old EKG Reviewed: 07/19/13 - sinus rhythm    MDM   1. Sepsis   2. Neutropenic fever   3. Thrombocytopenia   4. Dehydration      Aram Candela  Mikaelian  is a 79 year old male with a PMH of small cell lung cancer (Hx of chemo and radiation), COPD, OSA, HTN, VI, borderline DM, diverticulosis, BPH, DJD, and lumbar pain who presents to the ED for evaluation of weakness.  X-rays of the right wrist and forearm ordered due to pain.  Abdominal x-rays due to diffuse abdominal pain.  Chest x-ray, EKG, and troponin ordered to evaluate for cardiopulmonary causes of weakness and fatigue. Tylenol ordered for fever.  IV fluids ordered  for dehydration. CBC, CMP, UA, venous blood gas, blood cultures and lactate ordered to evaluate fever and weakness.      Recheck 11:30 PM = Neutropenic precautions started.  Patient started on Vanco, Flagyl, and cefepime as well as nystatin oral solution for possible oral thrush.  Type and screen ordered.   12:06 AM = Patient has to use the restroom.  No other concerns.  Spoke with wife who is now present in the ED.    Consults  12:08 AM = Spoke with Dr. Allena Katz who agrees that he can be step-down at this time.   Patient will be admitted for further evaluation of his sepsis, generalized weakness and fatigue, dehydration, hypernatremia, and pancytopenia.  Patient was afebrile, tachycardic, and had leukopenia in the ED.  Etiology of fever is unclear at this time.  Chest x-ray negative for pneumonia.  UA negative for UTI.  Dermatitis from radiation vs. Cellulitis is possible.  He remained in no acute distress throughout his ED visit.  He also is likely dehydrated from decreased intake.  He was given IV fluids in the ED.  Blood cultures were drawn and patient was started on IV antibiotics including vanco, flagyl, and cefepime.  He was given a nystatin oral swish for possible oral thrush.  Patient was in agreement with admission and plan.      Final impressions: 1. Sepsis with neutropenic fever  2. Dermatitis  3. Thrombocytopenia  4. Generalized weakness and fatigue  5. Leukocytopenia  6. Anemia  7. Dehydration  8. Hypernatremia    Greer Ee Nanci Lakatos PA-C     Jillyn Ledger, PA-C 08/12/13 1131

## 2013-08-11 NOTE — ED Notes (Addendum)
Pt placed on NEUTROPENIC PRECAUTIONS.

## 2013-08-11 NOTE — Telephone Encounter (Signed)
I spoke with spouse. She stated pt is not able to eat/drink anything. She came home and pt was in the bed asleep but all the doors where open and including the garage door. She stated he is not remembering to close things when he opens them. She stated she is really worried bc something ca=ould have happened to him d/t him leaving all the doors open. Please advise SN thanks  last OV 08/04/13 Pending 09/10/13

## 2013-08-12 ENCOUNTER — Telehealth: Payer: Self-pay | Admitting: Pulmonary Disease

## 2013-08-12 ENCOUNTER — Encounter (HOSPITAL_COMMUNITY): Payer: Self-pay

## 2013-08-12 DIAGNOSIS — A419 Sepsis, unspecified organism: Principal | ICD-10-CM

## 2013-08-12 DIAGNOSIS — D696 Thrombocytopenia, unspecified: Secondary | ICD-10-CM | POA: Diagnosis present

## 2013-08-12 DIAGNOSIS — D709 Neutropenia, unspecified: Secondary | ICD-10-CM

## 2013-08-12 DIAGNOSIS — E860 Dehydration: Secondary | ICD-10-CM

## 2013-08-12 DIAGNOSIS — J449 Chronic obstructive pulmonary disease, unspecified: Secondary | ICD-10-CM

## 2013-08-12 DIAGNOSIS — L259 Unspecified contact dermatitis, unspecified cause: Secondary | ICD-10-CM

## 2013-08-12 DIAGNOSIS — L309 Dermatitis, unspecified: Secondary | ICD-10-CM | POA: Diagnosis present

## 2013-08-12 DIAGNOSIS — N179 Acute kidney failure, unspecified: Secondary | ICD-10-CM | POA: Diagnosis present

## 2013-08-12 DIAGNOSIS — B370 Candidal stomatitis: Secondary | ICD-10-CM

## 2013-08-12 DIAGNOSIS — C3490 Malignant neoplasm of unspecified part of unspecified bronchus or lung: Secondary | ICD-10-CM

## 2013-08-12 LAB — GLUCOSE, CAPILLARY: Glucose-Capillary: 99 mg/dL (ref 70–99)

## 2013-08-12 LAB — LEGIONELLA ANTIGEN, URINE: Legionella Antigen, Urine: NEGATIVE

## 2013-08-12 LAB — CBC
HCT: 24.4 % — ABNORMAL LOW (ref 39.0–52.0)
HCT: 22.1 % — ABNORMAL LOW (ref 39.0–52.0)
Platelets: 12 10*3/uL — CL (ref 150–400)
Platelets: 10 10*3/uL — CL (ref 150–400)
RBC: 2.6 MIL/uL — ABNORMAL LOW (ref 4.22–5.81)
RDW: 18 % — ABNORMAL HIGH (ref 11.5–15.5)
RDW: 18.2 % — ABNORMAL HIGH (ref 11.5–15.5)
WBC: <0.1 10*3/uL — CL (ref 4.0–10.5)
WBC: <0.1 10*3/uL — CL (ref 4.0–10.5)

## 2013-08-12 LAB — URINALYSIS, ROUTINE W REFLEX MICROSCOPIC
Leukocytes, UA: NEGATIVE
Nitrite: NEGATIVE
Specific Gravity, Urine: 1.023 (ref 1.005–1.030)
pH: 5 (ref 5.0–8.0)

## 2013-08-12 LAB — PROTIME-INR: INR: 1.71 — ABNORMAL HIGH (ref 0.00–1.49)

## 2013-08-12 LAB — BASIC METABOLIC PANEL
Calcium: 8.2 mg/dL — ABNORMAL LOW (ref 8.4–10.5)
Chloride: 114 mEq/L — ABNORMAL HIGH (ref 96–112)
Creatinine, Ser: 1.43 mg/dL — ABNORMAL HIGH (ref 0.50–1.35)
GFR calc non Af Amer: 44 mL/min — ABNORMAL LOW (ref >90)
Potassium: 3 mEq/L — ABNORMAL LOW (ref 3.5–5.1)

## 2013-08-12 MED ORDER — TROLAMINE SALICYLATE 10 % EX CREA: TOPICAL_CREAM | CUTANEOUS | Status: DC | PRN

## 2013-08-12 MED ORDER — ONDANSETRON HCL 4 MG PO TABS: 4.0000 mg | ORAL_TABLET | Freq: Four times a day (QID) | ORAL | Status: DC | PRN

## 2013-08-12 MED ORDER — ACETAMINOPHEN 10 MG/ML IV SOLN
1000.0000 mg | Freq: Once | INTRAVENOUS | Status: AC
  Administered 2013-08-12: 1000 mg via INTRAVENOUS
  Filled 2013-08-12: qty 100

## 2013-08-12 MED ORDER — GUAIFENESIN ER 600 MG PO TB12
1200.0000 mg | ORAL_TABLET | Freq: Every day | ORAL | Status: DC
  Filled 2013-08-12 (×2): qty 2

## 2013-08-12 MED ORDER — FILGRASTIM 300 MCG/ML IJ SOLN
300.0000 ug | Freq: Every day | INTRAMUSCULAR | Status: DC
  Administered 2013-08-12: 300 ug via SUBCUTANEOUS
  Filled 2013-08-12 (×2): qty 1

## 2013-08-12 MED ORDER — ACETAMINOPHEN 650 MG RE SUPP: 650.0000 mg | Freq: Four times a day (QID) | RECTAL | Status: DC | PRN

## 2013-08-12 MED ORDER — GUAIFENESIN-DM 100-10 MG/5ML PO SYRP: 5.0000 mL | ORAL_SOLUTION | ORAL | Status: DC | PRN

## 2013-08-12 MED ORDER — PRO-STAT SUGAR FREE PO LIQD
30.0000 mL | Freq: Two times a day (BID) | ORAL | Status: DC
  Filled 2013-08-12 (×4): qty 30

## 2013-08-12 MED ORDER — HYDROCODONE-ACETAMINOPHEN 5-325 MG PO TABS: 1.0000 | ORAL_TABLET | Freq: Four times a day (QID) | ORAL | Status: DC | PRN

## 2013-08-12 MED ORDER — SODIUM CHLORIDE 0.9 % IJ SOLN
3.0000 mL | Freq: Two times a day (BID) | INTRAMUSCULAR | Status: DC
  Administered 2013-08-13: 3 mL via INTRAVENOUS

## 2013-08-12 MED ORDER — ACETAMINOPHEN 325 MG PO TABS
650.0000 mg | ORAL_TABLET | Freq: Four times a day (QID) | ORAL | Status: DC | PRN
  Filled 2013-08-12: qty 2

## 2013-08-12 MED ORDER — PANTOPRAZOLE SODIUM 40 MG PO TBEC
40.0000 mg | DELAYED_RELEASE_TABLET | Freq: Every day | ORAL | Status: DC
  Filled 2013-08-12: qty 1

## 2013-08-12 MED ORDER — BIOTENE DRY MOUTH MT LIQD
15.0000 mL | OROMUCOSAL | Status: DC | PRN
Start: 2013-08-12 — End: 2013-08-15

## 2013-08-12 MED ORDER — FENTANYL 25 MCG/HR TD PT72: 25.0000 ug | MEDICATED_PATCH | TRANSDERMAL | Status: DC

## 2013-08-12 MED ORDER — POTASSIUM CHLORIDE 10 MEQ/50ML IV SOLN: 10.0000 meq | INTRAVENOUS | Status: DC

## 2013-08-12 MED ORDER — SODIUM CHLORIDE 0.9 % IV SOLN: INTRAVENOUS | Status: DC

## 2013-08-12 MED ORDER — ADULT MULTIVITAMIN W/MINERALS CH
1.0000 | ORAL_TABLET | Freq: Every day | ORAL | Status: DC
  Filled 2013-08-12 (×2): qty 1

## 2013-08-12 MED ORDER — MAGIC MOUTHWASH W/LIDOCAINE
5.0000 mL | Freq: Four times a day (QID) | ORAL | Status: DC | PRN
  Filled 2013-08-12: qty 5

## 2013-08-12 MED ORDER — DEXTROSE 5 % IV SOLN
2.0000 g | INTRAVENOUS | Status: DC
  Filled 2013-08-12: qty 2

## 2013-08-12 MED ORDER — BIOTENE DRY MOUTH MT LIQD
15.0000 mL | Freq: Two times a day (BID) | OROMUCOSAL | Status: DC
  Administered 2013-08-12: 15 mL via OROMUCOSAL

## 2013-08-12 MED ORDER — CHLORHEXIDINE GLUCONATE 0.12 % MT SOLN
15.0000 mL | Freq: Two times a day (BID) | OROMUCOSAL | Status: DC
  Administered 2013-08-12 (×2): 15 mL via OROMUCOSAL
  Filled 2013-08-12 (×4): qty 15

## 2013-08-12 MED ORDER — LEVALBUTEROL HCL 1.25 MG/0.5ML IN NEBU
1.2500 mg | INHALATION_SOLUTION | Freq: Four times a day (QID) | RESPIRATORY_TRACT | Status: DC
  Administered 2013-08-12 – 2013-08-14 (×7): 1.25 mg via RESPIRATORY_TRACT
  Filled 2013-08-12 (×18): qty 0.5

## 2013-08-12 MED ORDER — ALBUTEROL SULFATE (5 MG/ML) 0.5% IN NEBU: 2.5000 mg | INHALATION_SOLUTION | RESPIRATORY_TRACT | Status: DC | PRN

## 2013-08-12 MED ORDER — LEVALBUTEROL HCL 1.25 MG/0.5ML IN NEBU
1.2500 mg | INHALATION_SOLUTION | Freq: Once | RESPIRATORY_TRACT | Status: AC
  Administered 2013-08-12: 1.25 mg via RESPIRATORY_TRACT
  Filled 2013-08-12: qty 0.5

## 2013-08-12 MED ORDER — ALBUTEROL SULFATE HFA 108 (90 BASE) MCG/ACT IN AERS
2.0000 | INHALATION_SPRAY | Freq: Four times a day (QID) | RESPIRATORY_TRACT | Status: DC | PRN
  Filled 2013-08-12: qty 6.7

## 2013-08-12 MED ORDER — PRUTECT EX EMUL
1.0000 "application " | Freq: Two times a day (BID) | CUTANEOUS | Status: DC
  Administered 2013-08-12 – 2013-08-14 (×5): 1 via TOPICAL
  Filled 2013-08-12: qty 45

## 2013-08-12 MED ORDER — SUCRALFATE 1 GM/10ML PO SUSP
1.0000 g | Freq: Four times a day (QID) | ORAL | Status: DC
  Filled 2013-08-12 (×8): qty 10

## 2013-08-12 MED ORDER — SODIUM CHLORIDE 0.9 % IJ SOLN: 3.0000 mL | INTRAMUSCULAR | Status: DC | PRN

## 2013-08-12 MED ORDER — DEXTROSE 5 % IV SOLN
2.0000 g | Freq: Two times a day (BID) | INTRAVENOUS | Status: DC
  Administered 2013-08-12 (×2): 2 g via INTRAVENOUS
  Filled 2013-08-12 (×3): qty 2

## 2013-08-12 MED ORDER — FLUCONAZOLE 100MG IVPB
100.0000 mg | INTRAVENOUS | Status: DC
  Administered 2013-08-12 – 2013-08-13 (×2): 100 mg via INTRAVENOUS
  Filled 2013-08-12 (×3): qty 50

## 2013-08-12 MED ORDER — ONDANSETRON HCL 4 MG/2ML IJ SOLN: 4.0000 mg | Freq: Four times a day (QID) | INTRAMUSCULAR | Status: DC | PRN

## 2013-08-12 MED ORDER — IPRATROPIUM BROMIDE 0.02 % IN SOLN: 0.5000 mg | RESPIRATORY_TRACT | Status: DC | PRN

## 2013-08-12 MED ORDER — SODIUM CHLORIDE 0.9 % IV SOLN: 250.0000 mL | INTRAVENOUS | Status: DC | PRN

## 2013-08-12 MED ORDER — LATANOPROST 0.005 % OP SOLN
1.0000 [drp] | Freq: Every day | OPHTHALMIC | Status: DC
  Administered 2013-08-12 – 2013-08-14 (×2): 1 [drp] via OPHTHALMIC
  Filled 2013-08-12: qty 2.5

## 2013-08-12 MED ORDER — FLUCONAZOLE 100MG IVPB
50.0000 mg | INTRAVENOUS | Status: DC
  Filled 2013-08-12: qty 25

## 2013-08-12 MED ORDER — NICOTINE 14 MG/24HR TD PT24
14.0000 mg | MEDICATED_PATCH | Freq: Every day | TRANSDERMAL | Status: DC
  Administered 2013-08-12 – 2013-08-14 (×3): 14 mg via TRANSDERMAL
  Filled 2013-08-12 (×4): qty 1

## 2013-08-12 MED ORDER — POTASSIUM CHLORIDE 10 MEQ/100ML IV SOLN
10.0000 meq | INTRAVENOUS | Status: AC
  Administered 2013-08-12 (×3): 10 meq via INTRAVENOUS
  Filled 2013-08-12: qty 300

## 2013-08-12 MED ORDER — SODIUM CHLORIDE 0.45 % IV SOLN
INTRAVENOUS | Status: DC
  Administered 2013-08-12: 10:00:00 via INTRAVENOUS

## 2013-08-12 MED ORDER — VANCOMYCIN HCL 500 MG IV SOLR
500.0000 mg | Freq: Two times a day (BID) | INTRAVENOUS | Status: DC
  Administered 2013-08-12 – 2013-08-13 (×2): 500 mg via INTRAVENOUS
  Filled 2013-08-12 (×3): qty 500

## 2013-08-12 NOTE — ED Notes (Signed)
ROOM [id number] ASSIGNED@0021 

## 2013-08-12 NOTE — Progress Notes (Signed)
INITIAL NUTRITION ASSESSMENT  DOCUMENTATION CODES Per approved criteria  -Severe malnutrition in the context of chronic illness  Pt meets criteria for SEVERE MALNUTRITION in the context of Chronic illness as evidenced by 20% wt loss in less than 2 months and estimated energy intake <75% of estimated energy requirements for >1 month.  INTERVENTION: Recommend SLP evalutation Diet advancement per MD discretion Provide Multivitamin with minerals daily Provide Pro-stat BID Once diet is advanced: Provide Ensure Complete BID Provide Magic Cup and Ensure Pudding once daily   NUTRITION DIAGNOSIS: Inadequate oral intake related to medical condition as evidenced by 20% wt loss in less than 2 months.   Goal: Pt to meet >/= 90% of their estimated nutrition needs   Monitor:  Diet advancement Po intake Weight Labs  Reason for Assessment: Malnutrition Screening Tool, score of 5  79 y.o. male  Admitting Dx: Neutropenic sepsis  ASSESSMENT: 79 y.o. male with Past medical history of small cell lung cancer who is active smoker, undergoing radiation therapy recently completed, COPD. As per the patient he started having shortness of breath and cough today without any chest pain, palpitation, dizziness, lightheadedness.  As per the wife since last few days he has been acting confused and forgetful. Today he started having sudden worsen off shortness of breath with raspy cough and fever which brought him in. He developed gradually worsening poor intake, and generalized weakness. Pt asleep at time of visit. Discussed pt with pt's son who reports pt was not eating well PTA and has lost a lot of weight. Son states that pt is very strong-willed and has not been following most medical and nutritional advice; pt has refused nutritional supplements. Son also reports that pt was having a lot of trouble swallowing but, this has improved recently. Per weight history in chart pt weighed 160 lbs from May until July  and then rapidly lost weight from the beginning of July until now. Pt has had 20% wt loss in less than 2 months and 6% wt loss within the past month.  Pt's son spoke with RN and requested geriatric Psych consult (pt has left AMA in the past and son is concerned he will try again).    Height: Ht Readings from Last 1 Encounters:  08/12/13 5\' 9"  (1.753 m)    Weight: Wt Readings from Last 1 Encounters:  08/12/13 128 lb (58.06 kg)    Ideal Body Weight: 160 lbs  % Ideal Body Weight: 80%  Wt Readings from Last 10 Encounters:  08/12/13 128 lb (58.06 kg)  08/10/13 124 lb 12.8 oz (56.609 kg)  08/04/13 137 lb 3.2 oz (62.234 kg)  08/03/13 135 lb 4.8 oz (61.372 kg)  08/02/13 133 lb 12.8 oz (60.691 kg)  07/28/13 138 lb 11.2 oz (62.914 kg)  07/27/13 139 lb 12.8 oz (63.413 kg)  07/22/13 135 lb 11.2 oz (61.553 kg)  07/19/13 136 lb 7.4 oz (61.9 kg)  07/19/13 136 lb 8 oz (61.916 kg)  Pt weighed 160 lbs the beginning of July 2014  Usual Body Weight: 160 lbs (July 2014)  % Usual Body Weight: 80%  BMI:  Body mass index is 18.89 kg/(m^2).  Estimated Nutritional Needs: Kcal: 1750-1920 Protein: 90-100 grams Fluid: 2 L  Skin: radiation therapy burn on right neck  Diet Order: NPO  EDUCATION NEEDS: -Education not appropriate at this time   Intake/Output Summary (Last 24 hours) at 08/12/13 1305 Last data filed at 08/12/13 1212  Gross per 24 hour  Intake 2366.67 ml  Output  800 ml  Net 1566.67 ml    Last BM: PTA   Labs:   Recent Labs Lab 08/10/13 1121 07/21/2013 2225 08/12/13 0320  NA 151* 148* 149*  K 3.6 3.4* 3.0*  CL  --  106 114*  CO2 27 29 26   BUN 59.9* 66* 58*  CREATININE 1.4* 1.86* 1.43*  CALCIUM 9.1 9.1 8.2*  GLUCOSE 152* 128* 132*    CBG (last 3)   Recent Labs  08/12/13 0857  GLUCAP 99    Scheduled Meds: . antiseptic oral rinse  15 mL Mouth Rinse q12n4p  . ceFEPime (MAXIPIME) IV  2 g Intravenous Q12H  . chlorhexidine  15 mL Mouth Rinse BID  .  [START ON 08/14/2013] fentaNYL  25 mcg Transdermal Q72H  . fluconazole (DIFLUCAN) IV  100 mg Intravenous Q24H  . guaiFENesin  1,200 mg Oral Daily  . latanoprost  1 drop Left Eye QHS  . levalbuterol  1.25 mg Nebulization Q6H  . metronidazole  500 mg Intravenous Q8H  . nicotine  14 mg Transdermal Daily  . nystatin  5 mL Oral Once  . PRUTECT  1 application Topical BID  . sodium chloride  3 mL Intravenous Q12H  . sodium chloride  3 mL Intravenous Q12H  . sucralfate  1 g Oral QID  . vancomycin  500 mg Intravenous Q12H    Continuous Infusions: . sodium chloride 100 mL/hr at 08/12/13 1010    Past Medical History  Diagnosis Date  . OSA (obstructive sleep apnea)   . COPD (chronic obstructive pulmonary disease)   . Cigarette smoker   . Hypertension   . Venous insufficiency   . Hypercholesteremia   . Borderline diabetes mellitus   . Diverticulosis of colon   . Hx of colonic polyps   . BPH (benign prostatic hyperplasia)   . DJD (degenerative joint disease)   . Lumbar back pain   . Leg cramps   . History of skin cancer   . History of shingles   . History of chemotherapy 05/25/2013    carboplatin, etoposide, neulasta every 3 weeks     Past Surgical History  Procedure Laterality Date  . Appendectomy    . Tonsillectomy and adenoidectomy    . Left inguinal hernia repair  1998    Dr. Earlene Plater  . Lumbar laminectomy  1997    Dr. Roxan Hockey  . Lymph node biopsy  05/06/2013    Ian Malkin RD, LDN Inpatient Clinical Dietitian Pager: [phone] After Hours Pager: [phone]

## 2013-08-12 NOTE — Progress Notes (Addendum)
Pt seen and examined, admitted this am per Dr.Patel 84/M with COPD, Limited stage Lung CA on concurrent chemo/XRT Admitted with Sepsis, Febrile neutropenia, cough, congestion and hypoxia Continue Empiric IV abx-Vanc/Cefepime and FLuconazole, wean O2 Received Leulasta last Friday, 8/22 will give dose of Neupogen now Severe thrombocytopenia, transfuse if <10K or active bleeding, cbc this afternoon [facility], Dr.Mohamed out today, will try again tomorrow Called and d/w wife, Cory [name], Cell: [phone]  Zannie Cove, MD [phone]

## 2013-08-12 NOTE — Progress Notes (Signed)
ANTIBIOTIC CONSULT NOTE - INITIAL  Pharmacy Consult for Diflucan/Vancomycin/Cefepime Indication: Thrush/Neutropenic Sepsis  Allergies  Allergen Reactions  . Niacin     REACTION: Intol to Niacin w/ flushing in large doses    Patient Measurements: Height: 5\' 9"  (175.3 cm) Weight: 128 lb (58.06 kg) IBW/kg (Calculated) : 70.7   Vital Signs: Temp: 99.4 F (37.4 C) (08/28 0115) Temp src: Oral (08/28 0115) BP: 136/62 mmHg (08/28 0248) Pulse Rate: 115 (08/28 0248) Intake/Output from previous day: 08/27 0701 - 08/28 0700 In: 1000 [I.V.:1000] Out: -  Intake/Output from this shift: Total I/O In: 1000 [I.V.:1000] Out: -   Labs:  Recent Labs  08/10/13 1121 08/10/13 1121 09/12/2013 2225  WBC 0.5*  --  <0.1*  HGB 9.5*  --  9.0*  PLT 42*  --  19*  CREATININE  --  1.4* 1.86*   Estimated Creatinine Clearance: 25.2 ml/min (by C-G formula based on Cr of 1.86). No results found for this basename: VANCOTROUGH, VANCOPEAK, VANCORANDOM, GENTTROUGH, GENTPEAK, GENTRANDOM, TOBRATROUGH, TOBRAPEAK, TOBRARND, AMIKACINPEAK, AMIKACINTROU, AMIKACIN,  in the last 72 hours   Microbiology: No results found for this or any previous visit (from the past 720 hour(s)).  Medical History: Past Medical History  Diagnosis Date  . OSA (obstructive sleep apnea)   . COPD (chronic obstructive pulmonary disease)   . Cigarette smoker   . Hypertension   . Venous insufficiency   . Hypercholesteremia   . Borderline diabetes mellitus   . Diverticulosis of colon   . Hx of colonic polyps   . BPH (benign prostatic hyperplasia)   . DJD (degenerative joint disease)   . Lumbar back pain   . Leg cramps   . History of skin cancer   . History of shingles   . History of chemotherapy 05/25/2013    carboplatin, etoposide, neulasta every 3 weeks     Medications:  Scheduled:  . ceFEPime (MAXIPIME) IV  2 g Intravenous Q24H  . [START ON 08/14/2013] fentaNYL  25 mcg Transdermal Q72H  . fluconazole (DIFLUCAN) IV  50  mg Intravenous Q24H  . guaiFENesin  1,200 mg Oral Daily  . latanoprost  1 drop Left Eye QHS  . levalbuterol  1.25 mg Nebulization Once  . metronidazole  500 mg Intravenous Q8H  . nystatin  5 mL Oral Once  . PRUTECT  1 application Topical BID  . sodium chloride  3 mL Intravenous Q12H  . sodium chloride  3 mL Intravenous Q12H  . sucralfate  1 g Oral QID   Infusions:  . sodium chloride     Assessment: 79 yo male with hx of small cell lung Ca  c/o SOB with cough .    Goal of Therapy:  Vancomycin trough level 15-20 mcg/ml  Plan:   Diflucan 50mg  IV q24h  Cefepime 2Gm IV q12h.  Vancomycin 750mg  IV q24h.  CrCl~31 (N)  F/U Scr/levels/cultures as needed  Lorenza Evangelist 08/12/2013,3:13 AM

## 2013-08-12 NOTE — Telephone Encounter (Signed)
Called and spoke with pts wife and she is aware that SN knows the pt is in the hospital now.  She wanted to call and make sure that SN was aware.  Nothing further is needed.

## 2013-08-12 NOTE — Progress Notes (Signed)
[id number]/Cory Davis, RN, BSN, CCM [phone] Chart Reviewed for discharge and hospital needs. Discharge needs at time of review:  None Review of patient progress due on 08/07/2013. 

## 2013-08-12 NOTE — H&P (Signed)
[facility] History and Physical  Patient: Cory [name]  VHQ:[id number]  DOB: 1929-06-20  DOA: 07/16/2013  Referring physician: Dr. Rhunette Croft PCP: Michele Mcalpine, MD   Chief Complaint: Shortness of breath with cough  HPI: Cory [name] is a 79 y.o. male with Past medical history of small cell lung cancer who is active smoker, undergoing radiation therapy recently completed, COPD. The history has been provided by both patient and his wife. As per the patient he started having shortness of breath and cough today without any chest pain, palpitation, dizziness, lightheadedness. As per the wife since last few days he has been acting confused and forgetful. Today he started having sudden worsen off shortness of breath with raspy cough and fever which brought him in. He developed gradually worsening poor intake, and generalized weakness. Patient denies any nausea or aspiration. He mention he had some runny nose since last few days, with white sputum and cough. Patient denies any fall trauma or injury.  He does complain of pain in his right elbow which he mentions has been ongoing since last few days. As per the wife the patient has been not taking any medication since last 4-5 days.  Review of Systems: as mentioned in the history of present illness.  A Comprehensive review of the other systems is negative.  Past Medical History  Diagnosis Date  . OSA (obstructive sleep apnea)   . COPD (chronic obstructive pulmonary disease)   . Cigarette smoker   . Hypertension   . Venous insufficiency   . Hypercholesteremia   . Borderline diabetes mellitus   . Diverticulosis of colon   . Hx of colonic polyps   . BPH (benign prostatic hyperplasia)   . DJD (degenerative joint disease)   . Lumbar back pain   . Leg cramps   . History of skin cancer   . History of shingles   . History of chemotherapy 05/25/2013    carboplatin, etoposide, neulasta every 3 weeks    Past Surgical History  Procedure  Laterality Date  . Appendectomy    . Tonsillectomy and adenoidectomy    . Left inguinal hernia repair  1998    Dr. Earlene Plater  . Lumbar laminectomy  1997    Dr. Roxan Hockey  . Lymph node biopsy  05/06/2013   Social History:  reports that he has been smoking Cigarettes.  He has a 60 pack-year smoking history. He has never used smokeless tobacco. He reports that he does not drink alcohol or use illicit drugs. Patient is coming from home. ndependent for most of his  ADL.  Allergies  Allergen Reactions  . Niacin     REACTION: Intol to Niacin w/ flushing in large doses    Family History  Problem Relation Age of Onset  . Cervical cancer Mother   . Breast cancer Sister   . Prostate cancer Father     Prior to Admission medications   Medication Sig Start Date End Date Taking? Authorizing Provider  acetaminophen (TYLENOL) 500 MG tablet Take 500 mg by mouth every 6 (six) hours as needed for pain.   Yes Historical Provider, MD  Alum & Mag Hydroxide-Simeth (MAGIC MOUTHWASH W/LIDOCAINE) SOLN Take 5 mLs by mouth 4 (four) times daily as needed. 07/09/13  Yes Jonna Coup, MD  amLODipine (NORVASC) 5 MG tablet TAKE ONE TABLET BY MOUTH ONE TIME DAILY 04/09/13  Yes Michele Mcalpine, MD  antiseptic oral rinse (BIOTENE) LIQD 15 mLs by Mouth Rinse route as needed.   Yes  Historical Provider, MD  aspirin EC 81 MG tablet Take 81 mg by mouth daily.   Yes Historical Provider, MD  Cetirizine HCl (ZYRTEC ALLERGY) 10 MG CAPS Take 1 capsule by mouth.   Yes Historical Provider, MD  Cholecalciferol (VITAMIN D) 2000 UNITS CAPS Take 1 capsule by mouth daily.    Yes Historical Provider, MD  ciprofloxacin (CIPRO) 500 MG tablet Take 1 tablet (500 mg total) by mouth 2 (two) times daily. X 5 days 08/10/13  Yes Si Gaul, MD  emollient (BIAFINE) cream Apply topically 2 (two) times daily.   Yes Historical Provider, MD  fentaNYL (DURAGESIC - DOSED MCG/HR) 25 MCG/HR patch Place 1 patch (25 mcg total) onto the skin every 3  (three) days. 08/04/13  Yes Michele Mcalpine, MD  fluconazole (DIFLUCAN) 100 MG tablet Take 100 mg by mouth daily. For 20 days only.   Yes Historical Provider, MD  guaiFENesin (MUCINEX) 600 MG 12 hr tablet Take 1,200 mg by mouth daily.   Yes Historical Provider, MD  hyaluronate sodium (RADIAPLEXRX) GEL Apply topically 2 (two) times daily.   Yes Historical Provider, MD  latanoprost (XALATAN) 0.005 % ophthalmic solution Place 1 drop into the left eye at bedtime.     Yes Historical Provider, MD  Multiple Vitamin (MULTIVITAMIN) capsule Take 1 capsule by mouth daily.     Yes Historical Provider, MD  potassium chloride SA (K-DUR,KLOR-CON) 20 MEQ tablet Take 20 mEq by mouth 2 (two) times daily.   Yes Historical Provider, MD  prochlorperazine (COMPAZINE) 10 MG tablet Take 1 tablet (10 mg total) by mouth every 6 (six) hours as needed. 05/25/13  Yes Si Gaul, MD  simvastatin (ZOCOR) 40 MG tablet Take 40 mg by mouth at bedtime.  06/23/13  Yes Historical Provider, MD  sucralfate (CARAFATE) 1 GM/10ML suspension Take 1 g by mouth 4 (four) times daily.   Yes Historical Provider, MD  Trolamine Salicylate (MYOFLEX EX) Apply 1 application topically 2 (two) times daily as needed. For pain  AS NEEDED   Yes Historical Provider, MD  HYDROcodone-acetaminophen (NORCO/VICODIN) 5-325 MG per tablet Take 1 tablet by mouth every 6 (six) hours as needed for pain. 08/04/13   Michele Mcalpine, MD    Physical Exam: Filed Vitals:   08/12/2013 2135 07/23/2013 2227  BP: 107/72 110/72  Pulse: 135 126  Temp: 101.5 F (38.6 C) 101.1 F (38.4 C)  TempSrc: Oral Oral  Resp: 18   SpO2: 91% 99%    General: Alert, Awake and Oriented to Time, Place and Person. Appear in moderate distress Eyes: PERRL ENT: Oral Mucosa, with thrush and dry. Neck: Diffuse redness on the right side of the neck which appears superficial, no JVD, no Carotid Bruits, no swelling or fluid palpated. Cardiovascular: S1 and S2 Present, no Murmur, Peripheral Pulses  Present Respiratory: Bilateral Air entry equal and Decreased, diffuse bilateral Crackles, no wheezes Abdomen: Bowel Sound Present, Soft and Non tender  Skin: Diffuse spotty Rash, which patient and wife mentions her seen since radiation treatment Extremities: No Pedal edema, no calf tenderness, pain in the right elbow without any swelling or redness Neurologic: Grossly Unremarkable.  Labs on Admission:  CBC:  Recent Labs Lab 08/10/13 1121 07/19/2013 2225  WBC 0.5* <0.1*  NEUTROABS 0.5*  --   HGB 9.5* 9.0*  HCT 29.1* 27.2*  MCV 95.1 93.8  PLT 42* 19*    CMP     Component Value Date/Time   NA 148* 08/09/2013 2225   NA 151* 08/10/2013 1121  K 3.4* 08/14/2013 2225   K 3.6 08/10/2013 1121   CL 106 08/10/2013 2225   CL 104 06/08/2013 1426   CO2 29 07/17/2013 2225   CO2 27 08/10/2013 1121   GLUCOSE 128* 08/04/2013 2225   GLUCOSE 152* 08/10/2013 1121   GLUCOSE 97 06/08/2013 1426   GLUCOSE 137* 12/15/2006 1022   BUN 66* 08/07/2013 2225   BUN 59.9* 08/10/2013 1121   CREATININE 1.86* 08/12/2013 2225   CREATININE 1.4* 08/10/2013 1121   CALCIUM 9.1 07/26/2013 2225   CALCIUM 9.1 08/10/2013 1121   PROT 5.8* 07/19/2013 2225   PROT 6.1* 08/10/2013 1121   ALBUMIN 3.0* 07/19/2013 2225   ALBUMIN 3.4* 08/10/2013 1121   AST 14 07/20/2013 2225   AST 17 08/10/2013 1121   ALT 22 07/17/2013 2225   ALT 23 08/10/2013 1121   ALKPHOS 45 07/17/2013 2225   ALKPHOS 56 08/10/2013 1121   BILITOT 2.0* 08/13/2013 2225   BILITOT 1.98* 08/10/2013 1121   GFRNONAA 32* 08/06/2013 2225   GFRAA 37* 08/07/2013 2225    No results found for this basename: LIPASE, AMYLASE,  in the last 168 hours No results found for this basename: AMMONIA,  in the last 168 hours  Cardiac Enzymes:  Recent Labs Lab 07/27/2013 2225  TROPONINI <0.30    BNP (last 3 results) No results found for this basename: PROBNP,  in the last 8760 hours  Radiological Exams on Admission: Dg Chest 2 View  08/10/2013   *RADIOLOGY REPORT*  Clinical Data:  Weakness  CHEST - 2 VIEW  Comparison: 08/04/2013  Findings: The heart and pulmonary vascularity are within normal limits.  The lungs are clear bilaterally.  No acute bony abnormality is seen.  IMPRESSION: No acute abnormality is noted.   Original Report Authenticated By: Alcide Clever, M.D.   Dg Forearm Right  08/10/2013   *RADIOLOGY REPORT*  Clinical Data: Right forearm pain  RIGHT FOREARM - 2 VIEW  Comparison: None.  Findings: No acute fracture or dislocation is noted.  No gross soft tissue abnormality is seen.  IMPRESSION: No acute abnormality noted.   Original Report Authenticated By: Alcide Clever, M.D.   Dg Wrist Complete Right  07/23/2013   *RADIOLOGY REPORT*  Clinical Data: Right wrist pain  RIGHT WRIST - COMPLETE 3+ VIEW  Comparison: None.  Findings: No acute fracture or dislocation is noted.  No soft tissue abnormality is seen.  IMPRESSION: No acute abnormality noted.   Original Report Authenticated By: Alcide Clever, M.D.   Dg Abd 2 Views  07/27/2013   *RADIOLOGY REPORT*  Clinical Data: Abdominal pain  ABDOMEN - 2 VIEW  Comparison: 07/02/2013  Findings: Scattered large and small bowel gas is noted.  No obstructive changes are seen.  No free air is noted.  No mass lesion or abnormal calcifications are noted.  Degenerative change of the lumbar spine is noted.  IMPRESSION: No acute abnormality noted.   Original Report Authenticated By: Alcide Clever, M.D.    EKG: Independently reviewed. sinus tachycardia.  Assessment/Plan Principal Problem:   Neutropenic sepsis Active Problems:   COPD   Lung cancer   Thrombocytopenia   Oral thrush   AKI (acute kidney injury)   Dermatitis   1. Neutropenic sepsis The patient has severe neutropenia and from a thrombo-cytopenia based on the labs. He also has acute kidney injury with mild worsening of liver function. Hemoglobin appears stable at present. His shortness of breath with diffuse crackles progressively worsening cough, fever and neutropenia  suggest a respiratory etiology for  his sepsis. As the patient does not appear to have any leg swelling or arm swelling possibility of DVTs less likely, and his symptoms can be explained by pneumonia. Currently IV fluids have been initiated, oxygen will be provided as needed, broad-spectrum antibiotics with IV Vancomycin IV cefepime and IV Cipro will be provided. He does have history of COPD but does not appear to be wheezing. Therefore I would will be provided as needed but no scheduled nebulization or steroids. Blood cultures urine cultures and sputum cultures have been obtained. Urine antigens will be requested. Patient will be monitored in the step down unit. Medical oncology will be consulted in the morning. As he does not have any hematological malignancy antiviral treatment would not be provided. IV fluconazole will be given for his oral thrush.  2. Dermatitis Likely due to radiation, patient has been receiving topical therapy from radiation oncology. We will continue the same. Skin care consult in the morning.  3. Acute kidney injury Likely secondary to prerenal etiology with poor oral intake and sepsis. Possibility of ATN due to sepsis cannot be ruled out. IV hydration will be provided and serum BMP will be followed.  4. Thrombocytopenia At present the patient does not appear to have any neurological deficit, there is no excessive bleeding. Continue to monitor platelet counts. Would require transfusion if the actively bleeding or platelet count less than 10. Hematology oncology consult in the morning. Avoid anticoagulation and aspirin.  DVT Prophylaxis: mechanical compression device Nutrition: Regular diet  Code Status: DO NOT RESUSCITATE and DO NOT INTUBATE, discussed with patient and wife  Family Communication: Wife was present explained about the plan.   Author: Lynden Oxford, MD [facility] Pager: [phone] 08/12/2013, 12:59 AM    If 7PM-7AM, please contact  night-coverage [email or web address] Password [email or web address]

## 2013-08-13 ENCOUNTER — Telehealth: Payer: Self-pay | Admitting: Pulmonary Disease

## 2013-08-13 ENCOUNTER — Inpatient Hospital Stay (HOSPITAL_COMMUNITY): Payer: Medicare Other

## 2013-08-13 DIAGNOSIS — N179 Acute kidney failure, unspecified: Secondary | ICD-10-CM

## 2013-08-13 DIAGNOSIS — E43 Unspecified severe protein-calorie malnutrition: Secondary | ICD-10-CM | POA: Insufficient documentation

## 2013-08-13 DIAGNOSIS — Z515 Encounter for palliative care: Secondary | ICD-10-CM

## 2013-08-13 DIAGNOSIS — C3490 Malignant neoplasm of unspecified part of unspecified bronchus or lung: Secondary | ICD-10-CM

## 2013-08-13 DIAGNOSIS — C3410 Malignant neoplasm of upper lobe, unspecified bronchus or lung: Secondary | ICD-10-CM

## 2013-08-13 DIAGNOSIS — F17200 Nicotine dependence, unspecified, uncomplicated: Secondary | ICD-10-CM

## 2013-08-13 LAB — URINE CULTURE: Colony Count: NO GROWTH

## 2013-08-13 MED ORDER — DIAZEPAM 5 MG/ML IJ SOLN
2.5000 mg | Freq: Once | INTRAMUSCULAR | Status: AC
  Administered 2013-08-13: 2.5 mg via INTRAVENOUS
  Filled 2013-08-13: qty 2

## 2013-08-13 MED ORDER — IPRATROPIUM BROMIDE 0.02 % IN SOLN
0.5000 mg | RESPIRATORY_TRACT | Status: DC | PRN
  Filled 2013-08-13: qty 2.5

## 2013-08-13 MED ORDER — MAGIC MOUTHWASH
5.0000 mL | Freq: Four times a day (QID) | ORAL | Status: DC
  Administered 2013-08-13 – 2013-08-14 (×5): 5 mL via ORAL
  Filled 2013-08-13 (×12): qty 5

## 2013-08-13 MED ORDER — LEVOFLOXACIN IN D5W 500 MG/100ML IV SOLN
500.0000 mg | INTRAVENOUS | Status: DC
  Administered 2013-08-13: 500 mg via INTRAVENOUS
  Filled 2013-08-13: qty 100

## 2013-08-13 MED ORDER — SODIUM CHLORIDE 0.9 % IV SOLN
0.2500 mg/h | INTRAVENOUS | Status: DC
  Administered 2013-08-13 – 2013-08-14 (×2): 0.25 mg/h via INTRAVENOUS
  Filled 2013-08-13 (×2): qty 2.5

## 2013-08-13 MED ORDER — IPRATROPIUM BROMIDE 0.02 % IN SOLN
0.5000 mg | Freq: Four times a day (QID) | RESPIRATORY_TRACT | Status: DC
Start: 2013-08-13 — End: 2013-08-15
  Administered 2013-08-13 – 2013-08-14 (×5): 0.5 mg via RESPIRATORY_TRACT
  Filled 2013-08-13 (×5): qty 2.5

## 2013-08-13 MED ORDER — DIAZEPAM 5 MG/ML IJ SOLN
2.5000 mg | INTRAMUSCULAR | Status: DC | PRN
  Administered 2013-08-13 – 2013-08-14 (×2): 2.5 mg via INTRAVENOUS
  Filled 2013-08-13: qty 2

## 2013-08-13 MED ORDER — DIAZEPAM 5 MG/ML IJ SOLN
2.5000 mg | Freq: Two times a day (BID) | INTRAMUSCULAR | Status: DC
  Filled 2013-08-13: qty 2

## 2013-08-13 MED ORDER — HYDROMORPHONE HCL PF 1 MG/ML IJ SOLN
1.0000 mg | INTRAMUSCULAR | Status: DC | PRN
  Administered 2013-08-13: 1 mg via INTRAVENOUS
  Filled 2013-08-13: qty 1

## 2013-08-13 MED ORDER — MAGIC MOUTHWASH W/LIDOCAINE
5.0000 mL | ORAL | Status: DC | PRN
  Filled 2013-08-13: qty 5

## 2013-08-13 MED ORDER — IPRATROPIUM BROMIDE 0.02 % IN SOLN
RESPIRATORY_TRACT | Status: AC
  Filled 2013-08-13: qty 2.5

## 2013-08-13 MED ORDER — HYDROMORPHONE HCL PF 1 MG/ML IJ SOLN
0.5000 mg | Freq: Once | INTRAMUSCULAR | Status: AC
  Administered 2013-08-13: 0.5 mg via INTRAVENOUS
  Filled 2013-08-13: qty 1

## 2013-08-13 MED ORDER — RADIAPLEXRX EX GEL
Freq: Three times a day (TID) | CUTANEOUS | Status: DC
  Administered 2013-08-14 (×2): via TOPICAL
  Filled 2013-08-13: qty 170

## 2013-08-13 NOTE — Progress Notes (Addendum)
Pt became tachycardic with HR in 150s, pt declined EKG,was asymptomatic. declined 2200 Sucralfate and Pro-Stat. E-Link MD notified, no new order received. Pt currently in bed resting, denied pain, HR in 120s.

## 2013-08-13 NOTE — Progress Notes (Signed)
Comment:  Notified by RN that pt now refusing all treatment. She reports he refused his HS meds then at approx 0400 pt removed his 02 and refused to allow RN to replace. Pt is also tachycardic but refusing all medications. I went to bedside to speak with pt. Pt noted lying quietly in bed in NAD. He is alert and oriented x 3 and is able to explain in detail his diagnosis and why he is in the hospital. Current VS, T-98.9, BP-96/49, P-132, R-20 w/ 02 sats of 81-85% on R/A. He denies any pain or discomfort and confirms he does not want anything done to him at this point. Pt is DNR and appears quite competent to make his own decisions.  RN has notified daughter. I have also requested family be notified again if his condition begins to rapidly deteriorate. Will continue to monitor closely and provide support as indicated and permitted by pt.   Leanne Chang, NP-C [facility] Pager [phone]

## 2013-08-13 NOTE — Progress Notes (Addendum)
[facility] PROGRESS NOTE  Cory [name] NFA:[id number] DOB: February 14, 1929 DOA: 08/12/2013 PCP: Michele Mcalpine, MD  Assessment/Plan: 1. Sepsis/Febrile Neutropenia -on Broad spectrum antibiotics, VAnc/Cefepime and FLuconazole -FU cultures -repeat CXR today, suspect respiratory source, early pneumonia vs broncitis -neupogen SQ -pt refuses O2 or any treatment, but family not in agreement  2. Severe Thrombocytopenia -from chemo -no evidence of bleeding yet -Pt does not want anything done -transfuse plts if pt allows  3. Locally Advanced/Limited stage Small Cell Lung CA -s/p recent Chemo/XRT -pt declines anything further, family not in agreement and want to d/w Oncology -Dr.Mohamed out of office this week, Requested dr.Ennever on call to eval -pt and family want Palliative consult  4. COPD -Nebs  dvt proph: SCDs  Code Status: DNR Family Communication: d/w son and wife at bedside Disposition Plan: home    Consultants:  Oncology  Palliative care pending  Antibiotics:  Vanc  Cefepime  Fluconazole  HPI/Subjective: Declines any further treatment, clearly and categorically told me that  Objective: Filed Vitals:   08/13/13 0405  BP:   Pulse: 132  Temp:   Resp: 20    Intake/Output Summary (Last 24 hours) at 08/13/13 0811 Last data filed at 08/13/13 0700  Gross per 24 hour  Intake 2891.67 ml  Output    825 ml  Net 2066.67 ml   Filed Weights   08/12/13 0115  Weight: 58.06 kg (128 lb)    Exam:   General: alert, awake, oriented to self, place and time  HEENT: R neck with radiation dermatitis  Cardiovascular: S1S2/RRR  Respiratory: scattered ronchi  Abdomen: soft, Nt, BS present  Musculoskeletal:no edema c/c  Data Reviewed: Basic Metabolic Panel:  Recent Labs Lab 08/10/13 1121 07/22/2013 2225 08/12/13 0320  NA 151* 148* 149*  K 3.6 3.4* 3.0*  CL  --  106 114*  CO2 27 29 26   GLUCOSE 152* 128* 132*  BUN 59.9* 66* 58*  CREATININE 1.4*  1.86* 1.43*  CALCIUM 9.1 9.1 8.2*   Liver Function Tests:  Recent Labs Lab 08/10/13 1121 07/27/2013 2225  AST 17 14  ALT 23 22  ALKPHOS 56 45  BILITOT 1.98* 2.0*  PROT 6.1* 5.8*  ALBUMIN 3.4* 3.0*   No results found for this basename: LIPASE, AMYLASE,  in the last 168 hours No results found for this basename: AMMONIA,  in the last 168 hours CBC:  Recent Labs Lab 08/10/13 1121 07/22/2013 2225 08/12/13 0320 08/12/13 1558  WBC 0.5* <0.1* <0.1* <0.1*  NEUTROABS 0.5*  --   --   --   HGB 9.5* 9.0* 8.2* 7.3*  HCT 29.1* 27.2* 24.4* 22.1*  MCV 95.1 93.8 93.8 93.6  PLT 42* 19* 12* 10*   Cardiac Enzymes:  Recent Labs Lab 07/21/2013 2225  TROPONINI <0.30   BNP (last 3 results) No results found for this basename: PROBNP,  in the last 8760 hours CBG:  Recent Labs Lab 08/12/13 0857  GLUCAP 99    Recent Results (from the past 240 hour(s))  CULTURE, BLOOD (ROUTINE X 2)     Status: None   Collection Time    07/28/2013 10:25 PM      Result Value Range Status   Specimen Description BLOOD LEFT ARM   Final   Special Requests BOTTLES DRAWN AEROBIC AND ANAEROBIC   Final   Culture  Setup Time     Final   Value: 08/12/2013 01:55     Performed at [facility]  Final   Value:        BLOOD CULTURE RECEIVED NO GROWTH TO DATE CULTURE WILL BE HELD FOR 5 DAYS BEFORE ISSUING A FINAL NEGATIVE REPORT     Performed at [facility]   Report Status PENDING   Incomplete  CULTURE, BLOOD (ROUTINE X 2)     Status: None   Collection Time    07/24/2013 10:50 PM      Result Value Range Status   Specimen Description BLOOD RIGHT ANTECUBITAL   Final   Special Requests BOTTLES DRAWN AEROBIC AND ANAEROBIC   Final   Culture  Setup Time     Final   Value: 08/12/2013 03:30     Performed at [facility]   Culture     Final   Value:        BLOOD CULTURE RECEIVED NO GROWTH TO DATE CULTURE WILL BE HELD FOR 5 DAYS BEFORE ISSUING A FINAL NEGATIVE REPORT     Performed  at [facility]   Report Status PENDING   Incomplete  URINE CULTURE     Status: None   Collection Time    08/12/13  3:13 AM      Result Value Range Status   Specimen Description URINE, CATHETERIZED   Final   Special Requests Normal   Final   Culture  Setup Time     Final   Value: 08/12/2013 10:04     Performed at [facility] Count     Final   Value: NO GROWTH     Performed at [facility]   Culture     Final   Value: NO GROWTH     Performed at [facility]   Report Status 08/13/2013 FINAL   Final  MRSA PCR SCREENING     Status: None   Collection Time    08/12/13  3:37 AM      Result Value Range Status   MRSA by PCR NEGATIVE  NEGATIVE Final   Comment:            The GeneXpert MRSA Assay (FDA     approved for NASAL specimens     only), is one component of a     comprehensive MRSA colonization     surveillance program. It is not     intended to diagnose MRSA     infection nor to guide or     monitor treatment for     MRSA infections.     Studies: Dg Chest 2 View  08/10/2013   *RADIOLOGY REPORT*  Clinical Data: Weakness  CHEST - 2 VIEW  Comparison: 08/04/2013  Findings: The heart and pulmonary vascularity are within normal limits.  The lungs are clear bilaterally.  No acute bony abnormality is seen.  IMPRESSION: No acute abnormality is noted.   Original Report Authenticated By: Alcide Clever, M.D.   Dg Forearm Right  07/16/2013   *RADIOLOGY REPORT*  Clinical Data: Right forearm pain  RIGHT FOREARM - 2 VIEW  Comparison: None.  Findings: No acute fracture or dislocation is noted.  No gross soft tissue abnormality is seen.  IMPRESSION: No acute abnormality noted.   Original Report Authenticated By: Alcide Clever, M.D.   Dg Wrist Complete Right  08/02/2013   *RADIOLOGY REPORT*  Clinical Data: Right wrist pain  RIGHT WRIST - COMPLETE 3+ VIEW  Comparison: None.  Findings: No acute fracture or dislocation is noted.  No soft tissue abnormality is  seen.  IMPRESSION: No acute abnormality noted.   Original Report Authenticated By: Alcide Clever, M.D.   Dg Abd 2 Views  07/21/2013   *RADIOLOGY REPORT*  Clinical Data: Abdominal pain  ABDOMEN - 2 VIEW  Comparison: 07/02/2013  Findings: Scattered large and small bowel gas is noted.  No obstructive changes are seen.  No free air is noted.  No mass lesion or abnormal calcifications are noted.  Degenerative change of the lumbar spine is noted.  IMPRESSION: No acute abnormality noted.   Original Report Authenticated By: Alcide Clever, M.D.    Scheduled Meds: . antiseptic oral rinse  15 mL Mouth Rinse q12n4p  . ceFEPime (MAXIPIME) IV  2 g Intravenous Q12H  . chlorhexidine  15 mL Mouth Rinse BID  . feeding supplement  30 mL Oral BID AC & HS  . [START ON 08/14/2013] fentaNYL  25 mcg Transdermal Q72H  . filgrastim (NEUPOGEN)  SQ  300 mcg Subcutaneous q1800  . fluconazole (DIFLUCAN) IV  100 mg Intravenous Q24H  . guaiFENesin  1,200 mg Oral Daily  . latanoprost  1 drop Left Eye QHS  . levalbuterol  1.25 mg Nebulization Q6H  . metronidazole  500 mg Intravenous Q8H  . multivitamin with minerals  1 tablet Oral Daily  . nicotine  14 mg Transdermal Daily  . nystatin  5 mL Oral Once  . pantoprazole  40 mg Oral Daily  . PRUTECT  1 application Topical BID  . sodium chloride  3 mL Intravenous Q12H  . sodium chloride  3 mL Intravenous Q12H  . sucralfate  1 g Oral QID  . vancomycin  500 mg Intravenous Q12H   Continuous Infusions: . sodium chloride 100 mL/hr at 08/12/13 1010    Principal Problem:   Neutropenic sepsis Active Problems:   COPD   Lung cancer   Thrombocytopenia   Oral thrush   AKI (acute kidney injury)   Dermatitis    Time spent:    Taelar Gronewold  [facility] Pager [phone] If 7PM-7AM, please contact night-coverage at [email or web address], password [facility] 08/13/2013, 8:11 AM  LOS: 2 days

## 2013-08-13 NOTE — Progress Notes (Signed)
Received referral for [facility] Care Management services. Spoke with patient at bedside. However, Dr Phillips Odor with Palliative Medicine Team reports the plan is for patient to go to [facility]. Therefore, [facility] Care Management services will not be appropriate at this time.  Raiford Noble, MSN-Ed, RN,BSN- [facility] [phone]

## 2013-08-13 NOTE — Progress Notes (Signed)
Pt removed his oxygen source, refused to put it back on, pt also refused morning labs, currently oxygen saturation is in the low 80s. Pt is alert, oriented to self, place and date.[facility] floor coverage made aware of pt refusal of care.  Pt's daughter Hanley Ben also notified on the number on file [phone]. Will continue to monitor.

## 2013-08-13 NOTE — Plan of Care (Signed)
Problem: Phase II Progression Outcomes Goal: Pain controlled on oral analgesia Outcome: Not Progressing Patient is refusing medications.

## 2013-08-13 NOTE — Progress Notes (Signed)
Met with patient's family and a close friend of theirs who is also a therapist. Evidently, the patient has been "difficult" for them to handle at times. One member described him as having "control" issues. They commented on how he wants to be alone right now (meaning not having them visit), but is enjoying staff attention. They talked about him going to [facility]. This is their desire. Listening; presence. Any concerns they have seem to be about this quality of life right now and misgivings they have about having done the cancer treatments. Presence; listening; support.

## 2013-08-13 NOTE — Progress Notes (Signed)
Cory [name] has taken a dramatic decline this afternoon. He is currently unresponsive with periods of apnea. I do not think at this point he will survive to achieve a discharge plan. Per son he has really not been comfortable until about 1PM after he received a dose of dilaudid and valium. Family report being completely at peace in honoring their fathers wishes to pursue comfort and peace after his short but very intense battle with his cancer. I discussed his current condituion with his son Cory [name] who is at the bedside-family have been notified and are en route from [place] and surrounding areas. Cory [name] has been taken home to rest as she is not in good health. Cory [name] is worried about post-mortem details and decisions and the stress that his mom may be under. He has to take his son to college in [place] this weekend and just trying to figure how to be present for his father and mother during this difficulty time.   Low dose Dilaudid Infusion  Change to nasal cannula O2  Will follow. Prognosis hours.  [name], DO Palliative Medicine'

## 2013-08-13 NOTE — Consult Note (Addendum)
Palliative Medicine Team Consult Note  Cory [name] is an 79 yo with small cell lung cancer who on 8/26 completed 35 fractions of radiation for lung cancer and completed a full cycle of systemic chemotherapy and was admitted the next day with confusion, agitation, uncontrolled pain and respiratory failure with a fever to 101, profound neutropenia, renal failure. Last PM he started refusing all interventions stating that he wanted no additional treatment and only wanted pain relief. PMT consulted. I met with Cory [name], his wife and son to discuss his current condition and his goals of care. Cory [name] has made a decision that he simply cannot do anymore-he is tired, he in in excruciating pain, and is so weak he cannot care for his basic needs. His wife is not well and has many health problems and he is so deconditioned that he feels he could never recover. Son is at bedside and is coping with his dad's decision to pursue comfort care since he just completed his treatment two days ago.."he rang the bell 8 times and then just collapsed". Everyone is just dealing with the "whys" of the treatment he went through and feeling somewhat midsled and defeated about what aggressive intervention could offer him. Cory [name] is adamant that he wants me to take action regarding his pain at this moment. We talked about possibilities and he would like to transition to residential hospice for pain control and full comfort care.  Active Problem List:  1. Small Cell Lung Cancer 2. Adverse SE Radiation Chemotherapy: Neutropenia, Anemia, Thrombocytopenia 3. Acute Renal Failure 4. Failure to Thrive 5. Generalized Weakness and Debility 6. Hypoxia and Respiratory Failure, PNA 7. Hypokalemia 8. Agitation/Anxiety 9. Dyspnea 10. Neuropathic Pain-right side 11. 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 Right arm edema/telangiectasias 13. Alopecia 14. Tachycardia 150's 15. Hypernatremia  1. Respiratory failure:  O2 sats are in low 80's, he intermittently tolerates face mask. Will give hydrmorphone for pain and dyspnea.  2. Neuropathic Pain-located in his right side- possible radiation injury to brachial plexus, has chronic sciatica pain.  3. Radiation Wound/Ulcer right upper neck-very painful for patient- will use a topical hydrogel-will need careful wound care.  4. Anxiety/Agitation: Will schedule BID IV valium and prn doses for breakthrough anxiety.  5. Immobility: Related to pain and edema-edema >in right arm  6. FTT: minimal PO intake  7. Xerostomia/Thrush: Magic Mouth wash PRN    Dispo: Recommended Residential Hospice, family specifically interested in [facility].

## 2013-08-13 NOTE — Progress Notes (Signed)
Clinical Social Work Department BRIEF PSYCHOSOCIAL ASSESSMENT 08/13/2013  Patient:  Cory [name], Cory [name]     Account Number:  [id number]     Admit date:  07/30/2013  Clinical Social Worker:  Jacelyn Grip  Date/Time:  08/13/2013 03:00 PM  Referred by:  Physician  Date Referred:  08/13/2013 Referred for  Residential hospice placement   Other Referral:   Interview type:  Family Other interview type:    PSYCHOSOCIAL DATA Living Status:  WIFE Admitted from facility:   Level of care:   Primary support name:  [facility]/wife & Laban Orourke [phone] Primary support relationship to patient:  FAMILY Degree of support available:   strong    CURRENT CONCERNS Current Concerns  Post-Acute Placement   Other Concerns:    SOCIAL WORK ASSESSMENT / PLAN CSW met with pt, pt wife, and pt son at bedside to discuss residential hospice placement.    CSW discussed process of residential hospice placement. CSW provided list of residential hospice facilities. Pt family expressed interest in [facility] and CSW discussed that CSW has been notified that [facility] does not have bed availability during the weekend. Pt family expressed that they would like for pt to transition as soon as possible and are agreeable to a referral being made to [facility].    CSW provided support and clarified questions.    CSW made referral to [facility], [facility]ine Him and contacted [facility] and faxed residential hospice referral.    CSW will continue to follow and assist with disposition planning when pt medically stable for discharge and residential hospice bed available.   Assessment/plan status:  Psychosocial Support/Ongoing Assessment of Needs Other assessment/ plan:   discharge planning   Information/referral to community resources:   Referral to [facility] and [facility]    PATIENT'S/FAMILY'S RESPONSE TO PLAN OF CARE: Pt stated that he  really did not want to leave the hospital, but pt son reassured pt that pt moving to residential hospice would make pt more comfortable. Pt and pt family agreeable to residential hospice placement at [facility] or [facility].       Jacklynn Lewis, MSW, LCSWA  Clinical Social Work [phone]

## 2013-08-13 NOTE — Telephone Encounter (Signed)
Called and spoke with pts wife and she stated that the pt has decided to stop all treatments and they have placed him in palliative care at the hospital and they are waiting to get a bed at [facility].   The pts wife wanted SN to know that they are doing comfort measures for the pt and she will keep [place] updated.  Nothing further is needed.

## 2013-08-13 NOTE — Progress Notes (Signed)
Report called to Stark Klein, Charity fundraiser. Patient transferring via bed to room 1317.  Family notified.

## 2013-08-14 DIAGNOSIS — R000 Tachycardia, unspecified: Secondary | ICD-10-CM

## 2013-08-14 DIAGNOSIS — D6959 Other secondary thrombocytopenia: Secondary | ICD-10-CM

## 2013-08-14 DIAGNOSIS — Z515 Encounter for palliative care: Secondary | ICD-10-CM

## 2013-08-14 DIAGNOSIS — D6481 Anemia due to antineoplastic chemotherapy: Secondary | ICD-10-CM

## 2013-08-14 DIAGNOSIS — D702 Other drug-induced agranulocytosis: Secondary | ICD-10-CM

## 2013-08-14 MED ORDER — DIAZEPAM 5 MG/ML IJ SOLN: 2.5000 mg | Freq: Every day | INTRAMUSCULAR | Status: DC

## 2013-08-14 MED ORDER — DEXAMETHASONE SODIUM PHOSPHATE 4 MG/ML IJ SOLN
4.0000 mg | Freq: Two times a day (BID) | INTRAMUSCULAR | Status: DC
  Administered 2013-08-14 (×2): 4 mg via INTRAVENOUS
  Filled 2013-08-14 (×4): qty 1

## 2013-08-14 MED ORDER — HYDROMORPHONE BOLUS VIA INFUSION
1.0000 mg | INTRAVENOUS | Status: DC | PRN
  Administered 2013-08-14: 1 mg via INTRAVENOUS
  Filled 2013-08-14: qty 1

## 2013-08-14 NOTE — Progress Notes (Addendum)
Palliative Medicine Team Progress Note  Cory [name] is extremely weak at the time of my evaluation-mostly unresponsive but is able to make eye contact and slightly nod his head. I assisted nurse in repositioning him in the bed and he experienced excruciating pain-mostly in his right arm, right neck and chest wall. He nods that his right neck is very sore. I applied hydrogel to the area and this provided relief. I have reviewed the overnight events and thoughtful assessment of his condition by Dr. Darnelle Catalan this AM. While I have not witnessed him in a more interactive state he clearly has had some lucid and valuable moments of alertness and communication with his family- especially his daughter who arrived very late last PM/early morning hours from [facility]. His daughter expresses gratitude for that time, but also expresses concern that he was agitated and not comfortable-I discussed the fragile point at which we need to sacrifice alertness for full comfort.   My assessment based on a very detailed goals of care conversation with Cory [name] yesterday was that he would never want to be in a debilitated state where he could not care for himself or was a burden on his family. He also "begged for pain relief" and was able to say that he could no longer endure his suffering and also endorsed that he understood that with a full comfort care approach and minimal medical interventions that his death would likely be immanent. We agreed to put his comfort as our first priority and I also discussed yesterday continuation of IV antibiotics, lab draws etc. He does not want to be stuck with needles, undergo any additional tests, or have his life unnecessarily prolonged. Improvement for him to a place and QOL that is acceptable to him will just not be reality in my opinion due to his complete and irreversible deconditioning in addition to his cancer. The compassionate and humane thing to do at this point would be to continue with our path of  full comfort and hospice care. Family continue to endorse a full comfort path today. I provided emotional support at the bedside and discussed care plan with RN.   Maintain Dilaudid infusion at current rate and titrate as needed with frequent bolusing for breakthrough-I also recommend bolusing prior to any movement or repositioning.  Continue PRN IV Valium with scheduled doses at PM- being used as a pain control adjuvant in addition anxiety relief-he has neuropathic pain in the right arm and muscle spasm.  Anticipate Hospital Death, his BP is 70's, HR in the 120s, respirations are shallow and irregular, sounds more congested. Not taking in meaningful nutrition.   50 minutes. Greater than 50%  of this time was spent counseling and coordinating care related to the above assessment and plan.   [name], DO Palliative Medicine

## 2013-08-14 NOTE — Progress Notes (Signed)
Cory [name]   DOB:June 08, 1929   ZO#:[id number]   JWJ#:[id number]  Subjective: patient is alert, has difficulty speaking, whispers and gestures to try to make himself understood. Denies pain. Daughter in room tells me he has been restless all night   Objective: elderly white male examined in bed Filed Vitals:   08/14/13 0552  BP: 75/43  Pulse: 126  Temp: 99.5 F (37.5 C)  Resp: 22    Body mass index is 18.89 kg/(m^2).  Intake/Output Summary (Last 24 hours) at 08/14/13 0800 Last data filed at 08/14/13 0555  Gross per 24 hour  Intake    110 ml  Output    100 ml  Net     10 ml     Lungs show rhonchi bilaterally  Heart regular rate and rhythm  Abdomen soft, +BS  Neuro nonfocal   CBG (last 3)   Recent Labs  08/12/13 0857  GLUCAP 99     Labs:  Lab Results  Component Value Date   WBC <0.1* 08/12/2013   HGB 7.3* 08/12/2013   HCT 22.1* 08/12/2013   MCV 93.6 08/12/2013   PLT 10* 08/12/2013   NEUTROABS 0.5* 08/10/2013    @LASTCHEMISTRY @  Urine Studies No results found for this basename: UACOL, UAPR, USPG, UPH, UTP, UGL, UKET, UBIL, UHGB, UNIT, UROB, ULEU, UEPI, UWBC, URBC, UBAC, CAST, CRYS, UCOM, BILUA,  in the last 72 hours  Basic Metabolic Panel:  Recent Labs Lab 08/10/13 1121 08/03/2013 2225 08/12/13 0320  NA 151* 148* 149*  K 3.6 3.4* 3.0*  CL  --  106 114*  CO2 27 29 26   GLUCOSE 152* 128* 132*  BUN 59.9* 66* 58*  CREATININE 1.4* 1.86* 1.43*  CALCIUM 9.1 9.1 8.2*   GFR Estimated Creatinine Clearance: 32.7 ml/min (by C-G formula based on Cr of 1.43). Liver Function Tests:  Recent Labs Lab 08/10/13 1121 07/24/2013 2225  AST 17 14  ALT 23 22  ALKPHOS 56 45  BILITOT 1.98* 2.0*  PROT 6.1* 5.8*  ALBUMIN 3.4* 3.0*   No results found for this basename: LIPASE, AMYLASE,  in the last 168 hours No results found for this basename: AMMONIA,  in the last 168 hours Coagulation profile  Recent Labs Lab 08/12/13 0320  INR 1.71*    CBC:  Recent Labs Lab  08/10/13 1121 07/31/2013 2225 08/12/13 0320 08/12/13 1558  WBC 0.5* <0.1* <0.1* <0.1*  NEUTROABS 0.5*  --   --   --   HGB 9.5* 9.0* 8.2* 7.3*  HCT 29.1* 27.2* 24.4* 22.1*  MCV 95.1 93.8 93.8 93.6  PLT 42* 19* 12* 10*   Cardiac Enzymes:  Recent Labs Lab 08/10/2013 2225  TROPONINI <0.30   BNP: No components found with this basename: POCBNP,  CBG:  Recent Labs Lab 08/12/13 0857  GLUCAP 99   D-Dimer No results found for this basename: DDIMER,  in the last 72 hours Hgb A1c No results found for this basename: HGBA1C,  in the last 72 hours Lipid Profile No results found for this basename: CHOL, HDL, LDLCALC, TRIG, CHOLHDL, LDLDIRECT,  in the last 72 hours Thyroid function studies No results found for this basename: TSH, T4TOTAL, FREET3, T3FREE, THYROIDAB,  in the last 72 hours Anemia work up No results found for this basename: VITAMINB12, FOLATE, FERRITIN, TIBC, IRON, RETICCTPCT,  in the last 72 hours Microbiology Recent Results (from the past 240 hour(s))  CULTURE, BLOOD (ROUTINE X 2)     Status: None   Collection Time    08/12/2013  10:25 PM      Result Value Range Status   Specimen Description BLOOD LEFT ARM   Final   Special Requests BOTTLES DRAWN AEROBIC AND ANAEROBIC   Final   Culture  Setup Time     Final   Value: 08/12/2013 01:55     Performed at [facility]   Culture     Final   Value:        BLOOD CULTURE RECEIVED NO GROWTH TO DATE CULTURE WILL BE HELD FOR 5 DAYS BEFORE ISSUING A FINAL NEGATIVE REPORT     Performed at [facility]   Report Status PENDING   Incomplete  CULTURE, BLOOD (ROUTINE X 2)     Status: None   Collection Time    08/05/2013 10:50 PM      Result Value Range Status   Specimen Description BLOOD RIGHT ANTECUBITAL   Final   Special Requests BOTTLES DRAWN AEROBIC AND ANAEROBIC   Final   Culture  Setup Time     Final   Value: 08/12/2013 03:30     Performed at [facility]   Culture     Final   Value:        BLOOD  CULTURE RECEIVED NO GROWTH TO DATE CULTURE WILL BE HELD FOR 5 DAYS BEFORE ISSUING A FINAL NEGATIVE REPORT     Performed at [facility]   Report Status PENDING   Incomplete  URINE CULTURE     Status: None   Collection Time    08/12/13  3:13 AM      Result Value Range Status   Specimen Description URINE, CATHETERIZED   Final   Special Requests Normal   Final   Culture  Setup Time     Final   Value: 08/12/2013 10:04     Performed at [facility] Count     Final   Value: NO GROWTH     Performed at [facility]   Culture     Final   Value: NO GROWTH     Performed at [facility]   Report Status 08/13/2013 FINAL   Final  MRSA PCR SCREENING     Status: None   Collection Time    08/12/13  3:37 AM      Result Value Range Status   MRSA by PCR NEGATIVE  NEGATIVE Final   Comment:            The GeneXpert MRSA Assay (FDA     approved for NASAL specimens     only), is one component of a     comprehensive MRSA colonization     surveillance program. It is not     intended to diagnose MRSA     infection nor to guide or     monitor treatment for     MRSA infections.      Studies:  Dg Chest Port 1 View  08/13/2013   *RADIOLOGY REPORT*  Clinical Data: Suspect pneumonia, history of COPD  PORTABLE CHEST - 1 VIEW  Comparison: 08/07/2013  Findings: Cardiomediastinal silhouette is stable.  No acute infiltrate or pulmonary edema.  Left costophrenic angle is not included on the film.  Mild degenerative changes thoracic spine.  IMPRESSION: No active disease.   Original Report Authenticated By: Natasha Mead, M.D.    Assessment: 79 y.o.  man 1. Small Cell Lung Cancer, s/p carboplatin/ etoposide chemotherapy x4 cycles, last dose 08/05/2013, with concurrent radiation completed  08/10/2013 2. Adverse SE Radiation Chemotherapy: Neutropenia, Anemia, Thrombocytopenia  3. Acute Renal Failure  4. Failure to Thrive  5. Generalized Weakness and Debility  6.  Hypoxia and Respiratory Failure, PNA  7. Hypokalemia  8. Agitation/Anxiety  9. Dyspnea  10. Neuropathic Pain-right side  11. Radiation Thermal Wound/Upper right Apical Skin CW  12. Right arm edema/telangiectasias  13. Alopecia  14. Tachycardia 150's  15. Hypernatremia    Plan: this patient is not moribund at present, and it is possible his apnea episodes yesterday were due to narcotics. I agree with a palliative/ supportive approach with no further chemotherapy or radiation planned. I will maximize his respiratory support and minimize his daytime psychotropics. His labwork may be improving--he is now day 10 cycle 4 last chemo dose-- and I will repeat a CBC/diff Monday. At that time it may be appropriate to resume Hospice d/c plans.  Will follow with you   Lowella Dell, MD 08/14/2013  8:00 AM

## 2013-08-16 NOTE — Progress Notes (Signed)
Vikki Ports RN

## 2013-08-16 NOTE — Progress Notes (Signed)
At [phone] Patient pale, cyanotic,  Apneaic, no breath sounds audible on auscultation, pulseless, pupils fixed and dilated. No blood pressure. Confirmed by two RN'S -D Verdie Wilms RN and J Malmfelt RN. Patient daughter at bedside & notified patient's spouse.Marland Kitchen

## 2013-08-16 NOTE — Care Management Note (Signed)
    Page 1 of 2   23-Aug-2013     12:56:39 PM   CARE MANAGEMENT NOTE 08-23-2013  Patient:  Cory [name], Cory [name]   Account Number:  [id number]  Date Initiated:  08/12/2013  Documentation initiated by:  DAVIS,RHONDA  Subjective/Objective Assessment:   pt with hx of lung ca and recent radiation now with fevers and low wbc and platlets counts     Action/Plan:   from home with wife   Anticipated DC Date:  08/23/13   Anticipated DC Plan:  EXPIRED  In-house referral  NA      DC Planning Services  CM consult      PAC Choice  NA   Choice offered to / List presented to:  NA   DME arranged  NA      DME agency  NA     HH arranged  NA      HH agency  NA   Status of service:  Completed, signed off Medicare Important Message given?  NA - LOS <3 / Initial given by admissions (If response is "NO", the following Medicare IM given date fields will be blank) Date Medicare IM given:   Date Additional Medicare IM given:    Discharge Disposition:  EXPIRED  Per UR Regulation:  Reviewed for med. necessity/level of care/duration of stay  If discussed at Long Length of Stay Meetings, dates discussed:    Comments:  August 23, 2013 Lanier Clam RN,BSN N[facility] WEEKEND [phone] EXPIRED.  [id number] Earlene Plater, RN, BSN, [place] [phone] Chart Reviewed for discharge and hospital needs. Discharge needs at time of review:  None Review of patient progress due on [id number].

## 2013-08-16 NOTE — Progress Notes (Signed)
34mL Dilaudid gtt 0.5mg /mL wasted in pyxis with second RN, Gwenyth Bouillon following patient's death. Angelena Form, RN

## 2013-08-16 DEATH — deceased

## 2013-08-17 ENCOUNTER — Other Ambulatory Visit: Payer: Medicare Other | Admitting: Lab

## 2013-08-17 ENCOUNTER — Other Ambulatory Visit: Payer: Medicare Other

## 2013-08-17 NOTE — ED Provider Notes (Signed)
Shared service with midlevel provider. I have personally seen and examined the patient, providing direct face to face care, presenting with the chief complaint of weakness. Physical exam findings include purpura, ecchymoses, rhonchus breath sounds. Noted to be neutropenic with the fever. Plan will be to admit, pan cultured, broad spectrum antibiotics started. I have reviewed the nursing documentation on past medical history, family history, and social history.    Derwood Kaplan, MD 08/17/13 [phone]

## 2013-08-18 LAB — CULTURE, BLOOD (ROUTINE X 2): Culture: NO GROWTH

## 2013-08-20 ENCOUNTER — Ambulatory Visit (HOSPITAL_COMMUNITY): Payer: Medicare Other

## 2013-08-24 ENCOUNTER — Other Ambulatory Visit: Payer: Medicare Other | Admitting: Lab

## 2013-08-24 ENCOUNTER — Ambulatory Visit: Payer: Medicare Other | Admitting: Internal Medicine

## 2013-08-27 ENCOUNTER — Ambulatory Visit: Payer: Medicare Other

## 2013-08-28 NOTE — Progress Notes (Signed)
  Radiation Oncology         [phone] ________________________________  Name: Cory [name] MRN: [id number]  Date: 08/10/2013  DOB: 05-Jun-1929  End of Treatment Note  Diagnosis:   Small cell lung cancer     Indication for treatment:  Curative       Radiation treatment dates:   06/15/2013 through 08/10/2013  Site/dose:   The patient was treated to the gross disease within the chest including right paratracheal involvement and supraclavicular adenopathy. The patient received 63 gray at 1.8 gray per fraction. This was carried out using a IM technique with daily image guidance.RT   Narrative: The patient tolerated radiation treatment relatively well during much of his treatment. At the end of his treatment however, the patient's oral intake appeared to markedly decrease. His lab work also showed significant cytopenias. The patient refused hospitalization at that time. His care was coordinated with medical oncology and he was given IV fluids at the end of treatment.   Plan: The patient has completed radiation treatment. The patient will return to radiation oncology clinic for routine followup in one month. I advised the patient to call or return sooner if they have any questions or concerns related to their recovery or treatment. ________________________________  Radene Gunning, M.D., Ph.D.

## 2013-08-31 ENCOUNTER — Other Ambulatory Visit: Payer: Medicare Other

## 2013-09-10 ENCOUNTER — Ambulatory Visit: Payer: Medicare Other | Admitting: Pulmonary Disease

## 2013-09-15 NOTE — Discharge Summary (Signed)
Death Summary  Cory [name] ZOX:[id number] DOB: 1929/08/11 DOA: 2013/09/10  PCP: Michele Mcalpine, MD   Admit date: Sep 10, 2013 Date of Death: 10/08/2013  Final Diagnoses:    Acute Respiratory Failure   Neutropenic sepsis   COPD   Lung cancer   Thrombocytopenia   Oral thrush   AKI (acute kidney injury)   Dermatitis   Protein-calorie malnutrition, severe  History of present illness:  Cory [name] is a 79 y.o. male with Past medical history of small cell lung cancer, active smoker, undergoing radiation therapy recently completed, COPD.  The history has been provided by both patient and his wife.  As per the patient he started having shortness of breath and cough today without any chest pain, palpitation, dizziness, lightheadedness.  As per the wife since last few days he has been acting confused and forgetful. Today he started having sudden worsen off shortness of breath with raspy cough and fever which brought him in. He developed gradually worsening poor intake, and generalized weakness.  Patient denies any nausea or aspiration. He mention he had some runny nose since last few days, with white sputum and cough.  Patient denies any fall trauma or injury.  He does complain of pain in his right elbow which he mentions has been ongoing since last few days.  As per the wife the patient has been not taking any medication since last 4-5 days   Hospital Course:  [name] is an 79 yo with small cell lung cancer who on 8/26 completed 35 fractions of radiation for lung cancer and completed a full cycle of systemic chemotherapy and was admitted the next day with confusion, agitation, uncontrolled pain and respiratory failure with a fever to 101, profound neutropenia, renal failure. For past couple of days he started refusing all interventions stating that he wanted no additional treatment and only wanted pain relief, Palliative medicine team was consulted. Despite medical treatment with Abx and  supportive care he declined and was then started on Dilaudid drip per Palliative for symptom management, subsequently expired 8/31    Time: 35  Signed:  Taelynn Mcelhannon  [facility] 10-08-2013, 12:33 PM

## 2013-09-15 DEATH — deceased

## 2013-09-16 ENCOUNTER — Ambulatory Visit: Payer: Self-pay | Admitting: Radiation Oncology

## 2014-07-07 IMAGING — CR DG CHEST 2V
2 series · 2 of 2 positions shown · non-contrast
Comparison: 08/04/2013

CLINICAL DATA: Weakness

CHEST - 2 VIEW

[x chest ap]
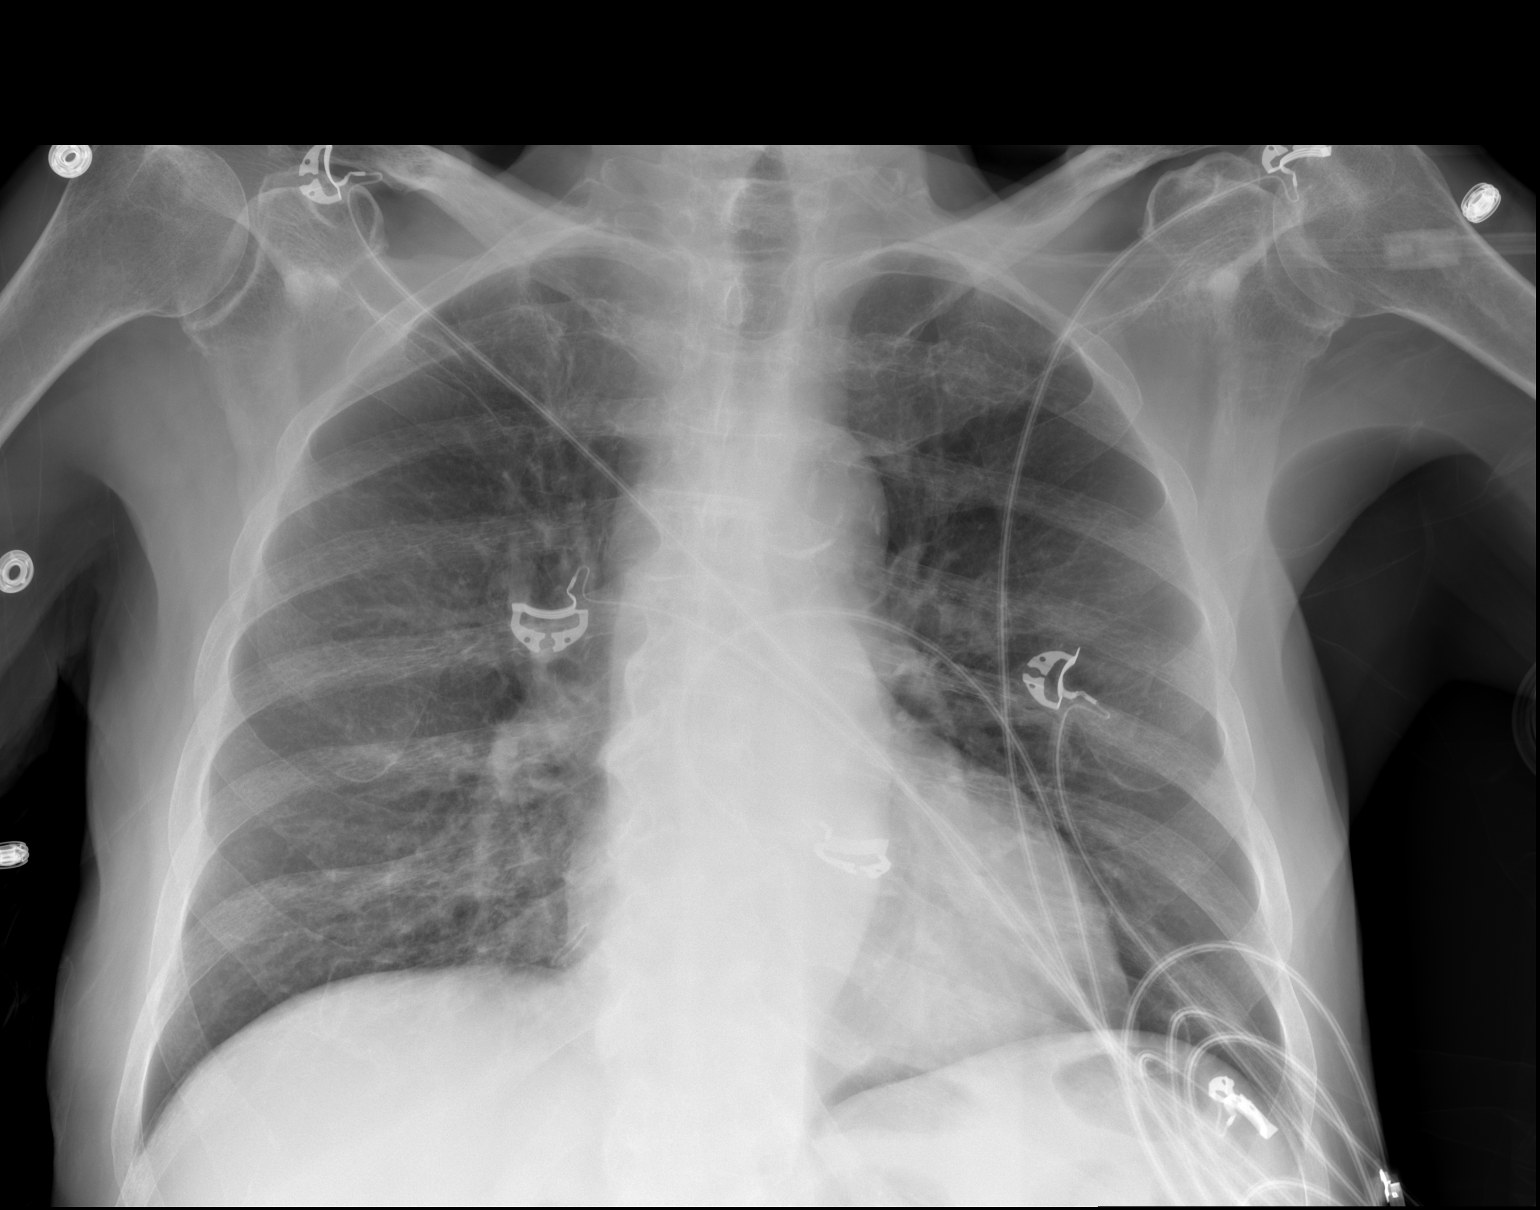

[w chest lat]
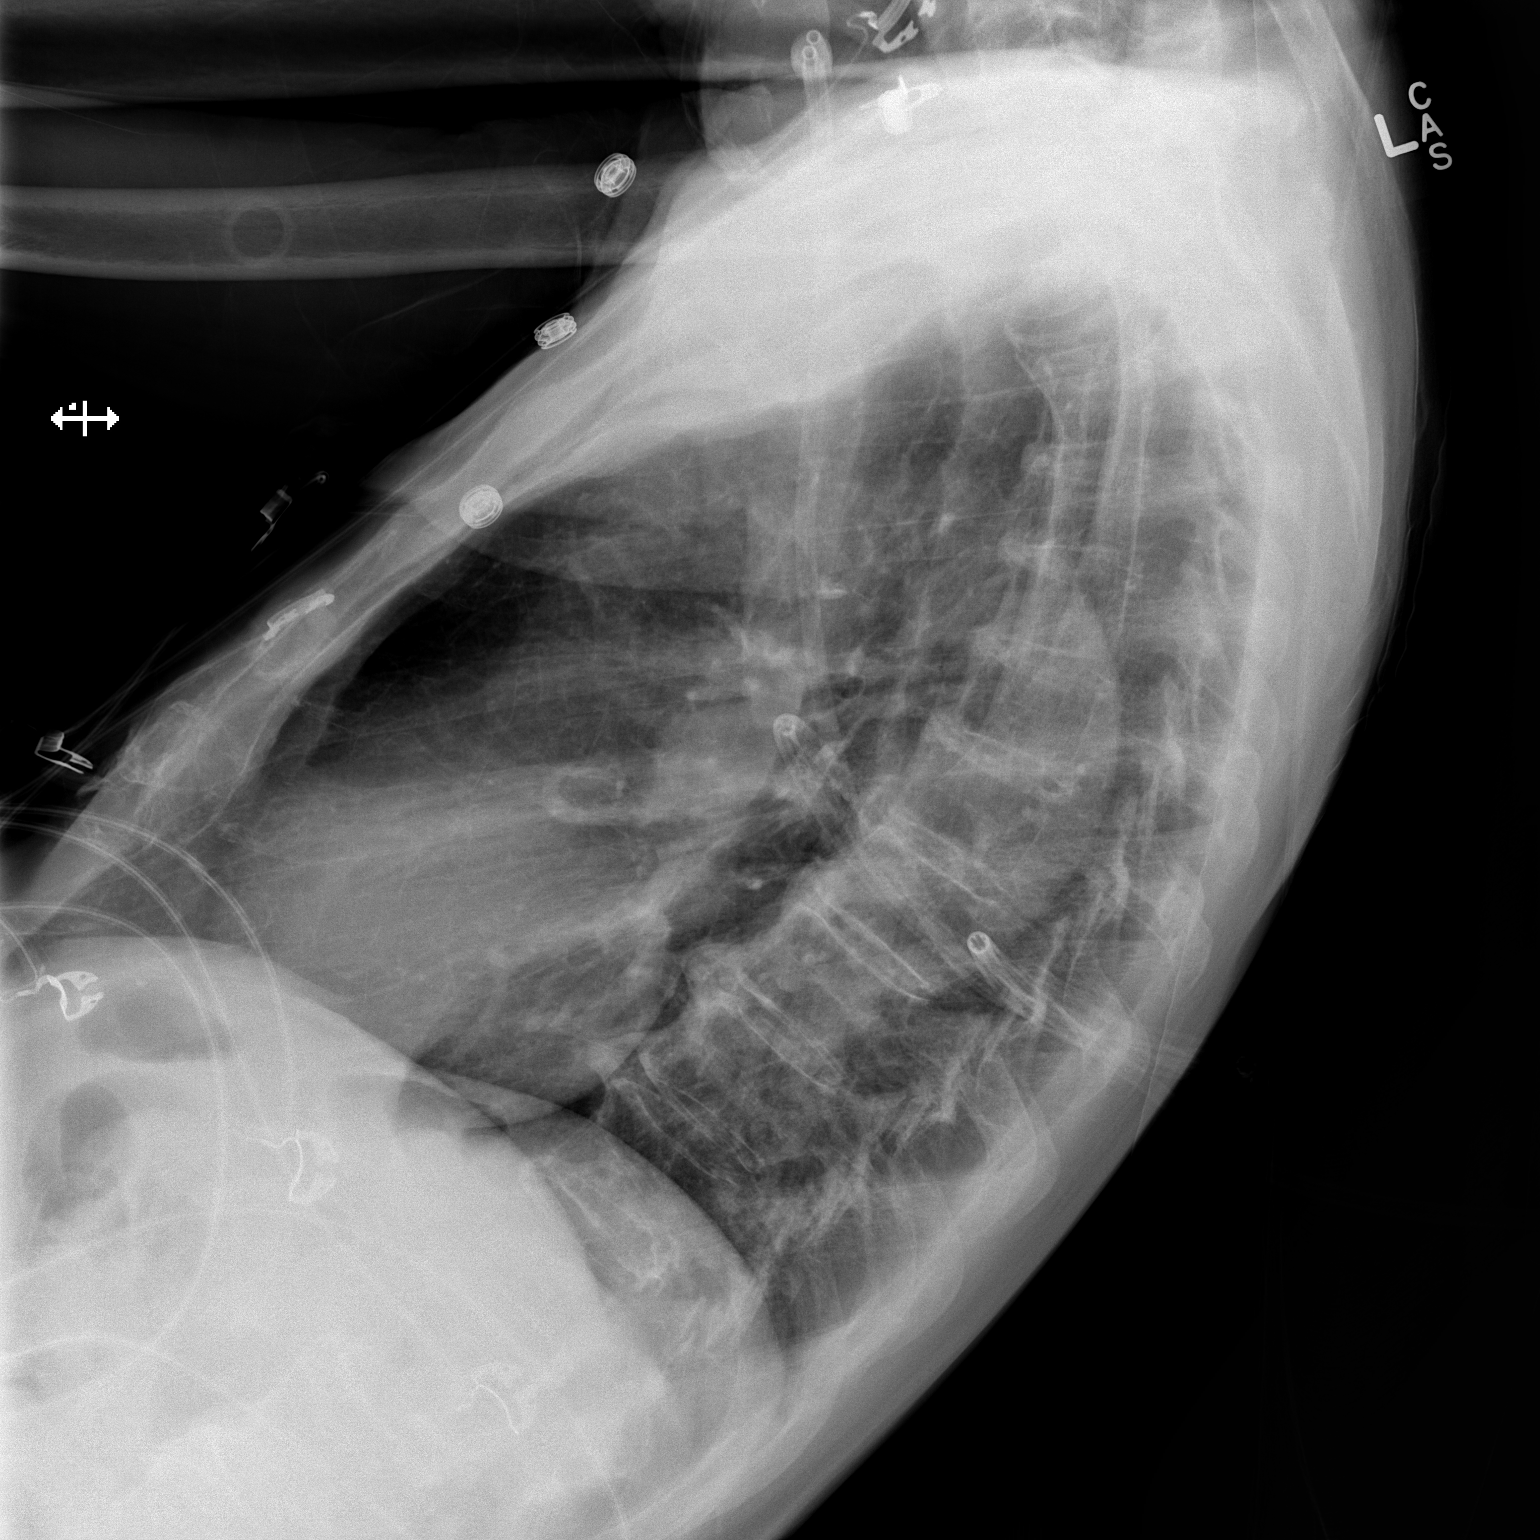

[2 of 2 positions shown; findings below may reference images not displayed]

FINDINGS: The heart and pulmonary vascularity are within normal
limits.  The lungs are clear bilaterally.  No acute bony
abnormality is seen.
IMPRESSION: No acute abnormality is noted.

## 2014-07-09 IMAGING — CR DG CHEST 1V PORT
1 series · 1 of 1 positions shown · non-contrast
Comparison: 08/11/2013

CLINICAL DATA: Suspect pneumonia, history of COPD

PORTABLE CHEST - 1 VIEW

[AP]
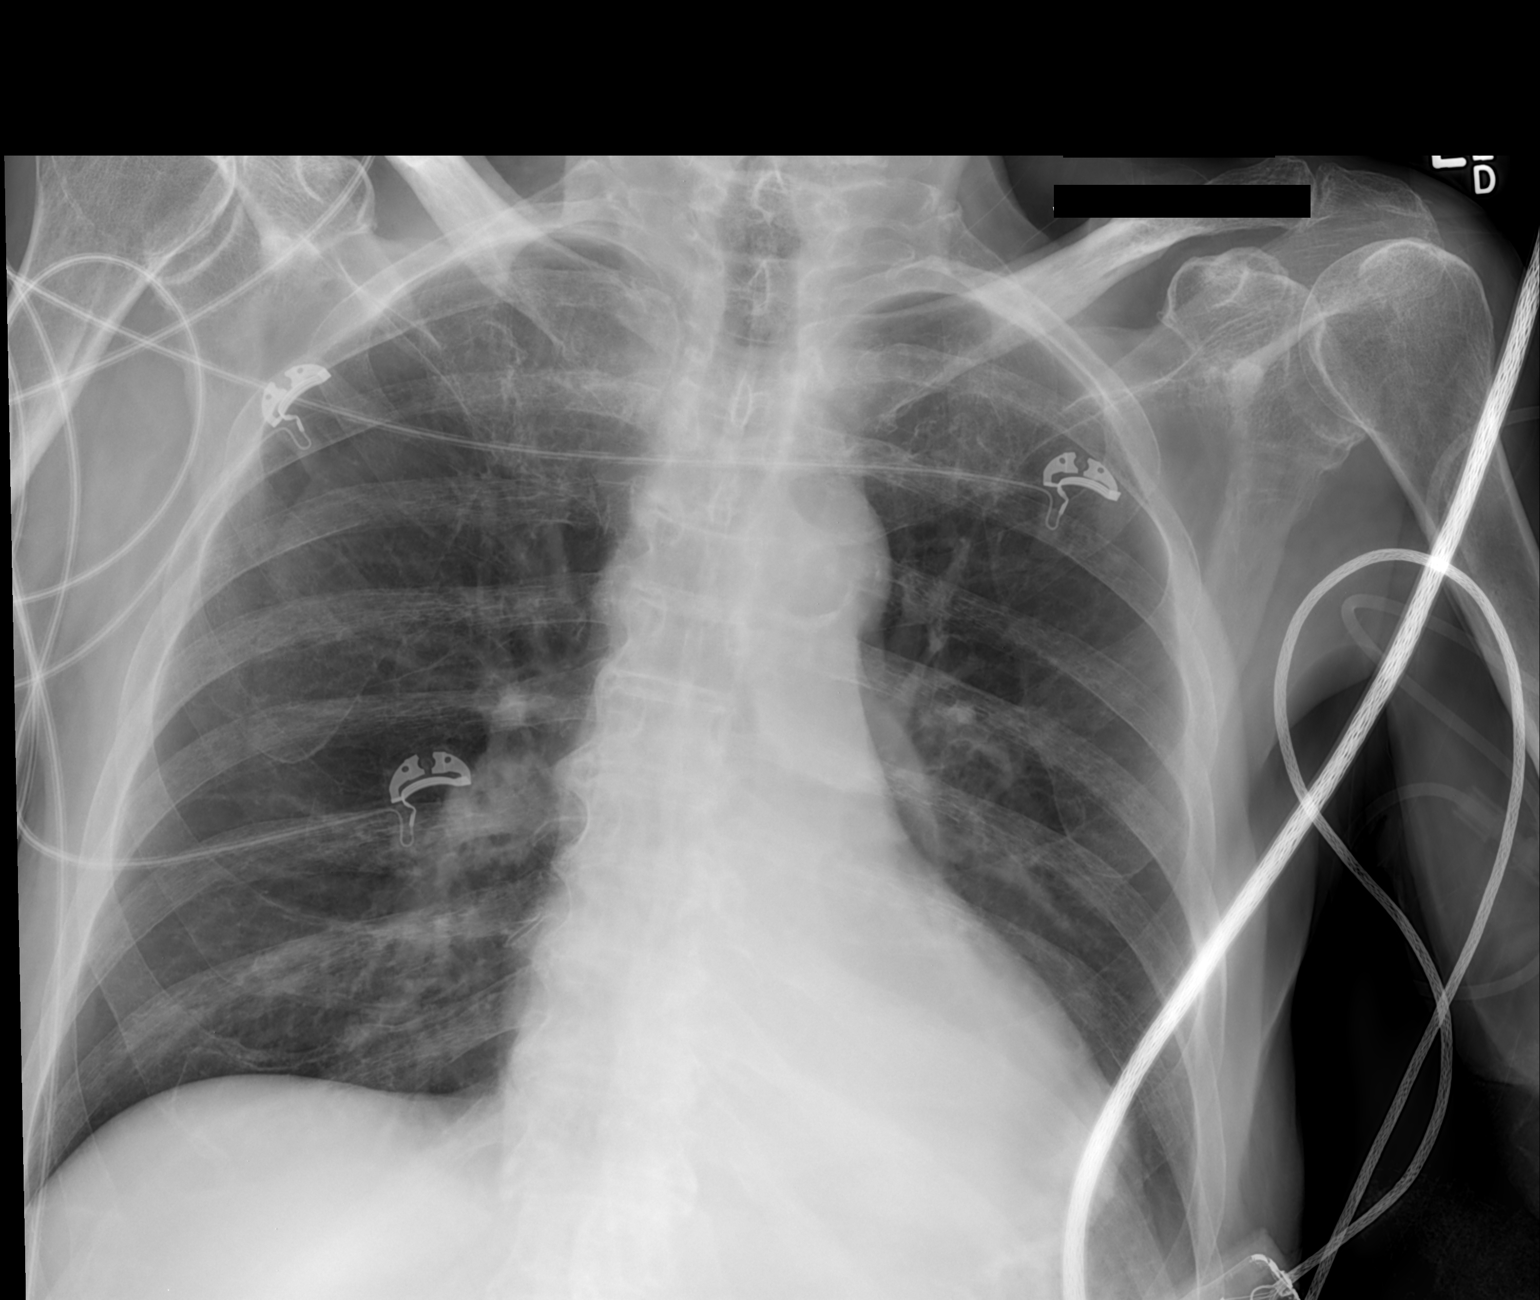

[1 of 1 positions shown; findings below may reference images not displayed]

FINDINGS: Cardiomediastinal silhouette is stable.  No acute
infiltrate or pulmonary edema.  Left costophrenic angle is not
included on the film.  Mild degenerative changes thoracic spine.
IMPRESSION: No active disease.

## 2014-08-12 ENCOUNTER — Other Ambulatory Visit: Payer: Self-pay | Admitting: Pharmacist

## 2021-08-16 DEATH — deceased
